# Patient Record
Sex: Male | Born: 1960 | Race: White | Hispanic: No | Marital: Married
Health system: Southern US, Community
[De-identification: ages and names within clinical notes are randomized; demographics above are authoritative.]

---

## 2015-12-11 ENCOUNTER — Inpatient Hospital Stay
Admission: RE | Admit: 2015-12-11 | Discharge: 2015-12-27 | Disposition: A | Discharge status: Rehabilitation Facility | Payer: BLUE CROSS/BLUE SHIELD | Source: Other Acute Inpatient Hospital | Attending: Internal Medicine | Admitting: Internal Medicine

## 2015-12-11 ENCOUNTER — Other Ambulatory Visit (HOSPITAL_COMMUNITY): Payer: BLUE CROSS/BLUE SHIELD

## 2015-12-11 DIAGNOSIS — J9690 Respiratory failure, unspecified, unspecified whether with hypoxia or hypercapnia: Secondary | ICD-10-CM

## 2015-12-11 DIAGNOSIS — J189 Pneumonia, unspecified organism: Secondary | ICD-10-CM

## 2015-12-11 DIAGNOSIS — Z931 Gastrostomy status: Secondary | ICD-10-CM

## 2015-12-11 DIAGNOSIS — R109 Unspecified abdominal pain: Secondary | ICD-10-CM

## 2015-12-11 MED ORDER — DIATRIZOATE MEGLUMINE & SODIUM 66-10 % PO SOLN
ORAL | Status: AC
  Administered 2015-12-11: 30 mL via ORAL
  Filled 2015-12-11: qty 30

## 2015-12-12 ENCOUNTER — Other Ambulatory Visit (HOSPITAL_COMMUNITY): Payer: BLUE CROSS/BLUE SHIELD

## 2015-12-12 LAB — BLOOD GAS, ARTERIAL
Acid-Base Excess: 3.7 mmol/L — ABNORMAL HIGH (ref 0.0–2.0)
Bicarbonate: 27.6 mmol/L (ref 20.0–28.0)
Drawn by: 290171
FIO2: 28
O2 Saturation: 97.9 %
PATIENT TEMPERATURE: 98.6
PH ART: 7.444 (ref 7.350–7.450)
PO2 ART: 104 mmHg (ref 83.0–108.0)
pCO2 arterial: 40.9 mmHg (ref 32.0–48.0)

## 2015-12-12 LAB — PROTIME-INR
INR: 1.13
PROTHROMBIN TIME: 14.5 s (ref 11.4–15.2)

## 2015-12-12 LAB — COMPREHENSIVE METABOLIC PANEL
ALK PHOS: 197 U/L — AB (ref 38–126)
ALT: 185 U/L — ABNORMAL HIGH (ref 17–63)
ANION GAP: 8 (ref 5–15)
AST: 84 U/L — ABNORMAL HIGH (ref 15–41)
Albumin: 2 g/dL — ABNORMAL LOW (ref 3.5–5.0)
BUN: 17 mg/dL (ref 6–20)
CALCIUM: 8.4 mg/dL — AB (ref 8.9–10.3)
CHLORIDE: 99 mmol/L — AB (ref 101–111)
CO2: 28 mmol/L (ref 22–32)
CREATININE: 0.59 mg/dL — AB (ref 0.61–1.24)
Glucose, Bld: 189 mg/dL — ABNORMAL HIGH (ref 65–99)
Potassium: 4.5 mmol/L (ref 3.5–5.1)
SODIUM: 135 mmol/L (ref 135–145)
Total Bilirubin: 0.4 mg/dL (ref 0.3–1.2)
Total Protein: 7 g/dL (ref 6.5–8.1)

## 2015-12-12 LAB — CBC
HCT: 29.6 % — ABNORMAL LOW (ref 39.0–52.0)
HEMOGLOBIN: 9 g/dL — AB (ref 13.0–17.0)
MCH: 26.9 pg (ref 26.0–34.0)
MCHC: 30.4 g/dL (ref 30.0–36.0)
MCV: 88.4 fL (ref 78.0–100.0)
PLATELETS: 341 10*3/uL (ref 150–400)
RBC: 3.35 MIL/uL — AB (ref 4.22–5.81)
RDW: 14.4 % (ref 11.5–15.5)
WBC: 10 10*3/uL (ref 4.0–10.5)

## 2015-12-13 ENCOUNTER — Other Ambulatory Visit (HOSPITAL_COMMUNITY): Payer: BLUE CROSS/BLUE SHIELD

## 2015-12-14 ENCOUNTER — Other Ambulatory Visit (HOSPITAL_COMMUNITY): Payer: BLUE CROSS/BLUE SHIELD

## 2015-12-14 LAB — BASIC METABOLIC PANEL
Anion gap: 8 (ref 5–15)
BUN: 15 mg/dL (ref 6–20)
CO2: 31 mmol/L (ref 22–32)
Calcium: 8.3 mg/dL — ABNORMAL LOW (ref 8.9–10.3)
Chloride: 96 mmol/L — ABNORMAL LOW (ref 101–111)
Creatinine, Ser: 0.71 mg/dL (ref 0.61–1.24)
GFR calc Af Amer: >60 mL/min (ref >60)
GFR calc non Af Amer: >60 mL/min (ref >60)
Glucose, Bld: 215 mg/dL — ABNORMAL HIGH (ref 65–99)
Potassium: 4.2 mmol/L (ref 3.5–5.1)
Sodium: 135 mmol/L (ref 135–145)

## 2015-12-14 LAB — CBC
HCT: 30.3 % — ABNORMAL LOW (ref 39.0–52.0)
HEMOGLOBIN: 9.1 g/dL — AB (ref 13.0–17.0)
MCH: 26.8 pg (ref 26.0–34.0)
MCHC: 30 g/dL (ref 30.0–36.0)
MCV: 89.4 fL (ref 78.0–100.0)
Platelets: 396 10*3/uL (ref 150–400)
RBC: 3.39 MIL/uL — ABNORMAL LOW (ref 4.22–5.81)
RDW: 14.8 % (ref 11.5–15.5)
WBC: 9.9 10*3/uL (ref 4.0–10.5)

## 2015-12-14 LAB — MAGNESIUM: MAGNESIUM: 2.2 mg/dL (ref 1.7–2.4)

## 2015-12-14 LAB — PHOSPHORUS: PHOSPHORUS: 4.2 mg/dL (ref 2.5–4.6)

## 2015-12-16 LAB — BASIC METABOLIC PANEL
ANION GAP: 9 (ref 5–15)
BUN: 11 mg/dL (ref 6–20)
CALCIUM: 8.7 mg/dL — AB (ref 8.9–10.3)
CO2: 29 mmol/L (ref 22–32)
Chloride: 93 mmol/L — ABNORMAL LOW (ref 101–111)
Creatinine, Ser: 0.71 mg/dL (ref 0.61–1.24)
GLUCOSE: 194 mg/dL — AB (ref 65–99)
Potassium: 4.4 mmol/L (ref 3.5–5.1)
SODIUM: 131 mmol/L — AB (ref 135–145)

## 2015-12-16 LAB — CBC
HCT: 31.8 % — ABNORMAL LOW (ref 39.0–52.0)
Hemoglobin: 9.8 g/dL — ABNORMAL LOW (ref 13.0–17.0)
MCH: 26.9 pg (ref 26.0–34.0)
MCHC: 30.8 g/dL (ref 30.0–36.0)
MCV: 87.4 fL (ref 78.0–100.0)
PLATELETS: 366 10*3/uL (ref 150–400)
RBC: 3.64 MIL/uL — AB (ref 4.22–5.81)
RDW: 14.9 % (ref 11.5–15.5)
WBC: 9.9 10*3/uL (ref 4.0–10.5)

## 2015-12-16 LAB — MAGNESIUM: MAGNESIUM: 2 mg/dL (ref 1.7–2.4)

## 2015-12-17 LAB — PHOSPHORUS: Phosphorus: 4.6 mg/dL (ref 2.5–4.6)

## 2015-12-17 LAB — CBC WITH DIFFERENTIAL/PLATELET
BASOS ABS: 0 10*3/uL (ref 0.0–0.1)
Basophils Relative: 0 %
EOS PCT: 2 %
Eosinophils Absolute: 0.2 10*3/uL (ref 0.0–0.7)
HCT: 32.9 % — ABNORMAL LOW (ref 39.0–52.0)
Hemoglobin: 10.2 g/dL — ABNORMAL LOW (ref 13.0–17.0)
LYMPHS ABS: 2.7 10*3/uL (ref 0.7–4.0)
LYMPHS PCT: 25 %
MCH: 27.1 pg (ref 26.0–34.0)
MCHC: 31 g/dL (ref 30.0–36.0)
MCV: 87.3 fL (ref 78.0–100.0)
MONO ABS: 0.8 10*3/uL (ref 0.1–1.0)
Monocytes Relative: 7 %
Neutro Abs: 7 10*3/uL (ref 1.7–7.7)
Neutrophils Relative %: 66 %
PLATELETS: 409 10*3/uL — AB (ref 150–400)
RBC: 3.77 MIL/uL — ABNORMAL LOW (ref 4.22–5.81)
RDW: 14.8 % (ref 11.5–15.5)
WBC: 10.7 10*3/uL — ABNORMAL HIGH (ref 4.0–10.5)

## 2015-12-17 LAB — BASIC METABOLIC PANEL
Anion gap: 9 (ref 5–15)
BUN: 18 mg/dL (ref 6–20)
CALCIUM: 8.6 mg/dL — AB (ref 8.9–10.3)
CO2: 28 mmol/L (ref 22–32)
CREATININE: 0.67 mg/dL (ref 0.61–1.24)
Chloride: 94 mmol/L — ABNORMAL LOW (ref 101–111)
GFR calc Af Amer: >60 mL/min (ref >60)
GLUCOSE: 221 mg/dL — AB (ref 65–99)
Potassium: 4.4 mmol/L (ref 3.5–5.1)
Sodium: 131 mmol/L — ABNORMAL LOW (ref 135–145)

## 2015-12-17 LAB — MAGNESIUM: Magnesium: 2.2 mg/dL (ref 1.7–2.4)

## 2015-12-18 ENCOUNTER — Other Ambulatory Visit (HOSPITAL_COMMUNITY): Payer: BLUE CROSS/BLUE SHIELD

## 2015-12-18 LAB — CBC WITH DIFFERENTIAL/PLATELET
BASOS ABS: 0 10*3/uL (ref 0.0–0.1)
BASOS PCT: 0 %
EOS PCT: 2 %
Eosinophils Absolute: 0.3 10*3/uL (ref 0.0–0.7)
HEMATOCRIT: 35.1 % — AB (ref 39.0–52.0)
Hemoglobin: 10.9 g/dL — ABNORMAL LOW (ref 13.0–17.0)
LYMPHS PCT: 26 %
Lymphs Abs: 3 10*3/uL (ref 0.7–4.0)
MCH: 27.2 pg (ref 26.0–34.0)
MCHC: 31.1 g/dL (ref 30.0–36.0)
MCV: 87.5 fL (ref 78.0–100.0)
Monocytes Absolute: 0.8 10*3/uL (ref 0.1–1.0)
Monocytes Relative: 7 %
NEUTROS ABS: 7.6 10*3/uL (ref 1.7–7.7)
Neutrophils Relative %: 65 %
PLATELETS: 421 10*3/uL — AB (ref 150–400)
RBC: 4.01 MIL/uL — AB (ref 4.22–5.81)
RDW: 14.8 % (ref 11.5–15.5)
WBC: 11.8 10*3/uL — AB (ref 4.0–10.5)

## 2015-12-18 LAB — BASIC METABOLIC PANEL
ANION GAP: 8 (ref 5–15)
BUN: 17 mg/dL (ref 6–20)
CO2: 31 mmol/L (ref 22–32)
Calcium: 9.1 mg/dL (ref 8.9–10.3)
Chloride: 96 mmol/L — ABNORMAL LOW (ref 101–111)
Creatinine, Ser: 0.67 mg/dL (ref 0.61–1.24)
GLUCOSE: 110 mg/dL — AB (ref 65–99)
POTASSIUM: 4.8 mmol/L (ref 3.5–5.1)
Sodium: 135 mmol/L (ref 135–145)

## 2015-12-18 LAB — MAGNESIUM: Magnesium: 2.1 mg/dL (ref 1.7–2.4)

## 2015-12-18 LAB — PHOSPHORUS: PHOSPHORUS: 4.1 mg/dL (ref 2.5–4.6)

## 2015-12-19 LAB — CULTURE, RESPIRATORY W GRAM STAIN

## 2015-12-19 LAB — CULTURE, RESPIRATORY

## 2015-12-24 LAB — CBC WITH DIFFERENTIAL/PLATELET
BASOS PCT: 0 %
Basophils Absolute: 0 10*3/uL (ref 0.0–0.1)
Eosinophils Absolute: 0.3 10*3/uL (ref 0.0–0.7)
Eosinophils Relative: 4 %
HEMATOCRIT: 33.7 % — AB (ref 39.0–52.0)
Hemoglobin: 10.5 g/dL — ABNORMAL LOW (ref 13.0–17.0)
LYMPHS PCT: 36 %
Lymphs Abs: 2.8 10*3/uL (ref 0.7–4.0)
MCH: 27.2 pg (ref 26.0–34.0)
MCHC: 31.2 g/dL (ref 30.0–36.0)
MCV: 87.3 fL (ref 78.0–100.0)
MONO ABS: 0.6 10*3/uL (ref 0.1–1.0)
MONOS PCT: 7 %
NEUTROS ABS: 4.1 10*3/uL (ref 1.7–7.7)
Neutrophils Relative %: 53 %
Platelets: 329 10*3/uL (ref 150–400)
RBC: 3.86 MIL/uL — ABNORMAL LOW (ref 4.22–5.81)
RDW: 15.4 % (ref 11.5–15.5)
WBC: 7.8 10*3/uL (ref 4.0–10.5)

## 2015-12-24 LAB — BASIC METABOLIC PANEL
Anion gap: 9 (ref 5–15)
BUN: 19 mg/dL (ref 6–20)
CHLORIDE: 93 mmol/L — AB (ref 101–111)
CO2: 29 mmol/L (ref 22–32)
CREATININE: 0.75 mg/dL (ref 0.61–1.24)
Calcium: 9 mg/dL (ref 8.9–10.3)
GFR calc Af Amer: >60 mL/min (ref >60)
GFR calc non Af Amer: >60 mL/min (ref >60)
Glucose, Bld: 189 mg/dL — ABNORMAL HIGH (ref 65–99)
Potassium: 5 mmol/L (ref 3.5–5.1)
Sodium: 131 mmol/L — ABNORMAL LOW (ref 135–145)

## 2015-12-24 LAB — PHOSPHORUS: Phosphorus: 4.9 mg/dL — ABNORMAL HIGH (ref 2.5–4.6)

## 2015-12-24 LAB — MAGNESIUM: Magnesium: 2 mg/dL (ref 1.7–2.4)

## 2015-12-26 LAB — BASIC METABOLIC PANEL
ANION GAP: 9 (ref 5–15)
BUN: 20 mg/dL (ref 6–20)
CALCIUM: 8.8 mg/dL — AB (ref 8.9–10.3)
CO2: 25 mmol/L (ref 22–32)
Chloride: 95 mmol/L — ABNORMAL LOW (ref 101–111)
Creatinine, Ser: 0.8 mg/dL (ref 0.61–1.24)
GFR calc Af Amer: >60 mL/min (ref >60)
Glucose, Bld: 185 mg/dL — ABNORMAL HIGH (ref 65–99)
POTASSIUM: 4.8 mmol/L (ref 3.5–5.1)
SODIUM: 129 mmol/L — AB (ref 135–145)

## 2015-12-26 LAB — CBC
HCT: 34.8 % — ABNORMAL LOW (ref 39.0–52.0)
Hemoglobin: 10.8 g/dL — ABNORMAL LOW (ref 13.0–17.0)
MCH: 26.7 pg (ref 26.0–34.0)
MCHC: 31 g/dL (ref 30.0–36.0)
MCV: 86.1 fL (ref 78.0–100.0)
PLATELETS: 304 10*3/uL (ref 150–400)
RBC: 4.04 MIL/uL — AB (ref 4.22–5.81)
RDW: 15.2 % (ref 11.5–15.5)
WBC: 8 10*3/uL (ref 4.0–10.5)

## 2015-12-26 LAB — MAGNESIUM: MAGNESIUM: 2 mg/dL (ref 1.7–2.4)

## 2015-12-26 LAB — PHOSPHORUS: Phosphorus: 4.9 mg/dL — ABNORMAL HIGH (ref 2.5–4.6)

## 2015-12-26 NOTE — PMR Pre-admission (Signed)
Secondary Market PMR Admission Coordinator Pre-Admission Assessment  Patient: James Holden is an 55 y.o., male MRN: [id number] DOB: Sep 10, 1960 Height: 5' 9"  (175.3 cm) Weight: 91.5 kg (201 lb 11.2 oz)  Insurance Information HMO:     PPO: Yes     PCP:       IPA:       80/20:       OTHER:   PRIMARY: Rickey Primus      Policy#: [id number]      Subscriber: Brett Albino CM Name: Durene Fruits      Phone#: [phone]     Fax#: [phone] Pre-Cert#: [id number] X 14 days      Employer: FT truck driver locally Benefits:  Phone #: [phone]     Name: Wendy Poet. Date: 02/17/15     Deduct:  $1100 (met $1100)      Out of Pocket Max:  $5500 (met $3321.25)      Life Max: Unlimited CIR: 80%      SNF: 80% with 60 days max  Outpatient: 80% with 60 visits combined     Co-Pay: 20% Home Health: 80%      Co-Pay: 20% DME: 80%     Co-Pay: 20% Providers: in network  SECONDARY: Aet[employer]      Policy#: [id number]      Subscriber:  Veneda Melter CM Name:        Phone#:       Fax#:   Pre-Cert#: Not required with BCBS primary      Employer:  FT paralegal Benefits:  Phone #: [phone]     Name: Delice Lesch. Date: 10/18/15     Deduct: $0      Out of Pocket Max: $3000 (met $454.53)      Life Max: None CIR: 80%      SNF: 80% Outpatient: 90 visits combined     Co-Pay: $20/visit Home Health: 80% with 120 visits max      Co-Pay: 20% DME: 80%     Co-Pay: 62%  Medicaid Application Date:        Case Manager:   Disability Application Date:        Case Worker:    Emergency Contact Information Contact Information    Name Relation Home Work Mobile   Cerrito,Rebecca Spouse [phone]     [facility] Son   [phone]      Current Medical History  Patient Admitting Diagnosis:  R MCA infarct w/craniotomy  History of Present Illness: A 56 yo male initially presented to the emergency department via EMS at [facility] on 11/14/15 with a large right MCA stroke.  Initial NIH stroke scale was 16.  He received IV TPA  immediately after his initial noncontrast head CT was negative for hemorrhagic stroke.  Shortly after TPA, he was transferred to interventional radiology where they performed a successful embolectomy.  He was on a cardene drip in the ICU for hypertensive emergency.  He developed an aspiration pneumonia requiring intubation, mechanical ventilation as well as cerebral edema.  He underwent a hemicraniotomy on 11/15/15.  He was started on hypertonic saline for his cerebral edema.  He was trached on 12/03/15 and PEG was placed on 12/05/15.  He was weaned from the vent to trach collar on 12/09/15.  Patient was admitted to [facility] on 12/11/15.  He was decannulated on 12/25/15.  Nocturnal tube feedings were discontinued 12/26/15 and he is currently on a dysphagia 2, nectar thick liquids diet.  He was evaluated by  PT/OT/SLP with ongoing treatments.  Therapies have recommended acute inpatient rehab admission to further his rehabilitation.  NIH stroke scale=initially 16  Patient's medical record from [facility] has been reviewed by the rehabilitation admission coordinator and physician.  Past Medical History  Depression Obesity Type 2 diabetes Hypertension Tobacco use Chronic low back pain  Family History   family history is not on file.  Prior Rehab/Hospitalizations Has the patient had major surgery during 100 days prior to admission? No   Current Medications See MAR from [facility]  Patients Current Diet:   Dysphagia 2, nectar thick liquids  Precautions / Restrictions Precautions Precautions: Fall Precautions/Special Needs: Swallowing   Has the patient had 2 or more falls or a fall with injury in the past year?No  Prior Activity Level Community (5-7x/wk): Went out daily.  Worked FT as a Engineer, drilling.  Likes to play music with his son.  Prior Functional Level Self Care: Did the patient need help bathing, dressing, using the toilet or  eating?  Independent  Indoor Mobility: Did the patient need assistance with walking from room to room (with or without device)? Independent  Stairs: Did the patient need assistance with internal or external stairs (with or without device)? Independent  Functional Cognition: Did the patient need help planning regular tasks such as shopping or remembering to take medications? Independent  Home Assistive Devices / Equipment Home Assistive Devices/Equipment: None  Prior Device Use: Indicate devices/aids used by the patient prior to current illness, exacerbation or injury? None   Prior Functional Level Current Functional Level  Bed Mobility  Independent  Max assist   Transfers  Independent  Max assist   Mobility - Walk/Wheelchair  Independent  Total assist   Upper Body Dressing  Independent      Lower Body Dressing  Independent      Grooming  Independent      Eating/Drinking  Independent  Max assist   Toilet Transfer  Independent      Bladder Continence   WDL  Incontinent, condom catheter in place   Bowel Management  WDL  Last BM 12/25/15   Stair Climbing  Independent Other (Unable)   Communication  Verbally intact  Speaks slowly   Memory  Intact  Mild impairment   Cooking/Meal Prep  Independent      Housework  Independent    Money Management  Independent    Driving  Independent     Special needs/care consideration BiPAP/CPAP No CPM No Continuous Drip IV No Dialysis No      Life Vest No Oxygen No Special Bed No Trach Size Decannulated 12/25/15 Wound Vac (area) No      Skin: Scalp craniotomy incision.  Bottom is sore.  Wearing a safety helmet post hemicraniotomy.                             Bowel mgmt: Last BM 12/25/15 Bladder mgmt: Incontinent, condom catheter in use Diabetic mgmt Yes, new diabetic this admission  Previous Home Environment Living Arrangements: Spouse/significant other  Lives With: Spouse Available Help at  Discharge: Family, Available 24 hours/day Type of Home: House Home Layout: One level Home Access: Stairs to enter [employer] of Steps: 4 steps, can build ramp as needed  Discharge Living Setting Plans for Discharge Living Setting: Patient's home, House, Lives with (comment) (Lives with wife.  Son has moved back home.) Type of Home at Discharge: House Discharge Home Layout:  One level Discharge Home Access: Stairs to enter Entrance Stairs-Number of Steps: 4 steps, can build ramp as needed Does the patient have any problems obtaining your medications?: No  Social/Family/Support Systems Patient Roles: Spouse, Parent (Has a wife and a son.) Contact Information: Gerold Sar - wife Anticipated Caregiver: wife, son and a good friend retired from [place]: Wells Guiles - wife - [phone] Ability/Limitations of Caregiver: Wife works as a Radio broadcast assistant. Wife had kidney cancer earlier this year.  Son moved back from [place] and will be working.  Retired Nature conservation officer friend has offered to stay with patient while wife works post rehab. Caregiver Availability: 24/7 Discharge Plan Discussed with Primary Caregiver: Yes Is Caregiver In Agreement with Plan?: Yes Does Caregiver/Family have Issues with Lodging/Transportation while Pt is in Rehab?: No  Goals/Additional Needs Patient/Family Goal for Rehab: PT/OT min to mod assist goals, SLP supervision to min assist goals Expected length of stay: 21-24 days Cultural Considerations: [employer] Dietary Needs: Dys 2, nectar thick liquids Equipment Needs: TBD Special Service Needs: Skull flap removed for swelling and will need to be replaced at [facility] later on. Pt/Family Agrees to Admission and willing to participate: Yes (Met with son and wife on rehab unit.) Program Orientation Provided & Reviewed with Pt/Caregiver Including Roles  & Responsibilities: Yes (to wife and son.)  Patient Condition: I have  reviewed all notes from [facility].  I met with wife and son.  Patient has suffered a large MCA CVA with post op craniotomy for cerebral edema.  He will benefit from the coordinated services of the inpatient rehab program.  He needs close medical follow up by rehab MD secondary to ongoing dizziness, dysphagia, and new diagnosis of diabetes.  He can tolerate and is able to participate in 3 hours of therapy a day.  He is motivated and wants to regain functional independence so that he can return home with family.  I have discussed all progress with rehab MD and have approval for acute inpatient rehab admission for today.  Preadmission Screen Completed By:  Retta Diones, 12/27/2015 10:13 AM ______________________________________________________________________   Discussed status with Dr. Naaman Plummer on 12/27/15 at 50 and received telephone approval for admission today.  Admission Coordinator:  Retta Diones, time 1013/Date 12/27/15   Assessment/Plan: Diagnosis: large right MCA infarct 1. Does the need for close, 24 hr/day  Medical supervision in concert with the patient's rehab needs make it unreasonable for this patient to be served in a less intensive setting? Yes 2. Co-Morbidities requiring supervision/potential complications: depression, DM2, dsyphagia, htn, chronic pain 3. Due to bladder management, bowel management, safety, skin/wound care, disease management, medication administration, pain management and patient education, does the patient require 24 hr/day rehab nursing? Yes 4. Does the patient require coordinated care of a physician, rehab nurse, PT (1-2 hrs/day, 5 days/week), OT (1-2 hrs/day, 5 days/week) and SLP (1-2 hrs/day, 5 days/week) to address physical and functional deficits in the context of the above medical diagnosis(es)? Yes Addressing deficits in the following areas: balance, endurance, locomotion, strength, transferring, bowel/bladder control, bathing, dressing, feeding,  grooming, toileting, cognition, swallowing and psychosocial support 5. Can the patient actively participate in an intensive therapy program of at least 3 hrs of therapy 5 days a week? Yes 6. The potential for patient to make measurable gains while on inpatient rehab is excellent 7. Anticipated functional outcomes upon discharge from inpatients are: min assist and mod assist PT, min assist and mod assist OT, supervision SLP 8. Estimated rehab  length of stay to reach the above functional goals is: 22-25 days 9. Does the patient have adequate social supports to accommodate these discharge functional goals? Yes 10. Anticipated D/C setting: Home 11. Anticipated post D/C treatments: HH therapy and Outpatient therapy 12. Overall Rehab/Functional Prognosis: excellent    RECOMMENDATIONS: This patient's condition is appropriate for continued rehabilitative care in the following setting: CIR Patient has agreed to participate in recommended program. Yes Note that insurance prior authorization may be required for reimbursement for recommended care.  Comment: Admit to inpatient rehab today.  Meredith Staggers, MD, [facility] 12/27/2015   Retta Diones 12/27/2015

## 2015-12-27 ENCOUNTER — Inpatient Hospital Stay (HOSPITAL_COMMUNITY)
Admission: RE | Admit: 2015-12-27 | Discharge: 2016-01-28 | DRG: 092 | Disposition: A | Discharge status: Home Health | Payer: BLUE CROSS/BLUE SHIELD | Source: Intra-hospital | Attending: Physical Medicine & Rehabilitation | Admitting: Physical Medicine & Rehabilitation

## 2015-12-27 ENCOUNTER — Other Ambulatory Visit (HOSPITAL_COMMUNITY): Payer: Self-pay | Admitting: Physician Assistant

## 2015-12-27 DIAGNOSIS — E871 Hypo-osmolality and hyponatremia: Secondary | ICD-10-CM | POA: Diagnosis present

## 2015-12-27 DIAGNOSIS — R1312 Dysphagia, oropharyngeal phase: Secondary | ICD-10-CM

## 2015-12-27 DIAGNOSIS — M25532 Pain in left wrist: Secondary | ICD-10-CM | POA: Diagnosis present

## 2015-12-27 DIAGNOSIS — G8112 Spastic hemiplegia affecting left dominant side: Secondary | ICD-10-CM | POA: Diagnosis not present

## 2015-12-27 DIAGNOSIS — Z720 Tobacco use: Secondary | ICD-10-CM | POA: Diagnosis not present

## 2015-12-27 DIAGNOSIS — F4321 Adjustment disorder with depressed mood: Secondary | ICD-10-CM | POA: Diagnosis present

## 2015-12-27 DIAGNOSIS — I491 Atrial premature depolarization: Secondary | ICD-10-CM | POA: Diagnosis not present

## 2015-12-27 DIAGNOSIS — G4700 Insomnia, unspecified: Secondary | ICD-10-CM | POA: Diagnosis present

## 2015-12-27 DIAGNOSIS — G8194 Hemiplegia, unspecified affecting left nondominant side: Secondary | ICD-10-CM | POA: Diagnosis not present

## 2015-12-27 DIAGNOSIS — E1142 Type 2 diabetes mellitus with diabetic polyneuropathy: Secondary | ICD-10-CM | POA: Diagnosis present

## 2015-12-27 DIAGNOSIS — F339 Major depressive disorder, recurrent, unspecified: Secondary | ICD-10-CM

## 2015-12-27 DIAGNOSIS — I69398 Other sequelae of cerebral infarction: Secondary | ICD-10-CM | POA: Diagnosis not present

## 2015-12-27 DIAGNOSIS — R443 Hallucinations, unspecified: Secondary | ICD-10-CM | POA: Diagnosis not present

## 2015-12-27 DIAGNOSIS — M7989 Other specified soft tissue disorders: Secondary | ICD-10-CM | POA: Diagnosis not present

## 2015-12-27 DIAGNOSIS — M545 Low back pain: Secondary | ICD-10-CM | POA: Diagnosis present

## 2015-12-27 DIAGNOSIS — R2689 Other abnormalities of gait and mobility: Secondary | ICD-10-CM | POA: Diagnosis present

## 2015-12-27 DIAGNOSIS — R414 Neurologic neglect syndrome: Secondary | ICD-10-CM | POA: Diagnosis present

## 2015-12-27 DIAGNOSIS — J029 Acute pharyngitis, unspecified: Secondary | ICD-10-CM | POA: Diagnosis not present

## 2015-12-27 DIAGNOSIS — Z931 Gastrostomy status: Secondary | ICD-10-CM

## 2015-12-27 DIAGNOSIS — I69354 Hemiplegia and hemiparesis following cerebral infarction affecting left non-dominant side: Secondary | ICD-10-CM

## 2015-12-27 DIAGNOSIS — I63511 Cerebral infarction due to unspecified occlusion or stenosis of right middle cerebral artery: Secondary | ICD-10-CM | POA: Diagnosis present

## 2015-12-27 DIAGNOSIS — I69391 Dysphagia following cerebral infarction: Secondary | ICD-10-CM | POA: Diagnosis not present

## 2015-12-27 DIAGNOSIS — M79609 Pain in unspecified limb: Secondary | ICD-10-CM | POA: Diagnosis not present

## 2015-12-27 DIAGNOSIS — G2581 Restless legs syndrome: Secondary | ICD-10-CM | POA: Diagnosis present

## 2015-12-27 DIAGNOSIS — R1310 Dysphagia, unspecified: Secondary | ICD-10-CM | POA: Diagnosis present

## 2015-12-27 DIAGNOSIS — E785 Hyperlipidemia, unspecified: Secondary | ICD-10-CM | POA: Diagnosis present

## 2015-12-27 DIAGNOSIS — G8929 Other chronic pain: Secondary | ICD-10-CM | POA: Diagnosis present

## 2015-12-27 DIAGNOSIS — R269 Unspecified abnormalities of gait and mobility: Secondary | ICD-10-CM | POA: Diagnosis not present

## 2015-12-27 DIAGNOSIS — M79642 Pain in left hand: Secondary | ICD-10-CM | POA: Diagnosis present

## 2015-12-27 DIAGNOSIS — E119 Type 2 diabetes mellitus without complications: Secondary | ICD-10-CM | POA: Diagnosis not present

## 2015-12-27 DIAGNOSIS — I10 Essential (primary) hypertension: Secondary | ICD-10-CM | POA: Diagnosis not present

## 2015-12-27 DIAGNOSIS — I959 Hypotension, unspecified: Secondary | ICD-10-CM | POA: Diagnosis not present

## 2015-12-27 LAB — GLUCOSE, CAPILLARY
GLUCOSE-CAPILLARY: 178 mg/dL — AB (ref 65–99)
Glucose-Capillary: 195 mg/dL — ABNORMAL HIGH (ref 65–99)

## 2015-12-27 LAB — MRSA PCR SCREENING: MRSA by PCR: NEGATIVE

## 2015-12-27 MED ORDER — POLYETHYLENE GLYCOL 3350 17 G PO PACK
17.0000 g | PACK | Freq: Every day | ORAL | Status: DC
  Administered 2015-12-28 – 2016-01-27 (×31): 17 g via ORAL
  Filled 2015-12-27 (×32): qty 1

## 2015-12-27 MED ORDER — LISINOPRIL 20 MG PO TABS
30.0000 mg | ORAL_TABLET | Freq: Every day | ORAL | Status: DC
  Administered 2015-12-28 – 2016-01-28 (×30): 30 mg via ORAL
  Filled 2015-12-27 (×32): qty 1

## 2015-12-27 MED ORDER — ADULT MULTIVITAMIN W/MINERALS CH
1.0000 | ORAL_TABLET | Freq: Every day | ORAL | Status: DC
  Administered 2015-12-28 – 2016-01-28 (×32): 1 via ORAL
  Filled 2015-12-27 (×32): qty 1

## 2015-12-27 MED ORDER — STARCH (THICKENING) PO POWD: ORAL | Status: DC | PRN

## 2015-12-27 MED ORDER — ATORVASTATIN CALCIUM 80 MG PO TABS
80.0000 mg | ORAL_TABLET | Freq: Every day | ORAL | Status: DC
  Administered 2015-12-27 – 2016-01-27 (×32): 80 mg via ORAL
  Filled 2015-12-27 (×32): qty 1

## 2015-12-27 MED ORDER — FAMOTIDINE 20 MG PO TABS
20.0000 mg | ORAL_TABLET | Freq: Two times a day (BID) | ORAL | Status: DC
  Administered 2015-12-27 – 2016-01-28 (×64): 20 mg via ORAL
  Filled 2015-12-27 (×65): qty 1

## 2015-12-27 MED ORDER — ONDANSETRON HCL 4 MG/2ML IJ SOLN: 4.0000 mg | Freq: Four times a day (QID) | INTRAMUSCULAR | Status: DC | PRN

## 2015-12-27 MED ORDER — HYDRALAZINE HCL 10 MG PO TABS
10.0000 mg | ORAL_TABLET | Freq: Three times a day (TID) | ORAL | Status: DC
  Administered 2015-12-27 – 2016-01-05 (×17): 10 mg via ORAL
  Filled 2015-12-27 (×25): qty 1

## 2015-12-27 MED ORDER — ARIPIPRAZOLE 5 MG PO TABS
5.0000 mg | ORAL_TABLET | Freq: Every day | ORAL | Status: DC
  Administered 2015-12-28 – 2016-01-28 (×32): 5 mg via ORAL
  Filled 2015-12-27 (×31): qty 1

## 2015-12-27 MED ORDER — RESOURCE THICKENUP CLEAR PO POWD
ORAL | Status: DC | PRN
Start: 2015-12-27 — End: 2016-01-28
  Filled 2015-12-27 (×2): qty 125

## 2015-12-27 MED ORDER — FLUOXETINE HCL 20 MG PO CAPS
40.0000 mg | ORAL_CAPSULE | Freq: Every day | ORAL | Status: DC
  Administered 2015-12-28 – 2016-01-28 (×32): 40 mg via ORAL
  Filled 2015-12-27 (×33): qty 2

## 2015-12-27 MED ORDER — ACETAMINOPHEN 325 MG PO TABS
325.0000 mg | ORAL_TABLET | ORAL | Status: DC | PRN
  Administered 2015-12-27 – 2016-01-23 (×13): 650 mg via ORAL
  Filled 2015-12-27 (×15): qty 2

## 2015-12-27 MED ORDER — OXYCODONE HCL 5 MG PO TABS
5.0000 mg | ORAL_TABLET | Freq: Four times a day (QID) | ORAL | Status: DC | PRN
  Administered 2015-12-27 – 2016-01-20 (×28): 5 mg via ORAL
  Filled 2015-12-27 (×33): qty 1

## 2015-12-27 MED ORDER — INSULIN ASPART 100 UNIT/ML ~~LOC~~ SOLN
0.0000 [IU] | Freq: Three times a day (TID) | SUBCUTANEOUS | Status: DC
  Administered 2015-12-27 – 2015-12-28 (×2): 3 [IU] via SUBCUTANEOUS
  Administered 2015-12-28 (×2): 5 [IU] via SUBCUTANEOUS
  Administered 2015-12-29 (×2): 3 [IU] via SUBCUTANEOUS
  Administered 2015-12-29: 5 [IU] via SUBCUTANEOUS
  Administered 2015-12-30 (×2): 3 [IU] via SUBCUTANEOUS
  Administered 2015-12-30: 2 [IU] via SUBCUTANEOUS
  Administered 2015-12-31 (×2): 3 [IU] via SUBCUTANEOUS
  Administered 2015-12-31 – 2016-01-01 (×3): 2 [IU] via SUBCUTANEOUS
  Administered 2016-01-01 – 2016-01-02 (×2): 3 [IU] via SUBCUTANEOUS
  Administered 2016-01-02: 2 [IU] via SUBCUTANEOUS
  Administered 2016-01-02 – 2016-01-05 (×7): 3 [IU] via SUBCUTANEOUS
  Administered 2016-01-05 – 2016-01-06 (×3): 2 [IU] via SUBCUTANEOUS
  Administered 2016-01-06 – 2016-01-07 (×2): 3 [IU] via SUBCUTANEOUS
  Administered 2016-01-08: 2 [IU] via SUBCUTANEOUS
  Administered 2016-01-08 – 2016-01-09 (×2): 3 [IU] via SUBCUTANEOUS
  Administered 2016-01-09 (×2): 2 [IU] via SUBCUTANEOUS
  Administered 2016-01-10 (×2): 3 [IU] via SUBCUTANEOUS
  Administered 2016-01-10: 2 [IU] via SUBCUTANEOUS
  Administered 2016-01-11: 3 [IU] via SUBCUTANEOUS
  Administered 2016-01-11 – 2016-01-12 (×3): 2 [IU] via SUBCUTANEOUS
  Administered 2016-01-12: 3 [IU] via SUBCUTANEOUS
  Administered 2016-01-13 – 2016-01-14 (×4): 2 [IU] via SUBCUTANEOUS
  Administered 2016-01-15: 3 [IU] via SUBCUTANEOUS
  Administered 2016-01-15 – 2016-01-17 (×5): 2 [IU] via SUBCUTANEOUS
  Administered 2016-01-17: 3 [IU] via SUBCUTANEOUS
  Administered 2016-01-18: 2 [IU] via SUBCUTANEOUS
  Administered 2016-01-18: 3 [IU] via SUBCUTANEOUS
  Administered 2016-01-18 – 2016-01-19 (×2): 2 [IU] via SUBCUTANEOUS
  Administered 2016-01-19: 3 [IU] via SUBCUTANEOUS
  Administered 2016-01-20: 2 [IU] via SUBCUTANEOUS
  Administered 2016-01-20 – 2016-01-23 (×9): 3 [IU] via SUBCUTANEOUS
  Administered 2016-01-24: 5 [IU] via SUBCUTANEOUS
  Administered 2016-01-24: 1 [IU] via SUBCUTANEOUS
  Administered 2016-01-24 – 2016-01-26 (×2): 3 [IU] via SUBCUTANEOUS
  Administered 2016-01-26: 2 [IU] via SUBCUTANEOUS
  Administered 2016-01-26 – 2016-01-27 (×2): 3 [IU] via SUBCUTANEOUS
  Administered 2016-01-27 – 2016-01-28 (×3): 2 [IU] via SUBCUTANEOUS

## 2015-12-27 MED ORDER — INSULIN GLARGINE 100 UNIT/ML ~~LOC~~ SOLN
13.0000 [IU] | Freq: Every day | SUBCUTANEOUS | Status: DC
  Administered 2015-12-27: 13 [IU] via SUBCUTANEOUS
  Filled 2015-12-27 (×2): qty 0.13

## 2015-12-27 MED ORDER — ONDANSETRON HCL 4 MG PO TABS
4.0000 mg | ORAL_TABLET | Freq: Four times a day (QID) | ORAL | Status: DC | PRN
  Administered 2015-12-27 – 2016-01-25 (×10): 4 mg via ORAL
  Filled 2015-12-27 (×10): qty 1

## 2015-12-27 MED ORDER — SORBITOL 70 % SOLN
30.0000 mL | Freq: Every day | Status: DC | PRN
  Administered 2016-01-11 – 2016-01-23 (×3): 30 mL via ORAL
  Filled 2015-12-27 (×3): qty 30

## 2015-12-27 MED ORDER — INSULIN ASPART 100 UNIT/ML ~~LOC~~ SOLN: 0.0000 [IU] | SUBCUTANEOUS | Status: DC

## 2015-12-27 MED ORDER — AMLODIPINE BESYLATE 10 MG PO TABS
10.0000 mg | ORAL_TABLET | Freq: Every day | ORAL | Status: DC
  Administered 2015-12-28 – 2016-01-28 (×25): 10 mg via ORAL
  Filled 2015-12-27 (×32): qty 1

## 2015-12-27 MED ORDER — HEPARIN SODIUM (PORCINE) 5000 UNIT/ML IJ SOLN
5000.0000 [IU] | Freq: Three times a day (TID) | INTRAMUSCULAR | Status: DC
  Administered 2015-12-27 – 2016-01-17 (×63): 5000 [IU] via SUBCUTANEOUS
  Filled 2015-12-27 (×64): qty 1

## 2015-12-27 MED ORDER — ASPIRIN 325 MG PO TABS
325.0000 mg | ORAL_TABLET | Freq: Every day | ORAL | Status: DC
  Administered 2015-12-28 – 2016-01-28 (×32): 325 mg via ORAL
  Filled 2015-12-27 (×32): qty 1

## 2015-12-27 NOTE — Progress Notes (Signed)
Meredith Staggers, MD Physician Signed Physical Medicine and Rehabilitation  PMR Pre-admission Date of Service: 12/26/2015 3:26 PM  Related encounter: Admission (Current) from 12/11/2015 in [facility]       _0 Hide copied text _1 Hover for attribution information   Secondary Market PMR Admission Coordinator Pre-Admission Assessment  Patient: James Holden is an 55 y.o., male MRN: [id number] DOB: 13-Oct-1960 Height: _2  (175.3 cm) Weight: 91.5 kg (201 lb 11.2 oz)  Insurance Information HMO:     PPO: Yes     PCP:       IPA:       80/20:       OTHER:   PRIMARYRickey Primus      Policy#: [id number]      Subscriber: Brett Albino CM Name: Durene Fruits      Phone#: [phone]     Fax#: [phone] Pre-Cert#: [id number] X 14 days      Employer: FT truck driver locally Benefits:  Phone #: [phone]     Name: Wendy Poet. Date: 02/17/15     Deduct:  $1100 (met $1100)      Out of Pocket Max:  $5500 (met $3321.25)      Life Max: Unlimited CIR: 80%      SNF: 80% with 60 days max  Outpatient: 80% with 60 visits combined     Co-Pay: 20% Home Health: 80%      Co-Pay: 20% DME: 80%     Co-Pay: 20% Providers: in network  SECONDARY: [employer]etna      Policy#: [id number]      Subscriber:  Veneda Melter CM Name:        Phone#:       Fax#:   Pre-Cert#: Not required with BCBS primary      Employer:  FT paralegal Benefits:  Phone #: [phone]     Name: Delice Lesch. Date: 10/18/15     Deduct: $0      Out of Pocket Max: $3000 (met $454.53)      Life Max: None CIR: 80%      SNF: 80% Outpatient: 90 visits combined     Co-Pay: $20/visit Home Health: 80% with 120 visits max      Co-Pay: 20% DME: 80%     Co-Pay: 55%  Medicaid Application Date:        Case Manager:   Disability Application Date:        Case Worker:    Emergency Contact Information        Contact Information    Name Relation Home Work Mobile   Hannen,Rebecca Spouse [phone]     [facility] Son   [phone]       Current Medical History  Patient Admitting Diagnosis:  R MCA infarct w/craniotomy  History of Present Illness: A 55 yo male initially presented to the emergency department via EMS at [facility] on 11/14/15 with a large right MCA stroke.  Initial NIH stroke scale was 16.  He received IV TPA immediately after his initial noncontrast head CT was negative for hemorrhagic stroke.  Shortly after TPA, he was transferred to interventional radiology where they performed a successful embolectomy.  He was on a cardene drip in the ICU for hypertensive emergency.  He developed an aspiration pneumonia requiring intubation, mechanical ventilation as well as cerebral edema.  He underwent a hemicraniotomy on 11/15/15.  He was started on hypertonic saline for his cerebral edema.  He was trached on 12/03/15 and PEG was placed on 12/05/15.  He was weaned from the vent to trach collar on 12/09/15.  Patient was admitted to [facility] on 12/11/15.  He was decannulated on 12/25/15.  Nocturnal tube feedings were discontinued 12/26/15 and he is currently on a dysphagia 2, nectar thick liquids diet.  He was evaluated by PT/OT/SLP with ongoing treatments.  Therapies have recommended acute inpatient rehab admission to further his rehabilitation.  NIH stroke scale=initially 16  Patient's medical record from [facility] has been reviewed by the rehabilitation admission coordinator and physician.  Past Medical History  Depression Obesity Type 2 diabetes Hypertension Tobacco use Chronic low back pain  Family History   family history is not on file.  Prior Rehab/Hospitalizations Has the patient had major surgery during 100 days prior to admission? No              Current Medications See MAR from [facility]  Patients Current Diet:   Dysphagia 2, nectar thick liquids  Precautions / Restrictions Precautions Precautions: Fall Precautions/Special Needs: Swallowing     Has the patient had 2 or more falls or a fall with injury in the past year?No  Prior Activity Level Community (5-7x/wk): Went out daily.  Worked FT as a Engineer, drilling.  Likes to play music with his son.  Prior Functional Level Self Care: Did the patient need help bathing, dressing, using the toilet or eating?  Independent  Indoor Mobility: Did the patient need assistance with walking from room to room (with or without device)? Independent  Stairs: Did the patient need assistance with internal or external stairs (with or without device)? Independent  Functional Cognition: Did the patient need help planning regular tasks such as shopping or remembering to take medications? Independent  Home Assistive Devices / Equipment Home Assistive Devices/Equipment: None  Prior Device Use: Indicate devices/aids used by the patient prior to current illness, exacerbation or injury? None   Prior Functional Level Current Functional Level  Bed Mobility Independent Max assist  Transfers Independent Max assist  Mobility - Walk/Wheelchair Independent Total assist  Upper Body Dressing Independent    Lower Body Dressing Independent    Grooming Independent    Eating/Drinking Independent Max assist  Toilet Transfer Independent    Bladder Continence  WDL Incontinent, condom catheter in place  Bowel Management WDL Last BM 12/25/15  Stair Climbing Independent Other (Unable)  Communication Verbally intact Speaks slowly  Memory Intact Mild impairment  Cooking/Meal Prep Independent     Housework Independent   Money Management Independent   Driving Independent     Special needs/care consideration BiPAP/CPAP No CPM No Continuous Drip IV No Dialysis No      Life Vest No Oxygen No Special Bed No Trach Size Decannulated 12/25/15 Wound Vac (area) No      Skin: Scalp craniotomy incision.  Bottom is sore.  Wearing a safety helmet post hemicraniotomy.                              Bowel mgmt: Last BM 12/25/15 Bladder mgmt: Incontinent, condom catheter in use Diabetic mgmt Yes, new diabetic this admission  Previous Home Environment Living Arrangements: Spouse/significant other  Lives With: Spouse Available Help at Discharge: Family, Available 24 hours/day Type of Home: House Home Layout: One level Home Access: Stairs to enter [employer] of Steps: 4 steps, can build ramp as needed  Discharge Living Setting Plans for Discharge Living Setting: Patient's home, House, Lives with (comment) (  Lives with wife.  Son has moved back home.) Type of Home at Discharge: House Discharge Home Layout: One level Discharge Home Access: Stairs to enter Entrance Stairs-Number of Steps: 4 steps, can build ramp as needed Does the patient have any problems obtaining your medications?: No  Social/Family/Support Systems Patient Roles: Spouse, Parent (Has a wife and a son.) Contact Information: Trevis Eden - wife Anticipated Caregiver: wife, son and a good friend retired from [employer]: Wells Guiles - wife - [phone] Ability/Limitations of Caregiver: Wife works as a Radio broadcast assistant. Wife had kidney cancer earlier this year.  Son moved back from [place] and will be working.  Retired Nature conservation officer friend has offered to stay with patient while wife works post rehab. Caregiver Availability: 24/7 Discharge Plan Discussed with Primary Caregiver: Yes Is Caregiver In Agreement with Plan?: Yes Does Caregiver/Family have Issues with Lodging/Transportation while Pt is in Rehab?: No  Goals/Additional Needs Patient/Family Goal for Rehab: PT/OT min to mod assist goals, SLP supervision to min assist goals Expected length of stay: 21-24 days Cultural Considerations: [employer] Dietary Needs: Dys 2, nectar thick liquids Equipment Needs: TBD Special Service Needs: Skull flap removed for swelling and will need to be replaced at [facility] later  on. Pt/Family Agrees to Admission and willing to participate: Yes (Met with son and wife on rehab unit.) Program Orientation Provided & Reviewed with Pt/Caregiver Including Roles  & Responsibilities: Yes (to wife and son.)  Patient Condition: I have reviewed [facility].  I met with wife and son.  Patient has suffered a large MCA CVA with post op craniotomy for cerebral edema.  He will benefit from the coordinated services of the inpatient rehab program.  He needs close medical follow up by rehab MD secondary to ongoing dizziness, dysphagia, and new diagnosis of diabetes.  He can tolerate and is able to participate in 3 hours of therapy a day.  He is motivated and wants to regain functional independence so that he can return home with family.  I have discussed all progress with rehab MD and have approval for acute inpatient rehab admission for today.  Preadmission Screen Completed By:  Retta Diones, 12/27/2015 10:13 AM ______________________________________________________________________   Discussed status with Dr. Naaman Plummer on 12/27/15 at 12 and received telephone approval for admission today.  Admission Coordinator:  Retta Diones, time 1013/Date 12/27/15   Assessment/Plan: Diagnosis: large right MCA infarct 1. Does the need for close, 24 hr/day  Medical supervision in concert with the patient's rehab needs make it unreasonable for this patient to be served in a less intensive setting? Yes 2. Co-Morbidities requiring supervision/potential complications: depression, DM2, dsyphagia, htn, chronic pain 3. Due to bladder management, bowel management, safety, skin/wound care, disease management, medication administration, pain management and patient education, does the patient require 24 hr/day rehab nursing? Yes 4. Does the patient require coordinated care of a physician, rehab nurse, PT (1-2 hrs/day, 5 days/week), OT (1-2 hrs/day, 5 days/week) and SLP (1-2 hrs/day, 5  days/week) to address physical and functional deficits in the context of the above medical diagnosis(es)? Yes Addressing deficits in the following areas: balance, endurance, locomotion, strength, transferring, bowel/bladder control, bathing, dressing, feeding, grooming, toileting, cognition, swallowing and psychosocial support 5. Can the patient actively participate in an intensive therapy program of at least 3 hrs of therapy 5 days a week? Yes 6. The potential for patient to make measurable gains while on inpatient rehab is excellent 7. Anticipated functional outcomes upon discharge from inpatients  are: min assist and mod assist PT, min assist and mod assist OT, supervision SLP 8. Estimated rehab length of stay to reach the above functional goals is: 22-25 days 9. Does the patient have adequate social supports to accommodate these discharge functional goals? Yes 10. Anticipated D/C setting: Home 11. Anticipated post D/C treatments: HH therapy and Outpatient therapy 12. Overall Rehab/Functional Prognosis: excellent    RECOMMENDATIONS: This patient's condition is appropriate for continued rehabilitative care in the following setting: CIR Patient has agreed to participate in recommended program. Yes Note that insurance prior authorization may be required for reimbursement for recommended care.  Comment: Admit to inpatient rehab today.  Meredith Staggers, MD, [place] [facility] 12/27/2015   Retta Diones 12/27/2015    Revision History

## 2015-12-27 NOTE — H&P (Signed)
Physical Medicine and Rehabilitation Admission H&P    Chief complaint: Left-sided weakness   HPI: 55 year old right handed Caucasian male with history of depression, type 2 diabetes mellitus, hypertension, tobacco abuse and chronic low back pain as he was taking oxycodone immediate release prior to admission at home. He is married and lives with his wife in [place]. His son is planning to move in with them. Patient is  Employed as a Administrator. Independent prior to admission. Presented to [facility] 11/14/2015 with large right MCA infarct. He received intravenous TPA immediately after his initial noncontrast head CT was negative for hemorrhagic stroke. Shortly after TPA transferred to interventional radiology where they performed an embolectomy for findings of occlusion identified on CTA that was done successfully. He was transferred to the ICU started on Cardene drip for hypertensive emergency. Postoperative day 1 complicated by aspiration pneumonia requiring intubation, mechanical ventilation as well as findings of cerebral edema and requiring hemicraniotomy on 11/15/2015. He was started on hypertonic saline therapy for cerebral edema. Patient with recurrent aspiration pneumonia and several extubations and subsequent reintubation with tracheostomy performed 12/03/2015 by ENT and gastrostomy tube placed for nutritional support 12/05/2015. He was weaned from a van to trach collar on 12/09/2015. Admitted to [facility] 12/11/2015. He was decannulated 12/25/2015. Nocturnal tube feeds discontinued 12/26/2015 and diet currently dysphagia #2 nectar thick liquids. He has completed his latest antibiotic therapies 12/27/2015. Maintained on subcutaneous heparin for DVT prophylaxis as well as aspirin 325 mg daily for CVA prophylaxis. Therapies have been initiated requiring max assist for bed mobility and transfers. He is wearing a safety helmet when out of bed after  recent hemicraniotomy. Patient with dense left-sided weakness and neglect. Patient was admitted for a comprehensive rehabilitation program  Review of Systems  Constitutional: Negative for chills and fever.  HENT: Negative for hearing loss.   Eyes: Positive for blurred vision. Negative for double vision.  Respiratory: Negative for cough and shortness of breath.   Cardiovascular: Negative for palpitations and leg swelling.  Gastrointestinal: Positive for constipation. Negative for nausea and vomiting.  Genitourinary: Positive for urgency. Negative for dysuria and hematuria.  Musculoskeletal: Positive for back pain and myalgias.  Skin: Negative for rash.  Neurological: Positive for weakness. Negative for seizures and loss of consciousness.  Psychiatric/Behavioral: Positive for depression.  All other systems reviewed and are negative.    Family history. Hypertension and diabetes mellitus Social History:  has no tobacco, alcohol, and drug history on file. Allergies: NKA  Home prescriptions. Not listed PCP. Dr. Blenda Mounts [facility]  Home: Home Living Living Arrangements: Spouse/significant other Available Help at Discharge: Family, Available 24 hours/day Type of Home: House Home Access: Stairs to enter [employer] of Steps: 4 steps, can build ramp as needed Home Layout: One level  Lives With: Spouse   Functional History: Prior Function Level of Independence: Independent  Functional Status:  Mobility:  patient currently max assist for bed mobility and transfers. Total assist ambulation.        ADL:  max assist for eating and drinking due to left-sided weakness and inattention  Cognition:  limited recall of events. Limited awareness of deficits    Physical Exam: Height '5\' 9"'$  (1.753 m), weight 91.5 kg (201 lb 11.2 oz). Physical Exam  Constitutional: No distress.  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  Pupils round and reactive to light  Neck: Normal range  of motion. Neck supple. No tracheal deviation present. No thyromegaly present.  Trach stoma already closing/ pink granulation  Cardiovascular: Normal rate and regular rhythm.  Exam reveals no friction rub.   No murmur heard. Respiratory: Effort normal and breath sounds normal. No respiratory distress.  GI: Soft. Bowel sounds are normal. He exhibits no distension. There is no tenderness. There is no rebound.  PEG tube in place.  Musculoskeletal:  No pain with PROM LUE or LLE. Pt with some difficulty turning head to left due to prominent right gaze preference and right SCM tightness  Neurological: He is alert.   Follows simple commands with right upper extremity. Limited awareness and insight but oriented to name, place. States he lives in [place]. Left central 7 and homonymous hemianopsia. Left inattention. Left UE and LE are 0/5 throughout. RUE 3-4/5 prox to distal. RLE: 2+HF, 3/5 KE and 4/5 ADF/PF. Does sense pain in both LE's right greater than left. DTRs 3+ LUE and LLE, Left toes up. Mild resting extensor tone Left leg, 1/4.   Skin: He is not diaphoretic.  Psychiatric:  Very pleasant and cooperative.    128/70 pulse 80 respirations 18 temperature 98.9 and oxygen saturation is 92% room air Results for orders placed or performed during the hospital encounter of 12/11/15 (from the past 48 hour(s))  CBC     Status: Abnormal   Collection Time: 12/26/15  6:16 AM  Result Value Ref Range   WBC 8.0 4.0 - 10.5 K/uL   RBC 4.04 (L) 4.22 - 5.81 MIL/uL   Hemoglobin 10.8 (L) 13.0 - 17.0 g/dL   HCT 34.8 (L) 39.0 - 52.0 %   MCV 86.1 78.0 - 100.0 fL   MCH 26.7 26.0 - 34.0 pg   MCHC 31.0 30.0 - 36.0 g/dL   RDW 15.2 11.5 - 15.5 %   Platelets 304 150 - 400 K/uL  Basic metabolic panel     Status: Abnormal   Collection Time: 12/26/15  6:16 AM  Result Value Ref Range   Sodium 129 (L) 135 - 145 mmol/L   Potassium 4.8 3.5 - 5.1  mmol/L   Chloride 95 (L) 101 - 111 mmol/L   CO2 25 22 - 32 mmol/L   Glucose, Bld 185 (H) 65 - 99 mg/dL   BUN 20 6 - 20 mg/dL   Creatinine, Ser 0.80 0.61 - 1.24 mg/dL   Calcium 8.8 (L) 8.9 - 10.3 mg/dL   GFR calc non Af Amer >60 >60 mL/min   GFR calc Af Amer >60 >60 mL/min    Comment: (NOTE) The eGFR has been calculated using the CKD EPI equation. This calculation has not been validated in all clinical situations. eGFR's persistently <60 mL/min signify possible Chronic Kidney Disease.    Anion gap 9 5 - 15  Magnesium     Status: None   Collection Time: 12/26/15  6:16 AM  Result Value Ref Range   Magnesium 2.0 1.7 - 2.4 mg/dL  Phosphorus     Status: Abnormal   Collection Time: 12/26/15  6:16 AM  Result Value Ref Range   Phosphorus 4.9 (H) 2.5 - 4.6 mg/dL   No results found.     Medical Problem List and Plan: 1.  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 Helmet when out of bed.  -admit to inpatient rehab. 2.  DVT Prophylaxis/Anticoagulation: Subcutaneous heparin for DVT prophylaxis 3. Pain Management/chronic back pain: Oxycodone 5 mg every 6 hours as needed 4. Mood:  Prozac 40 mg daily.  Abilify 5 mg daily 5. Neuropsych: This patient is capable of making decisions on his own behalf. 6. Skin/Wound Care: Routine skin checks 7. Fluids/Electrolytes/Nutrition: Routine I&O with follow-up chemistries upon admit. 8. Tracheostomy 12/03/2015. Decannulated 12/25/2015 9. Gastrostomy PEG tube 12/05/2015. Nocturnal tube feeds recently discontinued. Currently on dysphagia #2 nectar liquid. Monitor hydration  -regular PEG maintenance 10. Hypertension. Hydralazine 10 mg every 8 hours, amlodipine 5 mg daily, lisinopril 30 mg daily. Monitor with increased mobility in therapy 11. Diabetes mellitus. Lantus insulin 13 units daily at bedtime. Check blood sugars before meals and at bedtime  -adjust regimen as indicated. 12. Hyperlipidemia. Lipitor 80 mg  daily at bedtime 13.Tobacco abuse. Counseling as appropriate   Post Admission Physician Evaluation: 1. Functional deficits secondary  to large right MCA infarct. 2. Patient is admitted to receive collaborative, interdisciplinary care between the physiatrist, rehab nursing staff, and therapy team. 3. Patient's level of medical complexity and substantial therapy needs in context of that medical necessity cannot be provided at a lesser intensity of care such as a SNF. 4. Patient has experienced substantial functional loss from his/her baseline which was documented above under the "Functional History" and "Functional Status" headings.  Judging by the patient's diagnosis, physical exam, and functional history, the patient has potential for functional progress which will result in measurable gains while on inpatient rehab.  These gains will be of substantial and practical use upon discharge  in facilitating mobility and self-care at the household level. 5. Physiatrist will provide 24 hour management of medical needs as well as oversight of the therapy plan/treatment and provide guidance as appropriate regarding the interaction of the two. 6. 24 hour rehab nursing will assist with bladder management, bowel management, safety, skin/wound care, disease management, medication administration, pain management and patient education  and help integrate therapy concepts, techniques,education, etc. 7. PT will assess and treat for/with: Lower extremity strength, range of motion, stamina, balance, functional mobility, safety, adaptive techniques and equipment, NMR, visual-spatial awareness.   Goals are: min to mod assist. 8. OT will assess and treat for/with: ADL's, functional mobility, safety, upper extremity strength, adaptive techniques and equipment, NMR, visual-spatial awareness.   Goals are: min to mod assist. Therapy may proceed with showering this patient. 9. SLP will assess and treat for/with: cognition,  swallowing, communication.  Goals are: supervision to min assist. 10. Case Management and Social Worker will assess and treat for psychological issues and discharge planning. 11. Team conference will be held weekly to assess progress toward goals and to determine barriers to discharge. 12. Patient will receive at least 3 hours of therapy per day at least 5 days per week. 13. ELOS: 22-25 days       14. Prognosis:  good     Meredith Staggers, MD, [facility] 12/27/2015

## 2015-12-27 NOTE — H&P (Signed)
Physical Medicine and Rehabilitation Admission H&P    Chief complaint: Left-sided weakness   HPI: 55 year old right handed Caucasian male with history of depression, type 2 diabetes mellitus, hypertension, tobacco abuse and chronic low back pain as he was taking oxycodone immediate release prior to admission at home. He is married and lives with his wife in [place]. His son is planning to move in with them. Patient is  Employed as a Administrator. Independent prior to admission. Presented to [facility] 11/14/2015 with large right MCA infarct. He received intravenous TPA immediately after his initial noncontrast head CT was negative for hemorrhagic stroke. Shortly after TPA transferred to interventional radiology where they performed an embolectomy for findings of occlusion identified on CTA that was done successfully. He was transferred to the ICU started on Cardene drip for hypertensive emergency. Postoperative day 1 complicated by aspiration pneumonia requiring intubation, mechanical ventilation as well as findings of cerebral edema and requiring hemicraniotomy on 11/15/2015. He was started on hypertonic saline therapy for cerebral edema. Patient with recurrent aspiration pneumonia and several extubations and subsequent reintubation with tracheostomy performed 12/03/2015 by ENT and gastrostomy tube placed for nutritional support 12/05/2015. He was weaned from a van to trach collar on 12/09/2015. Admitted to [facility] 12/11/2015. He was decannulated 12/25/2015. Nocturnal tube feeds discontinued 12/26/2015 and diet currently dysphagia #2 nectar thick liquids. He has completed his latest antibiotic therapies 12/27/2015. Maintained on subcutaneous heparin for DVT prophylaxis as well as aspirin 325 mg daily for CVA prophylaxis. Therapies have been initiated requiring max assist for bed mobility and transfers. He is wearing a safety helmet when out of bed after  recent hemicraniotomy. Patient with dense left-sided weakness and neglect. Patient was admitted for a comprehensive rehabilitation program  Review of Systems  Constitutional: Negative for chills and fever.  HENT: Negative for hearing loss.   Eyes: Positive for blurred vision. Negative for double vision.  Respiratory: Negative for cough and shortness of breath.   Cardiovascular: Negative for palpitations and leg swelling.  Gastrointestinal: Positive for constipation. Negative for nausea and vomiting.  Genitourinary: Positive for urgency. Negative for dysuria and hematuria.  Musculoskeletal: Positive for back pain and myalgias.  Skin: Negative for rash.  Neurological: Positive for weakness. Negative for seizures and loss of consciousness.  Psychiatric/Behavioral: Positive for depression.  All other systems reviewed and are negative.    Family history. Hypertension and diabetes mellitus Social History:  has no tobacco, alcohol, and drug history on file. Allergies: NKA  Home prescriptions. Not listed PCP. Dr. Blenda Mounts [facility]  Home: Home Living Living Arrangements: Spouse/significant other Available Help at Discharge: Family, Available 24 hours/day Type of Home: House Home Access: Stairs to enter [employer] of Steps: 4 steps, can build ramp as needed Home Layout: One level  Lives With: Spouse   Functional History: Prior Function Level of Independence: Independent  Functional Status:  Mobility:  patient currently max assist for bed mobility and transfers. Total assist ambulation.        ADL:  max assist for eating and drinking due to left-sided weakness and inattention  Cognition:  limited recall of events. Limited awareness of deficits    Physical Exam: Height _0  (1.753 m), weight 91.5 kg (201 lb 11.2 oz). Physical Exam  Constitutional: No distress.  HENT:  Large Right cranial defect after hemi craniotomy  Eyes: Left eye  exhibits no discharge.  Pupils round and reactive to light  Neck: Normal range of motion.  Neck supple. No tracheal deviation present. No thyromegaly present.  Trach stoma already closing/ pink granulation  Cardiovascular: Normal rate and regular rhythm.  Exam reveals no friction rub.   No murmur heard. Respiratory: Effort normal and breath sounds normal. No respiratory distress.  GI: Soft. Bowel sounds are normal. He exhibits no distension. There is no tenderness. There is no rebound.  PEG tube in place.  Musculoskeletal:  No pain with PROM LUE or LLE. Pt with some difficulty turning head to left due to prominent right gaze preference and right SCM tightness  Neurological: He is alert.   Follows simple commands with right upper extremity. Limited awareness and insight but oriented to name, place. States he lives in [place]. Left central 7 and homonymous hemianopsia. Left inattention. Left UE and LE are 0/5 throughout. RUE 3-4/5 prox to distal. RLE: 2+HF, 3/5 KE and 4/5 ADF/PF. Does sense pain in both LE's right greater than left. DTRs 3+ LUE and LLE, Left toes up. Mild resting extensor tone Left leg, 1/4.   Skin: He is not diaphoretic.  Psychiatric:  Very pleasant and cooperative.    128/70 pulse 80 respirations 18 temperature 98.9 and oxygen saturation is 92% room air Lab Results Last 48 Hours        Results for orders placed or performed during the hospital encounter of 12/11/15 (from the past 48 hour(s))  CBC     Status: Abnormal   Collection Time: 12/26/15  6:16 AM  Result Value Ref Range   WBC 8.0 4.0 - 10.5 K/uL   RBC 4.04 (L) 4.22 - 5.81 MIL/uL   Hemoglobin 10.8 (L) 13.0 - 17.0 g/dL   HCT 34.8 (L) 39.0 - 52.0 %   MCV 86.1 78.0 - 100.0 fL   MCH 26.7 26.0 - 34.0 pg   MCHC 31.0 30.0 - 36.0 g/dL   RDW 15.2 11.5 - 15.5 %   Platelets 304 150 - 400 K/uL  Basic metabolic panel     Status: Abnormal   Collection Time: 12/26/15  6:16 AM  Result Value Ref Range   Sodium  129 (L) 135 - 145 mmol/L   Potassium 4.8 3.5 - 5.1 mmol/L   Chloride 95 (L) 101 - 111 mmol/L   CO2 25 22 - 32 mmol/L   Glucose, Bld 185 (H) 65 - 99 mg/dL   BUN 20 6 - 20 mg/dL   Creatinine, Ser 0.80 0.61 - 1.24 mg/dL   Calcium 8.8 (L) 8.9 - 10.3 mg/dL   GFR calc non Af Amer >60 >60 mL/min   GFR calc Af Amer >60 >60 mL/min    Comment: (NOTE) The eGFR has been calculated using the CKD EPI equation. This calculation has not been validated in all clinical situations. eGFR's persistently <60 mL/min signify possible Chronic Kidney Disease.   Anion gap 9 5 - 15  Magnesium     Status: None   Collection Time: 12/26/15  6:16 AM  Result Value Ref Range   Magnesium 2.0 1.7 - 2.4 mg/dL  Phosphorus     Status: Abnormal   Collection Time: 12/26/15  6:16 AM  Result Value Ref Range   Phosphorus 4.9 (H) 2.5 - 4.6 mg/dL     Imaging Results (Last 48 hours)  No results found.       Medical Problem List and Plan: 1.  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 Helmet when out of bed.             -  admit to inpatient rehab. 2.  DVT Prophylaxis/Anticoagulation: Subcutaneous heparin for DVT prophylaxis 3. Pain Management/chronic back pain: Oxycodone 5 mg every 6 hours as needed 4. Mood: Prozac 40 mg daily.  Abilify 5 mg daily 5. Neuropsych: This patient is capable of making decisions on his own behalf. 6. Skin/Wound Care: Routine skin checks 7. Fluids/Electrolytes/Nutrition: Routine I&O with follow-up chemistries upon admit. 8. Tracheostomy 12/03/2015. Decannulated 12/25/2015 9. Gastrostomy PEG tube 12/05/2015. Nocturnal tube feeds recently discontinued. Currently on dysphagia #2 nectar liquid. Monitor hydration             -regular PEG maintenance 10. Hypertension. Hydralazine 10 mg every 8 hours, amlodipine 5 mg daily, lisinopril 30 mg daily. Monitor with increased mobility in therapy 11. Diabetes mellitus. Lantus insulin 13  units daily at bedtime. Check blood sugars before meals and at bedtime             -adjust regimen as indicated. 12. Hyperlipidemia. Lipitor 80 mg daily at bedtime 13.Tobacco abuse. Counseling as appropriate   Post Admission Physician Evaluation: 1. Functional deficits secondary  to large right MCA infarct. 2. Patient is admitted to receive collaborative, interdisciplinary care between the physiatrist, rehab nursing staff, and therapy team. 3. Patient's level of medical complexity and substantial therapy needs in context of that medical necessity cannot be provided at a lesser intensity of care such as a SNF. 4. Patient has experienced substantial functional loss from his/her baseline which was documented above under the "Functional History" and "Functional Status" headings.  Judging by the patient's diagnosis, physical exam, and functional history, the patient has potential for functional progress which will result in measurable gains while on inpatient rehab.  These gains will be of substantial and practical use upon discharge  in facilitating mobility and self-care at the household level. 5. PAPE remains consistent with the preadmission screening and rehab consult and the patient is appropriate for inpatient rehab.  44. Physiatrist will provide 24 hour management of medical needs as well as oversight of the therapy plan/treatment and provide guidance as appropriate regarding the interaction of the two. 7. 24 hour rehab nursing will assist with bladder management, bowel management, safety, skin/wound care, disease management, medication administration, pain management and patient education  and help integrate therapy concepts, techniques,education, etc. 8. PT will assess and treat for/with: Lower extremity strength, range of motion, stamina, balance, functional mobility, safety, adaptive techniques and equipment, NMR, visual-spatial awareness.   Goals are: min to mod assist. 9. OT will assess and  treat for/with: ADL's, functional mobility, safety, upper extremity strength, adaptive techniques and equipment, NMR, visual-spatial awareness.   Goals are: min to mod assist. Therapy may proceed with showering this patient. 10. SLP will assess and treat for/with: cognition, swallowing, communication.  Goals are: supervision to min assist. 11. Case Management and Social Worker will assess and treat for psychological issues and discharge planning. 12. Team conference will be held weekly to assess progress toward goals and to determine barriers to discharge. 13. Patient will receive at least 3 hours of therapy per day at least 5 days per week. 14. ELOS: 22-25 days       15. Prognosis:  good     Meredith Staggers, MD, [facility] 12/27/2015

## 2015-12-28 ENCOUNTER — Inpatient Hospital Stay (HOSPITAL_COMMUNITY): Payer: BLUE CROSS/BLUE SHIELD | Admitting: Speech Pathology

## 2015-12-28 ENCOUNTER — Inpatient Hospital Stay (HOSPITAL_COMMUNITY): Payer: BLUE CROSS/BLUE SHIELD | Admitting: Occupational Therapy

## 2015-12-28 ENCOUNTER — Inpatient Hospital Stay (HOSPITAL_COMMUNITY): Payer: BLUE CROSS/BLUE SHIELD | Admitting: Physical Therapy

## 2015-12-28 LAB — GLUCOSE, CAPILLARY
GLUCOSE-CAPILLARY: 202 mg/dL — AB (ref 65–99)
GLUCOSE-CAPILLARY: 158 mg/dL — AB (ref 65–99)
GLUCOSE-CAPILLARY: 166 mg/dL — AB (ref 65–99)
Glucose-Capillary: 206 mg/dL — ABNORMAL HIGH (ref 65–99)

## 2015-12-28 MED ORDER — INSULIN GLARGINE 100 UNIT/ML ~~LOC~~ SOLN
16.0000 [IU] | Freq: Every day | SUBCUTANEOUS | Status: DC
  Administered 2015-12-28: 16 [IU] via SUBCUTANEOUS
  Filled 2015-12-28 (×2): qty 0.16

## 2015-12-28 NOTE — Progress Notes (Signed)
Initial Nutrition Assessment  DOCUMENTATION CODES:   Not applicable  INTERVENTION:   -Snacks TID between meals ([employer] pudding)  NUTRITION DIAGNOSIS:   Increased nutrient needs related to acute illness as evidenced by estimated needs.  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  Pt admitted to rehab with right MCA infarct with craniotomy.   Spoke with pt at bedside, who reports he is very happy to be in the rehab center. He shares that he is currently tolerating dysphagia 2 diet with nectar thick liquids very well and generally consumes about 75% of his meals. He reports that he even consumed some potato soup brought in by family members.   Pt reports "I didn't eat for a month" prior to CIR admission and suspects he lost weight, however, unable to provide quantity or time frame for weight loss. Pt reports he is "starving to death" and is requesting chocolate pudding between meals to optimize nutrient intake. RD will order.   Nutrition-Focused physical exam completed. Findings are no fat depletion, no muscle depletion, and no edema.   Discussed importance of good meal and snack intake during rehab stay, especially to maximize rehab benefit.   Labs reviewed: CBGS: [id number].   Diet Order:  DIET DYS 2 Room service appropriate? Yes with Assist; Fluid consistency: Nectar Thick  Skin:  Reviewed, no issues  Last BM:  12/26/15  Height:   Ht Readings from Last 1 Encounters:  12/27/15 5\' 10"  (1.778 m)    Weight:   Wt Readings from Last 1 Encounters:  12/28/15 197 lb 1.6 oz (89.4 kg)    Ideal Body Weight:  75.5 kg  BMI:  Body mass index is 28.28 kg/m.  Estimated Nutritional  Needs:   Kcal:  2100-2300  Protein:  110-125 grams  Fluid:  2.1-2.3 L  EDUCATION NEEDS:   Education needs addressed  Mosi Hannold A. Mayford KnifeWilliams, RD, LDN, CDE Pager: [phone] After hours Pager: [phone]

## 2015-12-28 NOTE — Progress Notes (Addendum)
55 year old right handed Caucasian male with history of depression, type 2 diabetes mellitus, hypertension, tobacco abuse and chronic low back pain as he was taking oxycodone immediate release prior to admission at home. He is married and lives [place]. His son is planning to move in with them. Patient is  Employed as a Naval architecttruck driver. Independent prior to admission. [facility]2015 with large right MCA infarct. He received intravenous TPA immediately after his initial noncontrast head CT was negative for hemorrhagic stroke. Shortly after TPA transferred to interventional radiology where they performed an embolectomy for findings of occlusion identified on CTA that was done successfully. He was transferred to the ICU started on Cardene drip for hypertensive emergency. Postoperative day 1 complicated by aspiration pneumonia requiring intubation, mechanical ventilation as well as findings of cerebral edema and requiring hemicraniotomy on 11/15/2015. He was started on hypertonic saline therapy for cerebral edema. Patient with recurrent aspiration pneumonia and several extubations and subsequent reintubation with tracheostomy performed 12/03/2015 by ENT and gastrostomy tube placed for nutritional support 12/05/2015. He was weaned from a van to trach collar on 12/09/2015.   Subjective/Complaints: No issues overnite, pt without c/o, severe Left neglect No pains, no breathing issues  ROS  - CP/SOB, N/v/d  Objective: Vital Signs: Blood pressure (!) 122/56, pulse (!) 42, temperature 99.1 F (37.3 C), temperature source Oral, resp. rate 16, height 5\' 10"  (1.778 m), weight 89.4 kg (197 lb 1.6 oz), SpO2 99 %. No results found. Results for orders placed or performed during the hospital encounter of 12/27/15 (from the past 72 hour(s))  Glucose, capillary     Status: Abnormal   Collection Time: 12/27/15  4:18 PM  Result Value Ref Range   Glucose-Capillary 195 (H) 65 - 99 mg/dL  MRSA PCR Screening     Status: None   Collection Time: 12/27/15  4:22 PM  Result Value Ref Range   MRSA by PCR NEGATIVE NEGATIVE    Comment:        The GeneXpert MRSA Assay (FDA approved for NASAL specimens only), is one component of a comprehensive MRSA colonization surveillance program. It is not intended to diagnose MRSA infection nor to guide or monitor treatment for MRSA infections.   Glucose, capillary     Status: Abnormal   Collection Time: 12/27/15  9:22 PM  Result Value Ref Range   Glucose-Capillary 178 (H) 65 - 99 mg/dL  Glucose, capillary     Status: Abnormal   Collection Time: 12/28/15  7:01 AM  Result Value Ref Range   Glucose-Capillary 202 (H) 65 - 99 mg/dL      General: No acute distress ENT- former trach site CDI Mood and affect are appropriate Heart: iRegular rate and rhythm no rubs murmurs or extra sounds Lungs: Clear to auscultation, breathing unlabored, no rales or wheezes Abdomen: Positive bowel sounds, soft nontender to palpation, nondistended, PEG site CDI Extremities: No clubbing, cyanosis, or edema Skin: No evidence of breakdown, no evidence of rash Neurologic: Cranial nerves II through XII intact, motor strength is 5/5 in RIght  deltoid, bicep, tricep, grip, hip flexor, knee extensors, ankle dorsiflexor and plantar flexor, 0/5 on left side Sensory exam normal sensation to light touch and proprioception in right upper and lower extremities Feels pinch Left toes but not left fingers Musculoskeletal: Full range of motion in all 4 extremities. No joint swelling   Assessment/Plan: 1. Functional deficits secondary to RIght MCA infarct with left hemiplegia which require  3+ hours per day of interdisciplinary therapy in a comprehensive inpatient rehab setting. Physiatrist is providing close team supervision and 24 hour management of active medical problems listed below. Physiatrist and rehab team continue to assess  barriers to discharge/monitor patient progress toward functional and medical goals. FIM:                   Function - Comprehension Comprehension: Auditory Comprehension assist level: Understands basic 75 - 89% of the time/ requires cueing 10 - 24% of the time  Function - Expression Expression: Verbal Expression assist level: Expresses basic 50 - 74% of the time/requires cueing 25 - 49% of the time. Needs to repeat parts of sentences.  Function - Social Interaction Social Interaction assist level: Interacts appropriately 50 - 74% of the time - May be physically or verbally inappropriate.  Function - Problem Solving Problem solving assist level: Solves basic 25 - 49% of the time - needs direction more than half the time to initiate, plan or complete simple activities  Function - Memory Memory assist level: Recognizes or recalls 25 - 49% of the time/requires cueing 50 - 75% of the time Patient normally able to recall (first 3 days only): Current season, That he or she is in a hospital  Medical Problem List and Plan: 1.  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 Helmet when out of bed.             -CIR, PT, OT SLP 2.  DVT Prophylaxis/Anticoagulation: Subcutaneous heparin for DVT prophylaxis 3. Pain Management/chronic back pain: Oxycodone 5 mg every 6 hours as needed 4. Mood: Prozac 40 mg daily.  Abilify 5 mg daily 5. Neuropsych: This patient is capable of making decisions on his own behalf. 6. Skin/Wound Care: Routine skin checks 7. Fluids/Electrolytes/Nutrition: Routine I&O with follow-up chemistries upon admit. 8. Tracheostomy 12/03/2015. Decannulated 12/25/2015- no resp distress 9. Gastrostomy PEG tube 12/05/2015. Nocturnal tube feeds recently discontinued. Currently on dysphagia #2 nectar liquid. Good intake this am per RN             -regular PEG maintenance 10. Hypertension. Hydralazine 10 mg every 8 hours, amlodipine 5 mg  daily, lisinopril 30 mg daily. Monitor with increased mobility in therapy 11. Diabetes mellitus. Lantus insulin 13 units daily at bedtime. Check blood sugars before meals and at bedtime, increase lantus with goal less than 180             - CBG (last 3)   Recent Labs  12/27/15 1618 12/27/15 2122 12/28/15 0701  GLUCAP 195* 178* 202*     12. Hyperlipidemia. Lipitor 80 mg daily at bedtime 13.Tobacco abuse. Counseling as appropriate 14.  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

## 2015-12-28 NOTE — Evaluation (Signed)
Occupational Therapy Assessment and Plan  Patient Details  Name: James Holden MRN: [id number] Date of Birth: [birth year]  OT Diagnosis: abnormal posture, ataxia, cognitive deficits, disturbance of vision, flaccid hemiplegia and hemiparesis and muscle weakness (generalized) Rehab Potential: Rehab Potential (ACUTE ONLY): Good ELOS: 28-32 days   Today's Date: 12/28/2015 OT Individual Time: 1100-1200 OT Individual Time Calculation (min): 60 min      Problem List: Patient Active Problem List   Diagnosis Date Noted  . Right middle cerebral artery stroke ([place]) 12/27/2015    Past Medical History: No past medical history on file. Past Surgical History: No past surgical history on file.  Assessment & Plan Clinical Impression: Patient is a 55 y.o. year old male with recent admission to the hospital Presented to [facility]/14/2015 with large right MCA infarct. He received intravenous TPA immediately after his initial noncontrast head CT was negative for hemorrhagic stroke. Shortly after TPA transferred to interventional radiology where they performed an embolectomy for findings of occlusion identified on CTA that was done successfully. He was transferred to the ICU started on Cardene drip for hypertensive emergency. Postoperative day 1 complicated by aspiration pneumonia requiring intubation, mechanical ventilation as well as findings of cerebral edema and requiring hemicraniotomy on 11/15/2015. He was started on hypertonic saline therapy for cerebral edema. Patient with recurrent aspiration pneumonia and several extubations and subsequent reintubation with tracheostomy performed 12/03/2015 by ENT and gastrostomy tube placed for nutritional support 12/05/2015. He was weaned from a van to trach collar on 12/09/2015. Admitted to [facility] 12/11/2015. He was decannulated 12/25/2015. Nocturnal tube feeds discontinued 12/26/2015 and diet currently dysphagia #2 nectar thick  liquids. He has completed his latest antibiotic therapies 12/27/2015. Maintained on subcutaneous heparin for DVT prophylaxis as well as aspirin 325 mg daily for CVA prophylaxis. Therapies have been initiated requiring max assist for bed mobility and transfers. He is wearing a safety helmet when out of bed after recent hemicraniotomy. Patient with dense left-sided weakness and neglect. Patient transferred to CIR on 12/27/2015 .    Patient currently requires total with basic self-care skills secondary to muscle weakness, decreased activity tolerance, abnormal tone, unbalanced muscle activation, decreased coordination and decreased motor planning, decreased visual perceptual skills, decreased visual motor skills and field cut, decreased midline orientation, decreased attention to left and left side neglect, decreased initiation, decreased attention, decreased awareness, decreased problem solving and decreased safety awareness, and decreased sitting balance, decreased standing balance, decreased postural control, hemiplegia and decreased balance strategies.  Prior to hospitalization, patient could complete ADL and IADL with independent .  Patient will benefit from skilled intervention to decrease level of assist with basic self-care skills and increase independence with basic self-care skills prior to discharge home with care partner.  Anticipate patient will require minimal physical assistance and follow up home health.  OT - End of Session Endurance Deficit: Yes OT Assessment Rehab Potential (ACUTE ONLY): Good OT Patient demonstrates impairments in the following area(s): Balance;Cognition;Endurance;Motor;Perception;Safety;Sensory;Vision OT Basic ADL's Functional Problem(s): Eating;Grooming;Bathing;Dressing;Toileting OT Transfers Functional Problem(s): Toilet;Tub/Shower OT Additional Impairment(s): Fuctional Use of Upper Extremity OT Plan OT Intensity: Minimum of 1-2 x/day, 45 to 90 minutes OT  Frequency: 5 out of 7 days OT Duration/Estimated Length of Stay: 28-32 days OT Treatment/Interventions: Balance/vestibular training;Cognitive remediation/compensation;Community reintegration;Discharge planning;DME/adaptive equipment instruction;Functional electrical stimulation;Functional mobility training;Neuromuscular re-education;Pain management;Patient/family education;Psychosocial support;Self Care/advanced ADL retraining;Therapeutic Activities;Skin care/wound managment;Therapeutic Exercise;Splinting/orthotics;UE/LE Strength taining/ROM;UE/LE Coordination activities;Visual/perceptual remediation/compensation;Wheelchair propulsion/positioning OT Self Feeding Anticipated Outcome(s): Supervision OT Basic Self-Care Anticipated Outcome(s): Min A OT  Toileting Anticipated Outcome(s): Min A OT Bathroom Transfers Anticipated Outcome(s): Min A OT Recommendation Patient destination: Home Follow Up Recommendations: Home health OT;24 hour supervision/assistance Equipment Recommended: To be determined   Skilled Therapeutic Intervention Initial eval completed with treatment provided to address functional transfers, positioning, L side attention/awareness, and adapted bathing/dressing skills. Pt transferred to sitting EOB with Total A x1 to L side- Max instructional cues required to initiate task. Pt required Max A to maintain sitting balance w/o UE support w/ severe lateral lean to L. Pt able to grasp footboard w/ R UE and maintain sitting balance w/ Min/Mod A. Lateral scoot >w/c with Total Ax1 + max instructional cues and manual facilitation for weight shifting. Pt brought to sink for bathing and required Min/Mod A to maintain sitting balance. Max instructional cues and hand-over hand assist needed to locate items on L side of sink. Pt able to attend to wash L arm with Max cues. Pt unable to achieve midline head orientation at the sink using mirror feedback. Pt required total A to don pants and sara plus used  for sit<>stand with Max A x2 Total A to don brief and pull pants over hips. Pt with severe L neglect.OT provided pt with L 1/2 lap tray and educated pt and friend on shoulder positioning for L shoulder subluxation. Pt left seated in w.c at end of session with safety belt in place and in care of RN .   OT Evaluation Precautions/Restrictions  Precautions Precautions: Fall Precaution Comments: L hemiplegia, R hemicraniotomy  Required Braces or Orthoses: Other Brace/Splint Other Brace/Splint: Helmet when OOB Pain Pain Assessment Faces Pain Scale: No hurt Home Living/Prior Functioning Home Living Available Help at Discharge: Family, Available 24 hours/day Type of Home: House Home Access: Stairs to enter [employer] of Steps: 4 steps, can build ramp as needed Home Layout: One level Bathroom Shower/Tub: Multimedia programmer: Programmer, systems: Yes  Lives With: Spouse IADL History Current License: Yes Occupation: Full time employment 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 Prior Function Level of Independence: Independent with basic ADLs, Independent with homemaking with ambulation  Able to Take Stairs?: Yes Driving: Yes Vocation: Full time employment Vocation Requirements: Truck driver ADL ADL ADL Comments: Please see functional navigator Vision/Perception  Perception Perception: Impaired Inattention/Neglect: Does not attend to left visual field;Does not attend to left side of body Praxis Praxis: Impaired Praxis Impairment Details: Motor planning  Cognition Overall Cognitive Status: Impaired/Different from baseline Arousal/Alertness: Awake/alert Orientation Level: Person;Place;Situation Person: Oriented Place: Oriented Situation: Oriented Year: 2017 Month: November Day of Week: Incorrect Memory: Impaired Immediate Memory Recall: Sock;Blue;Bed Memory Recall: Sock;Blue Memory Recall Sock: Without  Cue Memory Recall Blue: Without Cue Attention: Focused Awareness: Impaired Problem Solving: Impaired Executive Function: Reasoning;Sequencing;Organizing;Initiating;Decision Making Behaviors: Impulsive Safety/Judgment: Impaired Sensation Sensation Light Touch: Impaired Detail Light Touch Impaired Details: Absent LLE;Impaired LLE Proprioception: Impaired by gross assessment Coordination Gross Motor Movements are Fluid and Coordinated: No Fine Motor Movements are Fluid and Coordinated: No Motor  Motor Motor: Hemiplegia Motor - Skilled Clinical Observations: Dense L hemplegia Mobility  Bed Mobility Bed Mobility: Supine to Sit  Trunk/Postural Assessment  Postural Control Postural Control: Deficits on evaluation Head Control: poor head control, unable to bring head to midline Trunk Control: lateral lean to L  Balance Balance Balance Assessed: Yes Static Sitting Balance Static Sitting - Balance Support: Right upper extremity supported Static Sitting - Level of Assistance: 3: Mod assist;2: Max assist (Max progressing to Mod) Dynamic  Sitting Balance Dynamic Sitting - Balance Support: Left upper extremity supported;During functional activity Dynamic Sitting - Level of Assistance: 2: Max assist Extremity/Trunk Assessment RUE Assessment RUE Assessment: Within Functional Limits LUE Assessment LUE Assessment: Exceptions to WFL LUE Tone LUE Tone: Flaccid LUE Tone Comments: shoulder subluxation   See Function Navigator for Current Functional Status.   Refer to Care Plan for Long Term Goals  Recommendations for other services: None  Discharge Criteria: Patient will be discharged from OT if patient refuses treatment 3 consecutive times without medical reason, if treatment goals not met, if there is a change in medical status, if patient makes no progress towards goals or if patient is discharged from hospital.  The above assessment, treatment plan, treatment alternatives and  goals were discussed and mutually agreed upon: by patient  Valma Cava 12/28/2015, 2:44 PM

## 2015-12-28 NOTE — Evaluation (Signed)
Speech Language Pathology Assessment and Plan  Patient Details  Name: James Holden MRN: [id number] Date of Birth: 09/06/1960  SLP Diagnosis: Cognitive Impairments;Dysphagia;Dysarthria  Rehab Potential: Excellent ELOS: 4 weeks     Today's Date: 12/28/2015 SLP Individual Time: 0900-1000 SLP Individual Time Calculation (min): 60 min    Problem List:  Patient Active Problem List   Diagnosis Date Noted  . Right middle cerebral artery stroke ([place]) 12/27/2015   Past Medical History: No past medical history on file. Past Surgical History: No past surgical history on file.  Assessment / Plan / Recommendation Clinical Impression Patient is a 55 year old right handed Caucasian male with history of depression, type 2 diabetes mellitus, hypertension, tobacco abuse and chronic low back pain as he was taking oxycodone immediate release prior to admission at home. He is married and lives with his wife in [place]. His son is planning to move in with them. Patient is Employed as a Administrator. Independent prior to admission. Presented to [facility] 11/14/2015 with large right MCA infarct. He received intravenous TPA immediately after his initial noncontrast head CT was negative for hemorrhagic stroke. Shortly after TPA transferred to interventional radiology where they performed an embolectomy for findings of occlusion identified on CTA that was done successfully. He was transferred to the ICU started on Cardene drip for hypertensive emergency. Postoperative day 1 complicated by aspiration pneumonia requiring intubation, mechanical ventilation as well as findings of cerebral edema and requiring hemicraniotomy on 11/15/2015. He was started on hypertonic saline therapy for cerebral edema. Patient with recurrent aspiration pneumonia and several extubations and subsequent reintubation with tracheostomy performed 12/03/2015 by ENT and gastrostomy tube placed for nutritional  support 12/05/2015. He was weaned from a van to trach collar on 12/09/2015. [facility]. He was decannulated 12/25/2015. Nocturnal tube feeds discontinued 12/26/2015 and diet currently on Dys. 2 textures with nectar thick liquids. He has completed his latest antibiotic therapies 12/27/2015. Maintained on subcutaneous heparin for DVT prophylaxis as well as aspirin 325 mg daily for CVA prophylaxis. Therapies have been initiated requiring max assist for bed mobility and transfers. He is wearing a safety helmet when out of bed after recent hemicraniotomy. Patient with dense left-sided weakness and neglect. Patient was admitted for a comprehensive rehabilitation program 12/27/15.  Patient demonstrates severe cognitive impairments characterized by left neglect, decreased sustained attention, decreased intellectual awareness of deficits, decreased functional problem solving, and decreased short-term memory which impacts his ability to complete functional and familiar tasks safely. Patient demonstrates mild dysarthria characterized by imprecise consonants due to oral-motor weakness and decreased ROM and an increased speech rate. Patient consumed trials of thin liquids via cup without overt s/s of aspiration with intermittent left anterior spillage and use of multiple swallows. Suspect patient is ready for repeat MBS to assess readiness for possible upgrade. Patient also demonstrated mild oral residue with solid textures that he independently cleared with a liquid wash. Patient's safety with meals was also impacted by cognitive impairments in regards to decreased attention to task and impulsivity. Recommend patient continue current diet of Dys. 2 textures with nectar-thick liquids with full supervision to maximize safety and repeat MBS prior to upgrade. Patient would benefit from skilled SLP intervention to maximize his cognitive and swallowing function in order to maximize his overall  functional independence prior to discharge.     Skilled Therapeutic Interventions          Administered a cognitive-linguistic evaluation and BSE. Please see above  for details.   SLP Assessment  Patient will need skilled Speech Lanaguage Pathology Services during CIR admission    Recommendations  SLP Diet Recommendations: Dysphagia 2 (Fine chop);Nectar Liquid Administration via: Cup;No straw Medication Administration: Crushed with puree Supervision: Patient able to self feed;Full supervision/cueing for compensatory strategies;Staff to assist with self feeding Compensations: Minimize environmental distractions;Slow rate;Small sips/bites;Follow solids with liquid Postural Changes and/or Swallow Maneuvers: Seated upright 90 degrees Oral Care Recommendations: Oral care BID Recommendations for Other Services: Neuropsych consult Patient destination: Home Follow up Recommendations: Home Health SLP;24 hour supervision/assistance Equipment Recommended: To be determined    SLP Frequency 3 to 5 out of 7 days   SLP Duration  SLP Intensity  SLP Treatment/Interventions 4 weeks   Minumum of 1-2 x/day, 30 to 90 minutes  Cognitive remediation/compensation;Cueing hierarchy;Functional tasks;Patient/family education;Therapeutic Activities;Internal/external aids;Dysphagia/aspiration precaution training;Environmental controls    Pain No/Denies Pain  Prior Functioning Type of Home: House  Lives With: Spouse Available Help at Discharge: Family;Available 24 hours/day Vocation: Full time employment  Function:  Eating Eating   Modified Consistency Diet: Yes Eating Assist Level: Set up assist for;Supervision or verbal cues;Help with picking up utensils;Help managing cup/glass   Eating Set Up Assist For: Opening containers       Cognition Comprehension Comprehension assist level: Understands basic 75 - 89% of the time/ requires cueing 10 - 24% of the time  Expression   Expression assist  level: Expresses basic 75 - 89% of the time/requires cueing 10 - 24% of the time. Needs helper to occlude trach/needs to repeat words.  Social Interaction Social Interaction assist level: Interacts appropriately 50 - 74% of the time - May be physically or verbally inappropriate.  Problem Solving Problem solving assist level: Solves basic 25 - 49% of the time - needs direction more than half the time to initiate, plan or complete simple activities  Memory Memory assist level: Recognizes or recalls 25 - 49% of the time/requires cueing 50 - 75% of the time   Short Term Goals: Week 1: SLP Short Term Goal 1 (Week 1): Patient will maintain eye contact at midline to conversation partner during functional conversation/task for ~30 seconds in 25% of opportunities  SLP Short Term Goal 2 (Week 1): Patient will perform visual scanning tasks to locate items at midline with Max A multimodal cues in 25% of opportunities.  SLP Short Term Goal 3 (Week 1): Patient will identify 2 cognitive deficits with Max A multimodal cues.  SLP Short Term Goal 4 (Week 1): Patient will demonstrate sustained attention to a functional task for ~5 minutes with Mod A verbal cues for redirection.  SLP Short Term Goal 5 (Week 1): Patient will consume trials of thin liquids without overt s/s of aspiration with Min A verbal cues for use of small sips over 2 sessions to assess readiness for repeat MBS.  SLP Short Term Goal 6 (Week 1): Patient will consume current diet without overt s/s of aspiration with Mod A verbal cues for use of swallowing compensatory strategies.   Refer to Care Plan for Long Term Goals  Recommendations for other services: Neuropsych  Discharge Criteria: Patient will be discharged from SLP if patient refuses treatment 3 consecutive times without medical reason, if treatment goals not met, if there is a change in medical status, if patient makes no progress towards goals or if patient is discharged from hospital.  The  above assessment, treatment plan, treatment alternatives and goals were discussed and mutually agreed upon: by patient  Rondo Spittler,  Jamieson Lisa 12/28/2015, 3:42 PM

## 2015-12-28 NOTE — Evaluation (Signed)
Physical Therapy Assessment and Plan  Patient Details  Name: James Holden MRN: [id number] Date of Birth: March 28, 1960  PT Diagnosis: Abnormal posture, Difficulty walking, Hemiplegia non-dominant, Hypotonia, Impaired cognition, Impaired sensation, Low back pain, Muscle spasms and Muscle weakness Rehab Potential: Good ELOS: 4 weeks.    Today's Date: 12/28/2015 PT Individual Time: 1300-1410 PT Individual Time Calculation (min): 70 min     Problem List:  Patient Active Problem List   Diagnosis Date Noted  . 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 Past Surgical History: No past surgical history on file.  Assessment & Plan Clinical Impression: Patient is a 55 year old right handed Caucasian male with history of depression, type 2 diabetes mellitus, hypertension, tobacco abuse and chronic low back pain as he was taking oxycodone immediate release prior to admission at home. He is married and lives with his wife in [place]. His son is planning to move in with them. Patient is Employed as a Administrator. Independent prior to admission. Presented to [facility] 11/14/2015 with large right MCA infarct. He received intravenous TPA immediately after his initial noncontrast head CT was negative for hemorrhagic stroke. Shortly after TPA transferred to interventional radiology where they performed an embolectomy for findings of occlusion identified on CTA that was done successfully. He was transferred to the ICU started on Cardene drip for hypertensive emergency. Postoperative day 1 complicated by aspiration pneumonia requiring intubation, mechanical ventilation as well as findings of cerebral edema and requiring hemicraniotomy on 11/15/2015. He was started on hypertonic saline therapy for cerebral edema. Patient with recurrent aspiration pneumonia and several extubations and subsequent reintubation with  tracheostomy performed 12/03/2015 by ENT and gastrostomy tube placed for nutritional support 12/05/2015. He was weaned from a van to trach collar on 12/09/2015. Admitted to [facility] 12/11/2015. He was decannulated 12/25/2015. Nocturnal tube feeds discontinued 12/26/2015 and diet currently dysphagia #2 nectar thick liquids. He has completed his latest antibiotic therapies 12/27/2015. Maintained on subcutaneous heparin for DVT prophylaxis as well as aspirin 325 mg daily for CVA prophylaxis. Therapies have been initiated requiring max assist for bed mobility and transfers. He is wearing a safety helmet when out of bed after recent hemicraniotomy. Patient with dense left-sided weakness and neglect. Patient transferred to CIR on 12/27/2015 .   Patient currently requires total with mobility secondary to muscle weakness, abnormal tone, unbalanced muscle activation and motor apraxia, decreased visual perceptual skills and field cut, decreased midline orientation and left side neglect, decreased initiation, decreased attention, decreased awareness, decreased problem solving, decreased safety awareness, decreased memory and delayed processing and decreased sitting balance, decreased standing balance, decreased postural control, hemiplegia and decreased balance strategies.  Prior to hospitalization, patient was independent  with mobility and lived with Spouse in a House home.  Home access is 4 steps, can build ramp as neededStairs to enter.  Patient will benefit from skilled PT intervention to maximize safe functional mobility, minimize fall risk and decrease caregiver burden for planned discharge home with 24 hour assist.  Anticipate patient will benefit from follow up [facility] at discharge.  PT - End of Session Activity Tolerance: Tolerates 10 - 20 min activity with multiple rests Endurance Deficit: Yes PT Assessment Rehab Potential (ACUTE/IP ONLY): Good Barriers to Discharge: Inaccessible home  environment;Decreased caregiver support PT Patient demonstrates impairments in the following area(s): Balance;Behavior;Endurance;Motor;Pain;Perception;Safety;Sensory;Skin Integrity PT Transfers Functional Problem(s): Bed Mobility;Bed to Chair;Car;Furniture PT Locomotion Functional Problem(s): Ambulation;Stairs;Wheelchair  Mobility PT Plan PT Intensity: Minimum of 1-2 x/day ,45 to 90 minutes PT Frequency: 5 out of 7 days PT Duration Estimated Length of Stay: 4 weeks.  PT Treatment/Interventions: Ambulation/gait training;Balance/vestibular training;Community reintegration;Discharge planning;Cognitive remediation/compensation;Disease management/prevention;DME/adaptive equipment instruction;Functional electrical stimulation;Functional mobility training;Neuromuscular re-education;Pain management;Patient/family education;Psychosocial support;Skin care/wound management;Splinting/orthotics;Stair training;Therapeutic Activities;Therapeutic Exercise PT Transfers Anticipated Outcome(s): Min Assist with LRAD PT Locomotion Anticipated Outcome(s): Mod Assist for house hold gait with LRAD. Supervision A with WC.  PT Recommendation Follow Up Recommendations: Home health PT Patient destination: Home Equipment Recommended: Wheelchair (measurements);Wheelchair cushion (measurements);To be determined      Skilled Therapeutic Intervention Pt received supine in bed and agreeable to PT. PT instructed patient in PT evaluation and initiated treatment intervention; see below for results. PT educated patient and family on Plan of care, treatment interventions, estimated length of stay, discharge recommendations and potential equipment needs. Patient performed sit<>stand in sara with max +2 for set up x 3 and SB transfer with max +2 to left and Max assist to R. Patient returned to bed with Max Assist +2 transfer at end of session and left with RN.    PT Evaluation Precautions/Restrictions   L hemiplegia, fall, Helmet  with OOB.  General   Vital Signs Pain Pain Assessment Pain Assessment: 0-10 Pain Score: 4  Faces Pain Scale: Hurts whole lot Pain Type: Chronic pain Pain Location: Back Pain Orientation: Lower Pain Descriptors / Indicators: Sore Pain Intervention(s): Medication (See eMAR) Home Living/Prior Functioning Home Living Available Help at Discharge: Family;Available 24 hours/day Type of Home: House Home Access: Stairs to enter [employer] of Steps: 4 steps, can build ramp as needed Home Layout: One level Bathroom Shower/Tub: Multimedia programmer: Standard Bathroom Accessibility: Yes  Lives With: Spouse Prior Function Level of Independence: Independent with basic ADLs;Independent with homemaking with ambulation  Able to Take Stairs?: Yes Driving: Yes Vocation: Full time employment Vocation Requirements: Truck driver Vision/Perception  Vision - Assessment Eye Alignment: Impaired (comment) Ocular Range of Motion: Restricted on the left Alignment/Gaze Preference: Chin down;Head turned Tracking/Visual Pursuits: Left eye does not track laterally;Right eye does not track medially Praxis Praxis-Other Comments: able to initate movement in LLE in supine unweighted, but unable to perform active movement in LLE with weight bearing.    Cognition Overall Cognitive Status: Impaired/Different from baseline Arousal/Alertness: Awake/alert Orientation Level: Oriented to person;Disoriented to time;Oriented to situation;Disoriented to place Attention: Sustained;Focused Focused Attention: Appears intact Sustained Attention: Impaired Sustained Attention Impairment: Verbal basic;Functional basic Memory: Impaired Memory Impairment: Decreased recall of new information;Decreased short term memory;Retrieval deficit Decreased Short Term Memory: Verbal basic;Functional basic Awareness: Impaired Awareness Impairment: Intellectual impairment Problem Solving: Impaired Problem Solving  Impairment: Verbal basic;Functional basic Reasoning: Impaired Reasoning Impairment: Verbal basic;Functional basic Sequencing: Impaired Sequencing Impairment: Verbal basic;Functional basic Organizing: Impaired Organizing Impairment: Verbal basic;Functional basic Decision Making: Impaired Decision Making Impairment: Functional basic Initiating: Appears intact Behaviors: Impulsive Safety/Judgment: Impaired Sensation Sensation Light Touch: Impaired Detail Light Touch Impaired Details: Absent LLE;Impaired LLE Stereognosis: Not tested Hot/Cold: Not tested Proprioception: Impaired by gross assessment Additional Comments: deep touch in tack in LLE.  Coordination Gross Motor Movements are Fluid and Coordinated: No Fine Motor Movements are Fluid and Coordinated: No Motor  Motor Motor: Hemiplegia;Abnormal tone;Motor impersistence Motor - Skilled Clinical Observations: Dense L hemplegia, inconsistent activation of the LLE   Mobility Bed Mobility Bed Mobility: Supine to Sit;Rolling Right;Rolling Left;Sit to Supine Rolling Right: 2: Max assist Rolling Right Details: Verbal cues for technique;Verbal cues for precautions/safety;Visual cues/gestures for precautions/safety;Visual cues/gestures for  sequencing;Verbal cues for sequencing;Verbal cues for safe use of DME/AE Rolling Left: 2: Max assist Rolling Left Details: Verbal cues for technique;Verbal cues for precautions/safety;Verbal cues for safe use of DME/AE;Visual cues/gestures for precautions/safety;Visual cues/gestures for sequencing;Verbal cues for gait pattern;Manual facilitation for weight shifting;Manual facilitation for placement Supine to Sit: 2: Max assist Supine to Sit Details: Verbal cues for technique;Verbal cues for precautions/safety;Verbal cues for safe use of DME/AE;Verbal cues for gait pattern;Manual facilitation for weight shifting;Manual facilitation for placement;Manual facilitation for weight bearing;Visual cues/gestures  for precautions/safety;Visual cues/gestures for sequencing;Verbal cues for sequencing;Visual cues for safe use of DME/AE Sit to Supine: 2: Max assist Sit to Supine - Details: Visual cues/gestures for precautions/safety;Visual cues/gestures for sequencing;Verbal cues for technique;Verbal cues for precautions/safety;Verbal cues for gait pattern;Verbal cues for safe use of DME/AE;Manual facilitation for weight shifting;Manual facilitation for placement;Manual facilitation for weight bearing Transfers Transfers: Yes Lateral/Scoot Transfers: 2: Max assist Lateral/Scoot Transfer Details: Verbal cues for technique;Verbal cues for safe use of DME/AE;Verbal cues for gait pattern;Manual facilitation for placement;Manual facilitation for weight bearing;Manual facilitation for weight shifting;Verbal cues for precautions/safety;Tactile cues for placement;Tactile cues for weight beaing Transfer via Lift Equipment: Marketing executive (+2 for set up ) Locomotion  Ambulation Ambulation: No Gait Gait: No Stairs / Additional Locomotion Stairs: No Architect: Yes Wheelchair Assistance: 3: Mod assist;2: Max Technical sales engineer Details: Manual facilitation for weight bearing;Verbal cues for sequencing;Verbal cues for technique;Visual cues for safe use of DME/AE;Visual cues/gestures for precautions/safety;Visual cues/gestures for sequencing;Tactile cues for placement;Tactile cues for weight beaing;Tactile cues for weight shifting;Verbal cues for precautions/safety;Verbal cues for safe use of DME/AE;Manual facilitation for placement Wheelchair Propulsion: Right upper extremity;Right lower extremity Wheelchair Parts Management: Needs assistance Distance: 166f   Trunk/Postural Assessment  Cervical Assessment Cervical Assessment: Exceptions to [facility](Chin down and rotated right. ) Thoracic Assessment Thoracic Assessment: Exceptions to [facility](flexed posture) Lumbar Assessment Lumbar  Assessment: Exceptions to [facility](posterior pelvic tilt) Postural Control Postural Control: Deficits on evaluation Head Control: poor head control, unable to bring head to midline Trunk Control: lateral lean to L  Balance Balance Balance Assessed: Yes Static Sitting Balance Static Sitting - Balance Support: Right upper extremity supported Static Sitting - Level of Assistance: 3: Mod assist (Max progressing to Mod) Dynamic Sitting Balance Dynamic Sitting - Balance Support: Left upper extremity supported;During functional activity Dynamic Sitting - Level of Assistance: 2: Max assist (with functional transfers) Extremity Assessment      RLE Assessment RLE Assessment: Within Functional Limits LLE Assessment LLE Assessment: Exceptions to [facility](2+/3 to 3/5 in supine at hip, knee. 2/5 at ankle. unable to initate in sitting. PROM WFL.  )   See Function Navigator for Current Functional Status.   Refer to Care Plan for Long Term Goals  Recommendations for other services: None  Discharge Criteria: Patient will be discharged from PT if patient refuses treatment 3 consecutive times without medical reason, if treatment goals not met, if there is a change in medical status, if patient makes no progress towards goals or if patient is discharged from hospital.  The above assessment, treatment plan, treatment alternatives and goals were discussed and mutually agreed upon: by patient and by family  ALorie Phenix11/12/2015, 6:25 PM

## 2015-12-28 NOTE — Progress Notes (Signed)
Patient's HR is in the low 40's. Denies any chest pain or discomfort. MD made aware. No orders received at this time. Will monitor.

## 2015-12-29 ENCOUNTER — Inpatient Hospital Stay (HOSPITAL_COMMUNITY): Payer: BLUE CROSS/BLUE SHIELD

## 2015-12-29 ENCOUNTER — Inpatient Hospital Stay (HOSPITAL_COMMUNITY): Payer: BLUE CROSS/BLUE SHIELD | Admitting: Physical Therapy

## 2015-12-29 DIAGNOSIS — M7989 Other specified soft tissue disorders: Secondary | ICD-10-CM

## 2015-12-29 DIAGNOSIS — I491 Atrial premature depolarization: Secondary | ICD-10-CM

## 2015-12-29 DIAGNOSIS — M79609 Pain in unspecified limb: Secondary | ICD-10-CM

## 2015-12-29 LAB — GLUCOSE, CAPILLARY
GLUCOSE-CAPILLARY: 191 mg/dL — AB (ref 65–99)
GLUCOSE-CAPILLARY: 199 mg/dL — AB (ref 65–99)
Glucose-Capillary: 233 mg/dL — ABNORMAL HIGH (ref 65–99)
Glucose-Capillary: 165 mg/dL — ABNORMAL HIGH (ref 65–99)

## 2015-12-29 MED ORDER — INSULIN GLARGINE 100 UNIT/ML ~~LOC~~ SOLN
19.0000 [IU] | Freq: Every day | SUBCUTANEOUS | Status: DC
Start: 2015-12-29 — End: 2015-12-30
  Administered 2015-12-29: 19 [IU] via SUBCUTANEOUS
  Filled 2015-12-29: qty 0.19

## 2015-12-29 NOTE — Progress Notes (Signed)
Subjective/Complaints: No dizziness, no CP   ROS  - CP/SOB, N/v/d  Objective: Vital Signs: Blood pressure (!) 138/56, pulse (!) 40, temperature 98.3 F (36.8 C), temperature source Oral, resp. rate 18, height 5\' 10"  (1.778 m), weight 89.1 kg (196 lb 6.4 oz), SpO2 98 %. No results found. Results for orders placed or performed during the hospital encounter of 12/27/15 (from the past 72 hour(s))  Glucose, capillary     Status: Abnormal   Collection Time: 12/27/15  4:18 PM  Result Value Ref Range   Glucose-Capillary 195 (H) 65 - 99 mg/dL  MRSA PCR Screening     Status: None   Collection Time: 12/27/15  4:22 PM  Result Value Ref Range   MRSA by PCR NEGATIVE NEGATIVE    Comment:        The GeneXpert MRSA Assay (FDA approved for NASAL specimens only), is one component of a comprehensive MRSA colonization surveillance program. It is not intended to diagnose MRSA infection nor to guide or monitor treatment for MRSA infections.   Glucose, capillary     Status: Abnormal   Collection Time: 12/27/15  9:22 PM  Result Value Ref Range   Glucose-Capillary 178 (H) 65 - 99 mg/dL  Glucose, capillary     Status: Abnormal   Collection Time: 12/28/15  7:01 AM  Result Value Ref Range   Glucose-Capillary 202 (H) 65 - 99 mg/dL  Glucose, capillary     Status: Abnormal   Collection Time: 12/28/15 11:41 AM  Result Value Ref Range   Glucose-Capillary 206 (H) 65 - 99 mg/dL  Glucose, capillary     Status: Abnormal   Collection Time: 12/28/15  4:19 PM  Result Value Ref Range   Glucose-Capillary 158 (H) 65 - 99 mg/dL  Glucose, capillary     Status: Abnormal   Collection Time: 12/28/15  9:44 PM  Result Value Ref Range   Glucose-Capillary 166 (H) 65 - 99 mg/dL  Glucose, capillary     Status: Abnormal   Collection Time: 12/29/15  6:45 AM  Result Value Ref Range   Glucose-Capillary 191 (H) 65 - 99 mg/dL   Comment 1 Notify RN       General: No acute distress ENT- former trach site CDI Mood  and affect are appropriate Heart: Regular rate no rubs murmurs or extra sounds Lungs: Clear to auscultation, breathing unlabored, no rales or wheezes Abdomen: Positive bowel sounds, soft nontender to palpation, nondistended, PEG site CDI Extremities: No clubbing, cyanosis, or edema Skin: No evidence of breakdown, no evidence of rash Neurologic: Cranial nerves II through XII intact, motor strength is 5/5 in RIght  deltoid, bicep, tricep, grip, hip flexor, knee extensors, ankle dorsiflexor and plantar flexor, 0/5 on left side Sensory exam normal sensation to light touch and proprioception in right upper and lower extremities Feels pinch Left toes but not left fingers Musculoskeletal: Full range of motion in all 4 extremities. No joint swelling   Assessment/Plan: 1. Functional deficits secondary to RIght MCA infarct with left hemiplegia which require 3+ hours per day of interdisciplinary therapy in a comprehensive inpatient rehab setting. Physiatrist is providing close team supervision and 24 hour management of active medical problems listed below. Physiatrist and rehab team continue to assess barriers to discharge/monitor patient progress toward functional and medical goals. FIM: Function - Bathing Position: Wheelchair/chair at sink Body parts bathed by patient: Chest, Abdomen, Left arm Body parts bathed by helper: Right arm, Front perineal area, Buttocks, Right upper leg, Left upper leg,  Right lower leg, Left lower leg, Back Bathing not applicable: Right lower leg Assist Level: 2 helpers  Function- Upper Body Dressing/Undressing What is the patient wearing?: Pull over shirt/dress Pull over shirt/dress - Perfomed by helper: Thread/unthread right sleeve, Put head through opening, Thread/unthread left sleeve, Pull shirt over trunk Assist Level: 2 helpers Function - Lower Body Dressing/Undressing What is the patient wearing?: Pants, Socks Position: Wheelchair/chair at sink Pants- Performed  by helper: Thread/unthread right pants leg, Thread/unthread left pants leg, Pull pants up/down Socks - Performed by helper: Don/doff left sock, Don/doff right sock Assist for footwear: Dependant Assist for lower body dressing: 2 Helpers  Function - Toileting Toileting steps completed by helper: Adjust clothing prior to toileting, Performs perineal hygiene, Adjust clothing after toileting Assist level: Two helpers  Function - Archivist transfer assistive device: Mechanical lift Assist level to toilet: 2 helpers Assist level from toilet: 2 helpers  Function - Chair/bed transfer Chair/bed transfer method: Lateral scoot Chair/bed transfer assist level: 2 helpers Chair/bed transfer assistive device: Sliding board  Function - Locomotion: Wheelchair Will patient use wheelchair at discharge?: Yes Type: Manual Max wheelchair distance: 120ft  Assist Level: Maximal assistance (Pt 25 - 49%) Assist Level: Maximal assistance (Pt 25 - 49%) Wheel 150 feet activity did not occur: Safety/medical concerns Turns around,maneuvers to table,bed, and toilet,negotiates 3% grade,maneuvers on rugs and over doorsills: No Function - Locomotion: Ambulation Ambulation activity did not occur: Safety/medical concerns Walk 10 feet activity did not occur: Safety/medical concerns Walk 50 feet with 2 turns activity did not occur: Safety/medical concerns Walk 150 feet activity did not occur: Safety/medical concerns Walk 10 feet on uneven surfaces activity did not occur: Safety/medical concerns  Function - Comprehension Comprehension: Auditory Comprehension assist level: Understands basic 75 - 89% of the time/ requires cueing 10 - 24% of the time  Function - Expression Expression: Verbal Expression assist level: Expresses basic 75 - 89% of the time/requires cueing 10 - 24% of the time. Needs helper to occlude trach/needs to repeat words.  Function - Social Interaction Social Interaction assist  level: Interacts appropriately 50 - 74% of the time - May be physically or verbally inappropriate.  Function - Problem Solving Problem solving assist level: Solves basic 25 - 49% of the time - needs direction more than half the time to initiate, plan or complete simple activities  Function - Memory Memory assist level: Recognizes or recalls 25 - 49% of the time/requires cueing 50 - 75% of the time Patient normally able to recall (first 3 days only): Current season, That he or she is in a hospital  Medical Problem List and Plan: 1.  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 Helmet when out of bed.             -CIR, PT, OT SLP 2.  DVT Prophylaxis/Anticoagulation: Subcutaneous heparin for DVT prophylaxis 3. Pain Management/chronic back pain: Oxycodone 5 mg every 6 hours as needed 4. Mood: Prozac 40 mg daily.  Abilify 5 mg daily 5. Neuropsych: This patient is capable of making decisions on his own behalf. 6. Skin/Wound Care: Routine skin checks 7. Fluids/Electrolytes/Nutrition: Routine I&O with follow-up chemistries upon admit. 8. Tracheostomy 12/03/2015. Decannulated 12/25/2015- no resp distress 9. Gastrostomy PEG tube 12/05/2015. Nocturnal tube feeds recently discontinued. Currently on dysphagia #2 nectar liquid. Good intake this am per RN             -regular PEG maintenance 10. Hypertension. Hydralazine 10 mg every 8  hours, amlodipine 5 mg daily, lisinopril 30 mg daily. Monitor with increased mobility in therapy 11. Diabetes mellitus. Lantus insulin 16 units daily at bedtime.will increase to 19U Check blood sugars before meals and at bedtime, increase lantus with goal less than 180             - CBG (last 3)   Recent Labs  12/28/15 1619 12/28/15 2144 12/29/15 0645  GLUCAP 158* 166* 191*     12. Hyperlipidemia. Lipitor 80 mg daily at bedtime 13.Tobacco abuse. Counseling as appropriate 14.  EKG evidence of type 2 AV block, bradycardia  mainly noted with dynamap peripheral pulse, EKG showed nl vent rate, will repeat EKG, discussed cardiology freq PAC, not type 2 AVB  LOS (Days) 2 A FACE TO FACE EVALUATION WAS PERFORMED  KIRSTEINS,ANDREW E 12/29/2015, 9:50 AM

## 2015-12-29 NOTE — Progress Notes (Signed)
VASCULAR LAB PRELIMINARY  PRELIMINARY  PRELIMINARY  PRELIMINARY  Bilateral lower extremity venous duplex completed.    Preliminary report:  There is no obvious evidence of DVT or SVT noted in the bilateral lower extremities.   Taimane Stimmel, RVT 12/29/2015, 10:09 AM

## 2015-12-29 NOTE — Progress Notes (Addendum)
Physical Therapy Session Note  Patient Details  Name: James Holden George Beem MRN: [id number] Date of Birth: 06/11/1960  Today's Date: 12/29/2015 PT Individual Time: 1302-1402 PT Individual Time Calculation (min): 60 min    Short Term Goals: Week 1:  PT Short Term Goal 1 (Week 1): Patient will perfom all bed mobility consistently with Max Assist.  PT Short Term Goal 2 (Week 1): Patient will transfer with +1 consistently with LRAD  PT Short Term Goal 3 (Week 1): patient will initate standing balance PT Short Term Goal 4 (Week 1): Patient will be able to perform sitting balance without UE support with min assist  PT Short Term Goal 5 (Week 1): Patient will perform WC mobility with consistent mod assist x 11500ft    Skilled Therapeutic Interventions/Progress Updates:    Pt received in bed & agreeable to tx, noting intermittent BLE spasms but no pain. Pt required max assist to thread & don pants & shirt as well as max cuing to attend to task due to pt conversing about something to eat. Pt requested to eat a frosty & RN reported pt is not allowed to have that; educated pt on need to have all liquids thickened & all food prepared for his specific diet. Pt required max assist for anterior weight shift for donning clothing and +2 assist for supine>sitting EOB as pt with reduced ability to pull himself to sitting even with use of hospital bed features. Pt transferred bed>w/c via [place] [employer] lift with +2 assist; pt required total assist for w/c parts management (leg rests & left half lap tray). Pt noted dizziness with positional changes; HR = 42-45 bpm, SpO2 = 100%, BP = 123/56 mmHg while sitting in w/c & RN notified. Transported pt to [employer] gym and utilized dynavision in controlled, quiet environment with lights off with task focusing on tracking, attending to L, and reorienting pt to midline. Pt easily frustrated with task and unable to identify red lights in middle of light board even with max multimodal cuing. Transported  pt back to room & discussed need for wife to sit in front of him when conversing with pt to help pt look towards midline. Encouraged pt to sit in w/c and benefits of sitting in w/c versus lying in bed all day; pt agitated with therapist request & therapist notified RN of pt's wishes to get back in bed. Pt left in w/c with all needs within reach, QRB donned, half lap tray attached, & wife present.  Notified RN of pt's need for pancake call bell.   Pt's helmet donned when OOB.  Therapy Documentation Precautions:  Precautions Precautions: Fall Precaution Comments: L hemiplegia, R hemicraniotomy  Required Braces or Orthoses: Other Brace/Splint Other Brace/Splint: Helmet when OOB   See Function Navigator for Current Functional Status.   Therapy/Group: Individual Therapy  Sandi MariscalVictoria M Koben Daman 12/29/2015, 2:13 PM

## 2015-12-29 NOTE — Progress Notes (Signed)
EKG was done as per MD order.Results were showed to physician.Keep monitoring pt.

## 2015-12-30 ENCOUNTER — Inpatient Hospital Stay (HOSPITAL_COMMUNITY): Payer: BLUE CROSS/BLUE SHIELD | Admitting: Occupational Therapy

## 2015-12-30 ENCOUNTER — Inpatient Hospital Stay (HOSPITAL_COMMUNITY): Payer: BLUE CROSS/BLUE SHIELD

## 2015-12-30 ENCOUNTER — Inpatient Hospital Stay (HOSPITAL_COMMUNITY): Payer: BLUE CROSS/BLUE SHIELD | Admitting: Speech Pathology

## 2015-12-30 DIAGNOSIS — G8112 Spastic hemiplegia affecting left dominant side: Secondary | ICD-10-CM

## 2015-12-30 LAB — COMPREHENSIVE METABOLIC PANEL
ALK PHOS: 92 U/L (ref 38–126)
ALT: 40 U/L (ref 17–63)
ANION GAP: 11 (ref 5–15)
AST: 38 U/L (ref 15–41)
Albumin: 3.2 g/dL — ABNORMAL LOW (ref 3.5–5.0)
BUN: 20 mg/dL (ref 6–20)
CALCIUM: 9.4 mg/dL (ref 8.9–10.3)
CO2: 25 mmol/L (ref 22–32)
CREATININE: 1.01 mg/dL (ref 0.61–1.24)
Chloride: 95 mmol/L — ABNORMAL LOW (ref 101–111)
Glucose, Bld: 244 mg/dL — ABNORMAL HIGH (ref 65–99)
Potassium: 4.8 mmol/L (ref 3.5–5.1)
SODIUM: 131 mmol/L — AB (ref 135–145)
TOTAL PROTEIN: 7.8 g/dL (ref 6.5–8.1)
Total Bilirubin: 0.6 mg/dL (ref 0.3–1.2)

## 2015-12-30 LAB — CBC WITH DIFFERENTIAL/PLATELET
Basophils Absolute: 0 10*3/uL (ref 0.0–0.1)
Basophils Relative: 0 %
EOS ABS: 0.2 10*3/uL (ref 0.0–0.7)
EOS PCT: 3 %
HCT: 36 % — ABNORMAL LOW (ref 39.0–52.0)
HEMOGLOBIN: 11.4 g/dL — AB (ref 13.0–17.0)
LYMPHS ABS: 2.2 10*3/uL (ref 0.7–4.0)
LYMPHS PCT: 32 %
MCH: 27.5 pg (ref 26.0–34.0)
MCHC: 31.7 g/dL (ref 30.0–36.0)
MCV: 86.7 fL (ref 78.0–100.0)
MONOS PCT: 6 %
Monocytes Absolute: 0.4 10*3/uL (ref 0.1–1.0)
Neutro Abs: 4 10*3/uL (ref 1.7–7.7)
Neutrophils Relative %: 59 %
Platelets: 308 10*3/uL (ref 150–400)
RBC: 4.15 MIL/uL — AB (ref 4.22–5.81)
RDW: 15.6 % — ABNORMAL HIGH (ref 11.5–15.5)
WBC: 6.8 10*3/uL (ref 4.0–10.5)

## 2015-12-30 LAB — GLUCOSE, CAPILLARY
GLUCOSE-CAPILLARY: 156 mg/dL — AB (ref 65–99)
GLUCOSE-CAPILLARY: 183 mg/dL — AB (ref 65–99)
GLUCOSE-CAPILLARY: 125 mg/dL — AB (ref 65–99)
Glucose-Capillary: 144 mg/dL — ABNORMAL HIGH (ref 65–99)

## 2015-12-30 MED ORDER — INSULIN GLARGINE 100 UNIT/ML ~~LOC~~ SOLN
22.0000 [IU] | Freq: Every day | SUBCUTANEOUS | Status: DC
  Administered 2015-12-30 – 2016-01-19 (×21): 22 [IU] via SUBCUTANEOUS
  Filled 2015-12-30 (×22): qty 0.22

## 2015-12-30 NOTE — IPOC Note (Addendum)
Overall Plan of Care [facility](IPOC) Patient Details Name: Janey GentaJarvis George Durfee MRN: [id number] DOB: 05/04/1960  Admitting Diagnosis: CVA With Craniotomy  Hospital Problems: Active Problems:   Right middle cerebral artery stroke [facility](HCC)     Functional Problem List: Nursing Behavior, Bladder, Bowel, Endurance, Medication Management, Nutrition, Perception, Safety, Sensory, Skin Integrity, Motor  PT Balance, Behavior, Endurance, Motor, Pain, Perception, Safety, Sensory, Skin Integrity  OT Balance, Cognition, Endurance, Motor, Perception, Safety, Sensory, Vision  SLP Cognition, Nutrition  TR         Basic ADL's: OT Eating, Grooming, Bathing, Dressing, Toileting     Advanced  ADL's: OT       Transfers: PT Bed Mobility, Bed to Chair, Car, Occupational psychologisturniture  OT Toilet, Research scientist (life sciences)Tub/Shower     Locomotion: PT Ambulation, Stairs, Psychologist, prison and probation servicesWheelchair Mobility     Additional Impairments: OT Fuctional Use of Upper Extremity  SLP Swallowing, Social Cognition, Communication expression Social Interaction, Problem Solving, Memory, Attention, Awareness  TR      Anticipated Outcomes Item Anticipated Outcome  Self Feeding Supervision  Swallowing  Supervision with least restrictive diet   Basic self-care  Min A  Toileting  Min A   Bathroom Transfers Min A  Bowel/Bladder  minimal assist  Transfers  Min Assist with LRAD  Locomotion  Mod Assist for house hold gait with LRAD. Supervision A with WC.   Communication  Mod I   Cognition  Min A   Pain  3 or less  Safety/Judgment  Mod assist   Therapy Plan: PT Intensity: Minimum of 1-2 x/day ,45 to 90 minutes PT Frequency: 5 out of 7 days PT Duration Estimated Length of Stay: 4 weeks.  OT Intensity: Minimum of 1-2 x/day, 45 to 90 minutes OT Frequency: 5 out of 7 days OT Duration/Estimated Length of Stay: 28-32 days SLP Intensity: Minumum of 1-2 x/day, 30 to 90 minutes SLP Frequency: 3 to 5 out of 7 days SLP Duration/Estimated Length of Stay: 4 weeks         Team Interventions: Nursing Interventions Patient/Family Education, Medication Management, Bladder Management, Bowel Management, Disease Management/Prevention, Pain Management, Discharge Planning, Cognitive Remediation/Compensation, Dysphagia/Aspiration Precaution Training, Skin Care/Wound Management  PT interventions Ambulation/gait training, Warden/rangerBalance/vestibular training, Community reintegration, Discharge planning, Cognitive remediation/compensation, Disease management/prevention, DME/adaptive equipment instruction, Functional electrical stimulation, Functional mobility training, Neuromuscular re-education, Pain management, Patient/family education, Psychosocial support, Skin care/wound management, Splinting/orthotics, Stair training, Therapeutic Activities, Therapeutic Exercise  OT Interventions Balance/vestibular training, Cognitive remediation/compensation, Community reintegration, Discharge planning, DME/adaptive equipment instruction, Functional electrical stimulation, Functional mobility training, Neuromuscular re-education, Pain management, Patient/family education, Psychosocial support, Self Care/advanced ADL retraining, Therapeutic Activities, Skin care/wound managment, Therapeutic Exercise, Splinting/orthotics, UE/LE Strength taining/ROM, UE/LE Coordination activities, Visual/perceptual remediation/compensation, Wheelchair propulsion/positioning  SLP Interventions Cognitive remediation/compensation, Financial traderCueing hierarchy, Functional tasks, Patient/family education, Therapeutic Activities, Internal/external aids, Dysphagia/aspiration precaution training, Environmental controls  TR Interventions    SW/CM Interventions  Psychosocial Assessment, Discharge Planning, Pt/Family Education    Team Discharge Planning: Destination: PT-Home ,OT- Home , SLP-Home Projected Follow-up: PT-Home health PT, OT-  Home health OT, 24 hour supervision/assistance, SLP-Home Health SLP, 24 hour  supervision/assistance Projected Equipment Needs: PT-Wheelchair (measurements), Wheelchair cushion (measurements), To be determined, OT- To be determined, SLP-To be determined Equipment Details: PT- , OT-  Patient/family involved in discharge planning: PT- Patient, Family member/caregiver,  OT-Patient, SLP-Patient  MD ELOS: 22-25d Medical Rehab Prognosis:  Good Assessment: 55 year old right handed Caucasian male with history of depression, type 2 diabetes mellitus, hypertension, tobacco abuse and chronic low back pain as he was taking oxycodone  immediate release prior to admission at home. He is married and [place] His son is planning to move in with them. Patient is Employed as a Naval architecttruck driver. Independent prior to admission. [facility] with large right MCA infarct. He received intravenous TPA immediately after his initial noncontrast head CT was negative for hemorrhagic stroke. Shortly after TPA transferred to interventional radiology where they performed an embolectomy for findings of occlusion identified on CTA that was done successfully. He was transferred to the ICU started on Cardene drip for hypertensive emergency. Postoperative day 1 complicated by aspiration pneumonia requiring intubation, mechanical ventilation as well as findings of cerebral edema and requiring hemicraniotomy on 11/15/2015. He was started on hypertonic saline therapy for cerebral edema. Patient with recurrent aspiration pneumonia and several extubations and subsequent reintubation with tracheostomy performed 12/03/2015 by ENT and gastrostomy tube placed for nutritional support 12/05/2015. He was weaned from a van to trach collar on 12/09/2015. [facility]/2015. He was decannulated 12/25/2015. Nocturnal tube feeds discontinued 12/26/2015 and diet currently dysphagia #2 nectar thick liquids    Now requiring 24/7 Rehab  RN,MD, as well as CIR level PT, OT and SLP.  Treatment team will focus on ADLs and mobility with [facility]  See Team Conference Notes for weekly updates to the plan of care

## 2015-12-30 NOTE — Progress Notes (Signed)
Speech Language Pathology Daily Session Note  Patient Details  Name: James Holden MRN: [id number] Date of Birth: 03/22/1960  Today's Date: 12/30/2015 SLP Individual Time: 1435-1530 SLP Individual Time Calculation (min): 55 min   Short Term Goals: Week 1: SLP Short Term Goal 1 (Week 1): Patient will maintain eye contact at midline to conversation partner during functional conversation/task for ~30 seconds in 25% of opportunities  SLP Short Term Goal 2 (Week 1): Patient will perform visual scanning tasks to locate items at midline with Max A multimodal cues in 25% of opportunities.  SLP Short Term Goal 3 (Week 1): Patient will identify 2 cognitive deficits with Max A multimodal cues.  SLP Short Term Goal 4 (Week 1): Patient will demonstrate sustained attention to a functional task for ~5 minutes with Mod A verbal cues for redirection.  SLP Short Term Goal 5 (Week 1): Patient will consume trials of thin liquids without overt s/s of aspiration with Min A verbal cues for use of small sips over 2 sessions to assess readiness for repeat MBS.  SLP Short Term Goal 6 (Week 1): Patient will consume current diet without overt s/s of aspiration with Mod A verbal cues for use of swallowing compensatory strategies.   Skilled Therapeutic Interventions:  Pt was seen for skilled ST targeting goals for cognition and dysphagia.  SLP facilitated the session with trials of thin liquids to continue working towards repeat objective study.  Pt demonstrated slight impulsivity when consuming thin liquids but did not present with any overt s/s of aspiration.  Pt needed consistent mod-max assist verbal cues to monitor and correct anterior labial loss of thins due to poor awareness of decreased left sided strength and sensation.  Therapist also facilitated the session with a basic money sorting task targeting sustained attention to task and visual scanning to the left of midline.  Pt needed max assist verbal cues to  complete task and demonstrated poor awareness of errors.  After task, pt was able to identify that his deficits post stroke include "looking to the left," focus, and processing speed.  Pt was left in bed at the end of today's therapy session with call bell within reach and bed alarm set.  Continue per current plan of care.    Function:  Eating Eating   Modified Consistency Diet: Yes Eating Assist Level: Set up assist for;Supervision or verbal cues   Eating Set Up Assist For: Opening containers       Cognition Comprehension Comprehension assist level: Understands basic 75 - 89% of the time/ requires cueing 10 - 24% of the time  Expression   Expression assist level: Expresses basic 75 - 89% of the time/requires cueing 10 - 24% of the time. Needs helper to occlude trach/needs to repeat words.  Social Interaction Social Interaction assist level: Interacts appropriately 50 - 74% of the time - May be physically or verbally inappropriate.  Problem Solving Problem solving assist level: Solves basic 25 - 49% of the time - needs direction more than half the time to initiate, plan or complete simple activities  Memory Memory assist level: Recognizes or recalls 25 - 49% of the time/requires cueing 50 - 75% of the time    Pain Pain Assessment Pain Assessment: No/denies pain  Therapy/Group: Individual Therapy  Aashika Carta, Melanee SpryNicole L 12/30/2015, 4:21 PM

## 2015-12-30 NOTE — Progress Notes (Signed)
Occupational Therapy Session Note  Patient Details  Name: James Holden MRN: [id number] Date of Birth: 06/01/1960  Today's Date: 12/30/2015 OT Individual Time: 0703-0800 OT Individual Time Calculation (min): 57 min     Short Term Goals: Week 1:  OT Short Term Goal 1 (Week 1): Pt will attend to L side of body to wash L arm with Mod instructional cues OT Short Term Goal 2 (Week 1): Pt will don shirt with max A using hemi techniques OT Short Term Goal 3 (Week 1): Pt will tolerate giv-more-sling on L UE when OOB  Skilled Therapeutic Interventions/Progress Updates:    Upon entering the room, pt supine in bed with no c/o pain. Pt declined bathing this session but agreeable to don LB clothing. Pt rolled with max A to L with max multimodal cues for hand placement and technique. Pt rolled to R with total A from therapist. Once on side, OT pulling pants over B hips. Supine >sit to EOB with total A. Total A squat pivot transfer from bed >wheelchair with second person steadying wheelchair. Pt needing maximal cues to attend to L side during session and manual facilitation for head turn at times when scanning the room for particular item. Pt drinking thickened liquid with min verbal cues for swallowing precautions. OT propelled pt in wheelchair to RN station for safety. Quick release belt donned.   Therapy Documentation Precautions:  Precautions Precautions: Fall Precaution Comments: L hemiplegia, R hemicraniotomy  Required Braces or Orthoses: Other Brace/Splint Other Brace/Splint: Helmet when OOB Restrictions Weight Bearing Restrictions: No General:   Vital Signs: Therapy Vitals Pulse Rate: 72 BP: (!) 134/56 Patient Position (if appropriate): Lying Pain: Pain Assessment Pain Assessment: No/denies pain Pain Score: 2  Faces Pain Scale: No hurt ADL: ADL ADL Comments: Please see functional navigator Exercises:   Other Treatments:    See Function Navigator for Current Functional  Status.   Therapy/Group: Individual Therapy  Alen BleacherBradsher, Daisy Lites P 12/30/2015, 12:40 PM

## 2015-12-30 NOTE — Care Management Note (Signed)
[facility] Individual Statement of Services  Patient Name:  James Holden  Date:  12/30/2015  Welcome [facility] the [facility].  Our goal is to provide you with an individualized program based on your diagnosis and situation, designed to meet your specific needs.  With this comprehensive rehabilitation program, you will be expected to participate in at least 3 hours of rehabilitation therapies Monday-Friday, with modified therapy programming on the weekends.  Your rehabilitation program will include the following services:  Physical Therapy (PT), Occupational Therapy (OT), Speech Therapy (ST), 24 hour per day rehabilitation nursing, Therapeutic Recreaction (TR), Neuropsychology, Case Management (Social Worker), Rehabilitation Medicine, Nutrition Services and Pharmacy Services  Weekly team conferences will be held on Wednesday to discuss your progress.  Your Social Worker will talk with you frequently to get your input and to update you on team discussions.  Team conferences with you and your family in attendance may also be held.  Expected length of stay: 4 weeks  Overall anticipated outcome: min/mod level of assist  Depending on your progress and recovery, your program may change. Your Social Worker will coordinate services and will keep you informed of any changes. Your Social Worker's name and contact numbers are listed  below.  The following services may also be recommended but are not provided by the [facility]:   Driving Evaluations  Home Health Rehabiltiation Services  Outpatient Rehabilitation Services  Vocational Rehabilitation   Arrangements will be made to provide these services after discharge if needed.  Arrangements include referral to agencies that provide these services.  Your insurance has been verified to be:  Anthem BCBS & Aetna Your primary doctor is:  Josiah LoboJohn Holden  Pertinent information will be shared  with your doctor and your insurance company.  Social Worker:  Dossie DerBecky Halia Franey, SW [phone] or [phone]) [phone]  Information discussed with and copy given to patient by: Lucy Chrisupree, Amardeep Beckers G, 12/30/2015, 9:46 AM

## 2015-12-30 NOTE — Progress Notes (Signed)
Physical Therapy Note  Patient Details  Name: James Holden MRN: [id number] Date of Birth: 10/08/1960 Today's Date: 12/30/2015  0900-1000, 60 min individual tx Pain: L hip joint, unrated; decreased with gentle PROM in supine  Bed mobility +2 with HOB elevated.  Rolling L x 5 blocked practice without rail, min assist.  neuromuscular re-education throughout session via multimodal cues for L attention via turning head and eyes .  Pt sat EOB x 5 minutes and spontaneously requested foam helmet before leaving room.  Slide board transfer to R +2.   W/c propulsion using visual target to improve L attention, mod assist using hemi technique.  Pt tolerated static stander x 10 minutes during reaching and matching activities using R hand.  Pt easily frustrated and limited by poor upright tolerance.  Pt returned to bed via squat pivot to R, using bed railing; pt initiated elevating hips but sat with R hip only on bed and unable to assist further.  Pt wet; NT requested for hygiene change. Bed alarm set; NT present.   See function navigator for current status  Mohamedamin Nifong 12/30/2015, 7:55 AM

## 2015-12-30 NOTE — Progress Notes (Signed)
Physical Therapy Session Note  Patient Details  Name: James Holden MRN: [id number] Date of Birth: 04/03/1960  Today's Date: 12/30/2015 PT Individual Time: 0354-6568 PT Individual Time Calculation (min): 27 min    Short Term Goals: Week 1:  PT Short Term Goal 1 (Week 1): Patient will perfom all bed mobility consistently with Max Assist.  PT Short Term Goal 2 (Week 1): Patient will transfer with +1 consistently with LRAD  PT Short Term Goal 3 (Week 1): patient will initate standing balance PT Short Term Goal 4 (Week 1): Patient will be able to perform sitting balance without UE support with min assist  PT Short Term Goal 5 (Week 1): Patient will perform WC mobility with consistent mod assist x 125f     Therapy Documentation Precautions:  Precautions Precautions: Fall Precaution Comments: L hemiplegia, R hemicraniotomy  Required Braces or Orthoses: Other Brace/Splint Other Brace/Splint: Helmet when OOB Restrictions Weight Bearing Restrictions: No Vital Signs: Therapy Vitals Pulse Rate: 72 BP: (!) 134/56 Patient Position (if appropriate): Lying Pain: Pain Assessment Pain Assessment: No/denies pain   Patient received supine in bed. Patient agreeable to therapy session however reports increased fatigue from previous sessions.   Supine to rolling to the right side mod assist  Right side lying to seated edge of bed max assist.  Patient sat edge of bed for approximately 6 minutes focusing on upright posture, postioning in midline, static and dynamic activities. Patient reports increased fatigue with rest breaks throughout. Patient reports dizziness during seated activity. Vitals did not demonstrate a drop in pressure. Will continue to assess, RN notified and aware.   Patient returned to supine  Patient performed bridging 3x10 with manual facilitation for LLE muscle activation. Left lower extremity hip and knee flexion/extension gravity eliminated to promote muscle  activiation.  Patient remained supine in bed at end of session with all needs met and bed alarm engaged.   See Function Navigator for Current Functional Status.   Therapy/Group: Individual Therapy  WRetta Diones11/13/2017, 11:26 AM

## 2015-12-30 NOTE — Progress Notes (Signed)
Subjective/Complaints: No issues overnite per pt, OT in room, severe left neglect but responds to cuing, thought I was OT despite seeing pt every am  ROS  - CP/SOB, N/v/d  Objective: Vital Signs: Blood pressure (!) 137/96, pulse (!) 54, temperature 98.5 F (36.9 C), temperature source Oral, resp. rate 17, height 5\' 10"  (1.778 m), weight 90.5 kg (199 lb 8.3 oz), SpO2 99 %. No results found. Results for orders placed or performed during the hospital encounter of 12/27/15 (from the past 72 hour(s))  Glucose, capillary     Status: Abnormal   Collection Time: 12/27/15  4:18 PM  Result Value Ref Range   Glucose-Capillary 195 (H) 65 - 99 mg/dL  MRSA PCR Screening     Status: None   Collection Time: 12/27/15  4:22 PM  Result Value Ref Range   MRSA by PCR NEGATIVE NEGATIVE    Comment:        The GeneXpert MRSA Assay (FDA approved for NASAL specimens only), is one component of a comprehensive MRSA colonization surveillance program. It is not intended to diagnose MRSA infection nor to guide or monitor treatment for MRSA infections.   Glucose, capillary     Status: Abnormal   Collection Time: 12/27/15  9:22 PM  Result Value Ref Range   Glucose-Capillary 178 (H) 65 - 99 mg/dL  Glucose, capillary     Status: Abnormal   Collection Time: 12/28/15  7:01 AM  Result Value Ref Range   Glucose-Capillary 202 (H) 65 - 99 mg/dL  Glucose, capillary     Status: Abnormal   Collection Time: 12/28/15 11:41 AM  Result Value Ref Range   Glucose-Capillary 206 (H) 65 - 99 mg/dL  Glucose, capillary     Status: Abnormal   Collection Time: 12/28/15  4:19 PM  Result Value Ref Range   Glucose-Capillary 158 (H) 65 - 99 mg/dL  Glucose, capillary     Status: Abnormal   Collection Time: 12/28/15  9:44 PM  Result Value Ref Range   Glucose-Capillary 166 (H) 65 - 99 mg/dL  Glucose, capillary     Status: Abnormal   Collection Time: 12/29/15  6:45 AM  Result Value Ref Range   Glucose-Capillary 191 (H) 65 -  99 mg/dL   Comment 1 Notify RN   Glucose, capillary     Status: Abnormal   Collection Time: 12/29/15 12:27 PM  Result Value Ref Range   Glucose-Capillary 199 (H) 65 - 99 mg/dL  Glucose, capillary     Status: Abnormal   Collection Time: 12/29/15  4:53 PM  Result Value Ref Range   Glucose-Capillary 233 (H) 65 - 99 mg/dL  Glucose, capillary     Status: Abnormal   Collection Time: 12/29/15  8:26 PM  Result Value Ref Range   Glucose-Capillary 165 (H) 65 - 99 mg/dL  Glucose, capillary     Status: Abnormal   Collection Time: 12/30/15  6:43 AM  Result Value Ref Range   Glucose-Capillary 156 (H) 65 - 99 mg/dL      General: No acute distress ENT- former trach site CDI Mood and affect are appropriate Heart: Regular rate no rubs murmurs , + ectopy Lungs: Clear to auscultation, breathing unlabored, no rales or wheezes Abdomen: Positive bowel sounds, soft nontender to palpation, nondistended, PEG site CDI Extremities: No clubbing, cyanosis, or edema Skin: No evidence of breakdown, no evidence of rash Neurologic: Cranial nerves II through XII intact, motor strength is 5/5 in RIght  deltoid, bicep, tricep, grip, hip flexor,  knee extensors, ankle dorsiflexor and plantar flexor, 0/5 on left side Sensory exam normal sensation to light touch and proprioception in right upper and lower extremities Feels pinch Left toes but not left fingers Musculoskeletal: Full range of motion in all 4 extremities. No joint swelling   Assessment/Plan: 1. Functional deficits secondary to RIght MCA infarct with left hemiplegia which require 3+ hours per day of interdisciplinary therapy in a comprehensive inpatient rehab setting. Physiatrist is providing close team supervision and 24 hour management of active medical problems listed below. Physiatrist and rehab team continue to assess barriers to discharge/monitor patient progress toward functional and medical goals. FIM: Function - Bathing Position:  Wheelchair/chair at sink Body parts bathed by patient: Chest, Abdomen, Left arm Body parts bathed by helper: Right arm, Front perineal area, Buttocks, Right upper leg, Left upper leg, Right lower leg, Left lower leg, Back Bathing not applicable: Right lower leg Assist Level: 2 helpers  Function- Upper Body Dressing/Undressing What is the patient wearing?: Pull over shirt/dress Pull over shirt/dress - Perfomed by helper: Thread/unthread right sleeve, Thread/unthread left sleeve, Put head through opening, Pull shirt over trunk Assist Level: 2 helpers Function - Lower Body Dressing/Undressing What is the patient wearing?: Pants Position: Wheelchair/chair at sink Pants- Performed by helper: Thread/unthread right pants leg, Thread/unthread left pants leg, Pull pants up/down Socks - Performed by helper: Don/doff left sock, Don/doff right sock Assist for footwear: Dependant Assist for lower body dressing: 2 Helpers  Function - Toileting Toileting steps completed by helper: Adjust clothing prior to toileting, Performs perineal hygiene, Adjust clothing after toileting Assist level: Two helpers  Function - Archivist transfer assistive device: Mechanical lift Assist level to toilet: 2 helpers Assist level from toilet: 2 helpers  Function - Chair/bed transfer Chair/bed transfer method: Lateral scoot Chair/bed transfer assist level: 2 helpers Chair/bed transfer assistive device: Systems developer lift: Chiropractor - Locomotion: Wheelchair Will patient use wheelchair at discharge?: Yes Type: Manual Max wheelchair distance: 144ft  Assist Level: Maximal assistance (Pt 25 - 49%) Assist Level: Maximal assistance (Pt 25 - 49%) Wheel 150 feet activity did not occur: Safety/medical concerns Turns around,maneuvers to table,bed, and toilet,negotiates 3% grade,maneuvers on rugs and over doorsills: No Function - Locomotion: Ambulation Ambulation activity did not occur:  Safety/medical concerns Walk 10 feet activity did not occur: Safety/medical concerns Walk 50 feet with 2 turns activity did not occur: Safety/medical concerns Walk 150 feet activity did not occur: Safety/medical concerns Walk 10 feet on uneven surfaces activity did not occur: Safety/medical concerns  Function - Comprehension Comprehension: Auditory Comprehension assist level: Understands basic 75 - 89% of the time/ requires cueing 10 - 24% of the time  Function - Expression Expression: Verbal Expression assist level: Expresses basic 75 - 89% of the time/requires cueing 10 - 24% of the time. Needs helper to occlude trach/needs to repeat words.  Function - Social Interaction Social Interaction assist level: Interacts appropriately 50 - 74% of the time - May be physically or verbally inappropriate.  Function - Problem Solving Problem solving assist level: Solves basic 25 - 49% of the time - needs direction more than half the time to initiate, plan or complete simple activities  Function - Memory Memory assist level: Recognizes or recalls 25 - 49% of the time/requires cueing 50 - 75% of the time Patient normally able to recall (first 3 days only): Current season, That he or she is in a hospital  Medical Problem List and Plan: 1.  Left hemiplegia  and dysphagia secondary to right MCA infarct with craniotomy 11/15/2015. Helmet when out of bed.             -CIR, PT, OT SLP 2.  DVT Prophylaxis/Anticoagulation: Subcutaneous heparin for DVT prophylaxis 3. Pain Management/chronic back pain: Oxycodone 5 mg every 6 hours as needed 4. Mood: Prozac 40 mg daily.  Abilify 5 mg daily 5. Neuropsych: This patient is capable of making decisions on his own behalf. 6. Skin/Wound Care: Routine skin checks 7. Fluids/Electrolytes/Nutrition: Routine I&O with follow-up chemistries upon admit. 8. Tracheostomy 12/03/2015. Decannulated 12/25/2015- no resp distress 9. Gastrostomy PEG tube 12/05/2015. Nocturnal tube  feeds recently discontinued. Currently on dysphagia #2 nectar liquid. Good intake this am per RN             -regular PEG maintenance, may d/c after 01/19/2016 10. Hypertension. Hydralazine 10 mg every 8 hours, amlodipine 5 mg daily, lisinopril 30 mg daily. Monitor with increased mobility in therapy 11. Diabetes mellitus. Lantus insulin 19 units daily at bedtime.will increase to 22U Check blood sugars before meals and at bedtime,             - CBG (last 3)   Recent Labs  12/29/15 1653 12/29/15 2026 12/30/15 0643  GLUCAP 233* 165* 156*     12. Hyperlipidemia. Lipitor 80 mg daily at bedtime 13.Tobacco abuse. Counseling as appropriate 14.  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

## 2015-12-30 NOTE — Progress Notes (Signed)
Patient information reviewed and entered into eRehab system by Deaundra Kutzer, RN, CRRN, PPS Coordinator.  Information including medical coding and functional independence measure will be reviewed and updated through discharge.    

## 2015-12-30 NOTE — Progress Notes (Signed)
Social Work Assessment and Plan Social Work Assessment and Plan  Patient Details  Name: James Holden MRN: [id number] Date of Birth: 03/10/1960  Today's Date: 12/30/2015  Problem List:  Patient Active Problem List   Diagnosis Date Noted  . 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 Past Surgical History: No past surgical history on file. Social History:  has no tobacco, alcohol, and drug history on file.  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 Son has moved back in with his parents to be able to assist with his care-but will be working. Friend to be with while wife and son working. Caregiver Availability: 24/7 Family Dynamics: Close knit family who are willing to do whatever they need to do to provide the care he will need at home. Already working on a ramp and their bathroom.  They are taking each day at a time and can see his progress already.  Social History Preferred language: English Religion: None Cultural Background: No issues Education: High School Read: Yes Write: Yes Employment Status: Employed Name of Employer: Truck Driver-local Return to Work Plans: Unsure will depend upon his progress Fish farm managerLegal Hisotry/Current Legal Issues: No issues Guardian/Conservator: None-according to MD pt is capable of making his own decisions while here, wife is here daily and will have input into any decisions made here.   Abuse/Neglect Physical Abuse: Denies Verbal Abuse: Denies Sexual Abuse: Denies Exploitation of patient/patient's resources: Denies Self-Neglect: Denies  Emotional Status Pt's affect, behavior  adn adjustment status: Pt is motivated to make as much progress as he can here. He wants to get back to being able to take care of himself and if he can get back to work. His wif eis very involved and supportive and cheers him on. Both very glad he is here on the rehab unit. Recent Psychosocial Issues: other health issues were being maanged until this happened. Pyschiatric History: History of depression takes medication for and finds it helpful. Would benefit from seing neuro-psych while here for coping and due to long hospitalization. Substance Abuse History: No issues  Patient / Family Perceptions, Expectations & Goals Pt/Family understanding of illness & functional limitations: Pt relies upon wife to talk with the MD and make sure he is understanding his treatment plan. Wife is here daily and talks with the MD and feels her concerns and questions are being addressed. Still adjusting to being on rehab. Premorbid pt/family roles/activities: Husband, father, employee, friend, home owner, etc Anticipated changes in roles/activities/participation: resume Pt/family expectations/goals: Pt states: " I want to do well here."  Wife states: " I hope he can make as much progress as possible while here, we will do whatever he needs for us to do."  [employer] IncCommunity Resources Community Agencies: None Premorbid Home Care/DME Agencies: None Transportation available at discharge: [employer]Family Resource referrals recommended: Neuropsychology, Support group (specify)  Discharge Planning Living Arrangements: Spouse/significant other, Children Support Systems: Spouse/significant other, Children, Other relatives, Manufacturing engineerriends/neighbors, Psychologist, clinicalChurch/faith community Type of Residence: Private residence Insurance Resources: Media plannerrivate Insurance (specify) (Anthem BCBS & Community education officerAetna) Surveyor, quantityinancial Resources: Employment, Family Support Financial Screen Referred: No Living Expenses: Own Money Management: Spouse, Patient Does the patient have any  problems obtaining your medications?: No Home Management: Both pt and wife  Patient/Family Preliminary Plans:  Return home with wife and son who has recently moved back to assist him. Along with a retired Hotel managermilitary friend who will be helping him during the day while both son and wife work. All three plan to be here and be involved in his care while here. Social Work Anticipated Follow Up Needs: HH/OP, Support Group  Clinical Impression Pleasant gentleman who is motivated and willing to work hard in therapies to recover from this event. His wife, son and friend are very supportive and involved and will be assisting him at discharge. They are already building a ramp and renovating their bathroom for him when he is discharged. Will work on discharge needs and refer pt to neuro-psych when appropriate.  Lucy Chrisupree, Terrisha Lopata G 12/30/2015, 1:34 PM

## 2015-12-31 ENCOUNTER — Inpatient Hospital Stay (HOSPITAL_COMMUNITY): Payer: BLUE CROSS/BLUE SHIELD | Admitting: Speech Pathology

## 2015-12-31 ENCOUNTER — Inpatient Hospital Stay (HOSPITAL_COMMUNITY): Payer: BLUE CROSS/BLUE SHIELD | Admitting: Physical Therapy

## 2015-12-31 ENCOUNTER — Inpatient Hospital Stay (HOSPITAL_COMMUNITY): Payer: BLUE CROSS/BLUE SHIELD | Admitting: Occupational Therapy

## 2015-12-31 LAB — GLUCOSE, CAPILLARY
GLUCOSE-CAPILLARY: 172 mg/dL — AB (ref 65–99)
GLUCOSE-CAPILLARY: 145 mg/dL — AB (ref 65–99)
GLUCOSE-CAPILLARY: 158 mg/dL — AB (ref 65–99)
Glucose-Capillary: 174 mg/dL — ABNORMAL HIGH (ref 65–99)

## 2015-12-31 MED ORDER — MENTHOL 3 MG MT LOZG
1.0000 | LOZENGE | OROMUCOSAL | Status: DC | PRN
  Filled 2015-12-31: qty 9

## 2015-12-31 NOTE — Progress Notes (Signed)
Patient continues to have brady HR per Dinamap recording and apical auscultation by this Clinical research associatewriter. Pt is asymptomatic. MD is aware., prior EKG with NNO. Will continue to monitor.

## 2015-12-31 NOTE — Progress Notes (Signed)
Speech Language Pathology Daily Session Note  Patient Details  Name: James Holden Camille MRN: [id number] Date of Birth: 01/05/1961  Today's Date: 12/31/2015 SLP Individual Time: 0805-0900 SLP Individual Time Calculation (min): 55 min   Short Term Goals:Week 1: SLP Short Term Goal 1 (Week 1): Patient will maintain eye contact at midline to conversation partner during functional conversation/task for ~30 seconds in 25% of opportunities  SLP Short Term Goal 2 (Week 1): Patient will perform visual scanning tasks to locate items at midline with Max A multimodal cues in 25% of opportunities.  SLP Short Term Goal 3 (Week 1): Patient will identify 2 cognitive deficits with Max A multimodal cues.  SLP Short Term Goal 4 (Week 1): Patient will demonstrate sustained attention to a functional task for ~5 minutes with Mod A verbal cues for redirection.  SLP Short Term Goal 5 (Week 1): Patient will consume trials of thin liquids without overt s/s of aspiration with Min A verbal cues for use of small sips over 2 sessions to assess readiness for repeat MBS.  SLP Short Term Goal 6 (Week 1): Patient will consume current diet without overt s/s of aspiration with Mod A verbal cues for use of swallowing compensatory strategies.   Skilled Therapeutic Interventions: Pt was seen for skilled ST targeting goals for cognition and dysphagia.  Pt consumed dys 2 textures and nectar thick liquids with supervision verbal cues for use of swallowing precautions.  No overt s/s of aspiration were evident with solids or liquids.  Recommend repeat objective study at next available appointment to objectively determine readiness to advance.  Throughout meal, pt needed mod assist verbal and visual cues to locate items on the left side of his tray.  Pt also needed min-mod assist verbal cues for redirection to task due to distraction to both internal and external stimuli.  Pt was able to sustain his attention to a basic, familiar task for 3-5  minute intervals with the abovementioned cues.  Pt was left in bed with call bell within reach and bed alarm set for safety.  Continue per current plan of care.       Function:  Eating Eating   Modified Consistency Diet: Yes Eating Assist Level: Set up assist for;Supervision or verbal cues   Eating Set Up Assist For: Opening containers       Cognition Comprehension Comprehension assist level: Understands basic 90% of the time/cues < 10% of the time  Expression   Expression assist level: Expresses basic 90% of the time/requires cueing < 10% of the time.  Social Interaction Social Interaction assist level: Interacts appropriately 50 - 74% of the time - May be physically or verbally inappropriate.  Problem Solving Problem solving assist level: Solves basic 50 - 74% of the time/requires cueing 25 - 49% of the time  Memory Memory assist level: Recognizes or recalls 50 - 74% of the time/requires cueing 25 - 49% of the time    Pain Pain Assessment Pain Assessment: No/denies pain   Therapy/Group: Individual Therapy  Kameo Bains, Melanee SpryNicole L 12/31/2015, 9:13 AM

## 2015-12-31 NOTE — Progress Notes (Signed)
Subjective/Complaints: No issues pt without dizziness or tiredness. Intake fair 25-50% Sore throat, no cough or congestion ROS  - CP/SOB, N/V/D  Objective: Vital Signs: Blood pressure (!) 150/52, pulse (!) 58, temperature 98.4 F (36.9 C), temperature source Oral, resp. rate 18, height _0  (1.778 m), weight 91.7 kg (202 lb 2.6 oz), SpO2 100 %. No results found. Results for orders placed or performed during the hospital encounter of 12/27/15 (from the past 72 hour(s))  Glucose, capillary     Status: Abnormal   Collection Time: 12/28/15 11:41 AM  Result Value Ref Range   Glucose-Capillary 206 (H) 65 - 99 mg/dL  Glucose, capillary     Status: Abnormal   Collection Time: 12/28/15  4:19 PM  Result Value Ref Range   Glucose-Capillary 158 (H) 65 - 99 mg/dL  Glucose, capillary     Status: Abnormal   Collection Time: 12/28/15  9:44 PM  Result Value Ref Range   Glucose-Capillary 166 (H) 65 - 99 mg/dL  Glucose, capillary     Status: Abnormal   Collection Time: 12/29/15  6:45 AM  Result Value Ref Range   Glucose-Capillary 191 (H) 65 - 99 mg/dL   Comment 1 Notify RN   Glucose, capillary     Status: Abnormal   Collection Time: 12/29/15 12:27 PM  Result Value Ref Range   Glucose-Capillary 199 (H) 65 - 99 mg/dL  Glucose, capillary     Status: Abnormal   Collection Time: 12/29/15  4:53 PM  Result Value Ref Range   Glucose-Capillary 233 (H) 65 - 99 mg/dL  Glucose, capillary     Status: Abnormal   Collection Time: 12/29/15  8:26 PM  Result Value Ref Range   Glucose-Capillary 165 (H) 65 - 99 mg/dL  Glucose, capillary     Status: Abnormal   Collection Time: 12/30/15  6:43 AM  Result Value Ref Range   Glucose-Capillary 156 (H) 65 - 99 mg/dL  CBC WITH DIFFERENTIAL     Status: Abnormal   Collection Time: 12/30/15 10:02 AM  Result Value Ref Range   WBC 6.8 4.0 - 10.5 K/uL   RBC 4.15 (L) 4.22 - 5.81 MIL/uL   Hemoglobin 11.4 (L) 13.0 - 17.0 g/dL   HCT 36.0 (L) 39.0 - 52.0 %   MCV 86.7  78.0 - 100.0 fL   MCH 27.5 26.0 - 34.0 pg   MCHC 31.7 30.0 - 36.0 g/dL   RDW 15.6 (H) 11.5 - 15.5 %   Platelets 308 150 - 400 K/uL   Neutrophils Relative % 59 %   Neutro Abs 4.0 1.7 - 7.7 K/uL   Lymphocytes Relative 32 %   Lymphs Abs 2.2 0.7 - 4.0 K/uL   Monocytes Relative 6 %   Monocytes Absolute 0.4 0.1 - 1.0 K/uL   Eosinophils Relative 3 %   Eosinophils Absolute 0.2 0.0 - 0.7 K/uL   Basophils Relative 0 %   Basophils Absolute 0.0 0.0 - 0.1 K/uL  Comprehensive metabolic panel     Status: Abnormal   Collection Time: 12/30/15 10:02 AM  Result Value Ref Range   Sodium 131 (L) 135 - 145 mmol/L   Potassium 4.8 3.5 - 5.1 mmol/L   Chloride 95 (L) 101 - 111 mmol/L   CO2 25 22 - 32 mmol/L   Glucose, Bld 244 (H) 65 - 99 mg/dL   BUN 20 6 - 20 mg/dL   Creatinine, Ser 1.01 0.61 - 1.24 mg/dL   Calcium 9.4 8.9 - 10.3 mg/dL  Total Protein 7.8 6.5 - 8.1 g/dL   Albumin 3.2 (L) 3.5 - 5.0 g/dL   AST 38 15 - 41 U/L   ALT 40 17 - 63 U/L   Alkaline Phosphatase 92 38 - 126 U/L   Total Bilirubin 0.6 0.3 - 1.2 mg/dL   GFR calc non Af Amer >60 >60 mL/min   GFR calc Af Amer >60 >60 mL/min    Comment: (NOTE) The eGFR has been calculated using the CKD EPI equation. This calculation has not been validated in all clinical situations. eGFR's persistently <60 mL/min signify possible Chronic Kidney Disease.    Anion gap 11 5 - 15  Glucose, capillary     Status: Abnormal   Collection Time: 12/30/15 11:54 AM  Result Value Ref Range   Glucose-Capillary 183 (H) 65 - 99 mg/dL  Glucose, capillary     Status: Abnormal   Collection Time: 12/30/15  4:31 PM  Result Value Ref Range   Glucose-Capillary 125 (H) 65 - 99 mg/dL  Glucose, capillary     Status: Abnormal   Collection Time: 12/30/15  9:33 PM  Result Value Ref Range   Glucose-Capillary 144 (H) 65 - 99 mg/dL  Glucose, capillary     Status: Abnormal   Collection Time: 12/31/15  6:32 AM  Result Value Ref Range   Glucose-Capillary 174 (H) 65 - 99  mg/dL      General: No acute distress ENT- former trach site CDI, tongue is moist, difficult to visualize pharynx, no cervical adenopathy Mood and affect are appropriate Heart: Regular rate no rubs murmurs , + ectopy Lungs: Clear to auscultation, breathing unlabored, no rales or wheezes Abdomen: Positive bowel sounds, soft nontender to palpation, nondistended, PEG site CDI Extremities: No clubbing, cyanosis, or edema Skin: No evidence of breakdown, no evidence of rash Neurologic: Cranial nerves II through XII intact, motor strength is 5/5 in RIght  deltoid, bicep, tricep, grip, hip flexor, knee extensors, ankle dorsiflexor and plantar flexor, 0/5 on left side Sensory exam normal sensation to light touch and proprioception in right upper and lower extremities Feels pinch Left toes but not left fingers Musculoskeletal: Full range of motion in all 4 extremities. No joint swelling   Assessment/Plan: 1. Functional deficits secondary to RIght MCA infarct with left hemiplegia which require 3+ hours per day of interdisciplinary therapy in a comprehensive inpatient rehab setting. Physiatrist is providing close team supervision and 24 hour management of active medical problems listed below. Physiatrist and rehab team continue to assess barriers to discharge/monitor patient progress toward functional and medical goals. FIM: Function - Bathing Position: Wheelchair/chair at sink Body parts bathed by patient: Chest, Abdomen, Left arm Body parts bathed by helper: Right arm, Front perineal area, Buttocks, Right upper leg, Left upper leg, Right lower leg, Left lower leg, Back Bathing not applicable: Right lower leg Assist Level: 2 helpers  Function- Upper Body Dressing/Undressing What is the patient wearing?: Pull over shirt/dress Pull over shirt/dress - Perfomed by helper: Thread/unthread right sleeve, Thread/unthread left sleeve, Put head through opening, Pull shirt over trunk Assist Level: 2  helpers Function - Lower Body Dressing/Undressing What is the patient wearing?: Pants, Non-skid slipper socks Position: Bed Pants- Performed by helper: Thread/unthread right pants leg, Thread/unthread left pants leg, Pull pants up/down Non-skid slipper socks- Performed by helper: Don/doff right sock, Don/doff left sock Socks - Performed by helper: Don/doff left sock, Don/doff right sock Assist for footwear: Dependant Assist for lower body dressing:  (total A)  Function - Toileting Toileting  steps completed by helper: Adjust clothing prior to toileting, Performs perineal hygiene, Adjust clothing after toileting Assist level: Two helpers  Function - Air cabin crew transfer assistive device: Mechanical lift Assist level to toilet: 2 helpers Assist level from toilet: 2 helpers  Function - Chair/bed transfer [place] transfer [place]: Lateral scoot Chair/bed transfer assist level: 2 helpers Chair/bed transfer assistive device: Sliding board Mechanical lift: [place] transfer details: Manual facilitation for weight shifting, Manual facilitation for placement, Verbal cues for technique, Verbal cues for safe use of DME/AE, Manual facilitation for weight bearing  Function - Locomotion: Wheelchair Will patient use wheelchair at discharge?: Yes Type: Manual Max wheelchair distance: 25 Assist Level: Moderate assistance (Pt 50 - 74%) Assist Level: Maximal assistance (Pt 25 - 49%) Wheel 150 feet activity did not occur: Safety/medical concerns Turns around,maneuvers to table,bed, and toilet,negotiates 3% grade,maneuvers on rugs and over doorsills: No Function - Locomotion: Ambulation Ambulation activity did not occur: Safety/medical concerns Walk 10 feet activity did not occur: Safety/medical concerns Walk 50 feet with 2 turns activity did not occur: Safety/medical concerns Walk 150 feet activity did not occur: Safety/medical concerns Walk 10 feet on uneven surfaces activity  did not occur: Safety/medical concerns  Function - Comprehension Comprehension: Auditory Comprehension assist level: Understands basic 75 - 89% of the time/ requires cueing 10 - 24% of the time  Function - Expression Expression: Verbal Expression assist level: Expresses basic 75 - 89% of the time/requires cueing 10 - 24% of the time. Needs helper to occlude trach/needs to repeat words.  Function - Social Interaction Social Interaction assist level: Interacts appropriately 50 - 74% of the time - May be physically or verbally inappropriate.  Function - Problem Solving Problem solving assist level: Solves basic 25 - 49% of the time - needs direction more than half the time to initiate, plan or complete simple activities  Function - Memory Memory assist level: Recognizes or recalls 25 - 49% of the time/requires cueing 50 - 75% of the time Patient normally able to recall (first 3 days only): Current season, That he or she is in a hospital  Medical Problem List and Plan: 1.  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 Helmet when out of bed.             -CIR, PT, OT SLP 2.  DVT Prophylaxis/Anticoagulation: Subcutaneous heparin for DVT prophylaxis 3. Pain Management/chronic back pain: Oxycodone 5 mg every 6 hours as needed 4. Mood: Prozac 40 mg daily.  Abilify 5 mg daily 5. Neuropsych: This patient is capable of making decisions on his own behalf. 6. Skin/Wound Care: Routine skin checks 7. Fluids/Electrolytes/Nutrition: Routine I&O with follow-up chemistries upon admit. 8. Tracheostomy 12/03/2015. Decannulated 12/25/2015- no resp distress 9. Gastrostomy PEG tube 12/05/2015. Nocturnal tube feeds recently discontinued. Currently on dysphagia #2 nectar liquid. Good intake this am per RN             -regular PEG maintenance, may d/c after 01/19/2016 10. Hypertension. Hydralazine 10 mg every 8 hours, amlodipine 5 mg daily, lisinopril 30 mg daily.  Monitor with increased mobility in therapy 11. Diabetes mellitus. Lantus insulin 19 units daily at bedtime.will increase to 22U Check blood sugars before meals and at bedtime,             - CBG (last 3)   Recent Labs  12/30/15 1631 12/30/15 2133 12/31/15 0632  GLUCAP 125* 144* 174*     12. Hyperlipidemia. Lipitor 80 mg daily at  bedtime 13.Tobacco abuse. Counseling as appropriate 14.  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  Sore throat , afebrile, start cepacol LOS (Days) 4 A FACE TO FACE EVALUATION WAS PERFORMED  Saraphina Lauderbaugh E 12/31/2015, 7:45 AM

## 2015-12-31 NOTE — Progress Notes (Signed)
Physical Therapy Session Note  Patient Details  Name: James Holden MRN: [id number] Date of Birth: [birth year]  Today's Date: 12/31/2015 PT Individual Time: 1325-1340 PT Individual Time Calculation (min): 15 min  and Today's Date: 12/31/2015 PT Missed Time: 24 Minutes Missed Time Reason: Patient fatigue (pt unable to keep eyes open >70 seconds)    Short Term Goals: Week 1:  PT Short Term Goal 1 (Week 1): Patient will perfom all bed mobility consistently with Max Assist.  PT Short Term Goal 2 (Week 1): Patient will transfer with +1 consistently with LRAD  PT Short Term Goal 3 (Week 1): patient will initate standing balance PT Short Term Goal 4 (Week 1): Patient will be able to perform sitting balance without UE support with min assist  PT Short Term Goal 5 (Week 1): Patient will perform WC mobility with consistent mod assist x 157f    Skilled Therapeutic Interventions/Progress Updates:    Pt sleeping soundly in bed on arrival at 1300, awakes to moderate tactile cuing and agreeable to therapy, but unable to keep eyes open during conversation.  PT returned at 1325 and pt wakes to verbal cues, and again agreeable to therapy with no c/o pain.  Pt engaged in simple sorting tasks focus on visual scanning and tracking, L attention, and cognition.  Pt able to sort 4 bean bags with more than a reasonable amount of time and mod multimodal cues.  Pt continued to be lethargic throughout task, unable to stay awake greater than one minute at a time despite verbal cues.  Pt left positioned supine in bed with call bell in reach, bed alarm set, needs met.  Missed 45 minutes of skilled PT.   Therapy Documentation Precautions:  Precautions Precautions: Fall Precaution Comments: L hemiplegia, R hemicraniotomy  Required Braces or Orthoses: Other Brace/Splint Other Brace/Splint: Helmet when OOB Restrictions Weight Bearing Restrictions: No General: PT Amount of Missed Time (min): 45 Minutes PT Missed  Treatment Reason: Patient fatigue (pt unable to keep eyes open >70 seconds)   See Function Navigator for Current Functional Status.   Therapy/Group: Individual Therapy  [name] 12/31/2015, 1:45 PM

## 2015-12-31 NOTE — Progress Notes (Signed)
Occupational Therapy Session Note  Patient Details  Name: James Holden George Spoerl MRN: [id number] Date of Birth: 12/17/1960  Today's Date: 12/31/2015 OT Individual Time: 1000-1110 OT Individual Time Calculation (min): 70 min     Short Term Goals: Week 1:  OT Short Term Goal 1 (Week 1): Pt will attend to L side of body to wash L arm with Mod instructional cues OT Short Term Goal 2 (Week 1): Pt will don shirt with max A using hemi techniques OT Short Term Goal 3 (Week 1): Pt will tolerate giv-more-sling on L UE when OOB  Skilled Therapeutic Interventions/Progress Updates:    Upon entering the room, pt supine in bed with no c/o pain. Skilled OT intervention with focus on self care, weight shifting, balance, and attending to L environment. LB bathing and dressing performed from supine with mod A of 1. Pt rolling L <> R with max A. Total A to don LB clothing. Supine >sit with total A to EOB. Pt able to locate items at midline 75% of the time. Even with assist, pt unable to head turn to L enough to locate some items. Pt does not track with L eye at all during session. Unsure if pt has vision from any quadrant of L eye based on functional tasks. Squat pivot transfer with total A of 1 and assist of second helper to steady equipment. Pt engaged in UB bathing and dressing from wheelchair with total A for hemiplegic dressing. Max multimodal cues to attend to L UE for safety awareness. Lap tray placed and quick release belt donned. Pt placed at RN station for safety.  Therapy Documentation Precautions:  Precautions Precautions: Fall Precaution Comments: L hemiplegia, R hemicraniotomy  Required Braces or Orthoses: Other Brace/Splint Other Brace/Splint: Helmet when OOB Restrictions Weight Bearing Restrictions: No General: General PT Missed Treatment Reason: Patient fatigue (pt unable to keep eyes open >70 seconds) Vital Signs: Therapy Vitals Temp: 97.3 F (36.3 C) Temp Source: Oral Resp: 18 BP: (!)  111/50 Patient Position (if appropriate): Lying Oxygen Therapy SpO2: 99 % O2 Device: Not Delivered Pain: Pain Assessment Faces Pain Scale: Hurts whole lot Pain Type: Acute pain Pain Location: Head Pain Descriptors / Indicators: Aching Pain Intervention(s): Medication (See eMAR) ADL: ADL ADL Comments: Please see functional navigator Exercises:   Other Treatments:    See Function Navigator for Current Functional Status.   Therapy/Group: Individual Therapy  Alen BleacherBradsher, Conner Muegge P 12/31/2015, 2:01 PM

## 2016-01-01 ENCOUNTER — Inpatient Hospital Stay (HOSPITAL_COMMUNITY): Payer: BLUE CROSS/BLUE SHIELD

## 2016-01-01 ENCOUNTER — Inpatient Hospital Stay (HOSPITAL_COMMUNITY): Payer: BLUE CROSS/BLUE SHIELD | Admitting: Physical Therapy

## 2016-01-01 ENCOUNTER — Inpatient Hospital Stay (HOSPITAL_COMMUNITY): Payer: BLUE CROSS/BLUE SHIELD | Admitting: Occupational Therapy

## 2016-01-01 ENCOUNTER — Inpatient Hospital Stay (HOSPITAL_COMMUNITY): Payer: BLUE CROSS/BLUE SHIELD | Admitting: Speech Pathology

## 2016-01-01 LAB — GLUCOSE, CAPILLARY
Glucose-Capillary: 148 mg/dL — ABNORMAL HIGH (ref 65–99)
Glucose-Capillary: 154 mg/dL — ABNORMAL HIGH (ref 65–99)
Glucose-Capillary: 148 mg/dL — ABNORMAL HIGH (ref 65–99)
Glucose-Capillary: 162 mg/dL — ABNORMAL HIGH (ref 65–99)

## 2016-01-01 MED ORDER — TRAZODONE HCL 50 MG PO TABS
50.0000 mg | ORAL_TABLET | Freq: Every evening | ORAL | Status: DC | PRN
  Administered 2016-01-01: 50 mg via ORAL
  Filled 2016-01-01: qty 1

## 2016-01-01 MED ORDER — TRAZODONE HCL 50 MG PO TABS
25.0000 mg | ORAL_TABLET | Freq: Every evening | ORAL | Status: DC | PRN
  Administered 2016-01-02 – 2016-01-10 (×9): 50 mg via ORAL
  Filled 2016-01-01 (×9): qty 1

## 2016-01-01 NOTE — Progress Notes (Signed)
Physical Therapy Note  Patient Details  Name: Janey GentaJarvis George Bertino MRN: [id number] Date of Birth: 04/14/1960 Today's Date: 01/01/2016  1400-1500, 60 min individual tx Pain: unrated, L hip joint, decreased with gentle PROM  Session focused on participation, L attention and awareness, LLE wt bearing.  W/c propulsion using hemi method with min assist for L attention.  Pt declined using Stedy for standing activity, stating he was exhausted and wanted to go back to bed.  Pt eventually agreed to do seated activity using L hand for gross assist with PT's help, during removal of Rx bottle lids.  L visual attention deficits limited his performance but he did improve in looking L during activity.  +2 for Stedy to return to bed, with pt leaning heavily to L when seated on high seat.  neuromuscular re-education via multimodal cues, in supine: L heel slides, L ankle PF, bil hip abd/adduction x 10 each.  Pt left resting in bed with all needs within reach; NT entered room to take vitals.Wanda Plump.   Skyelynn Rambeau 01/01/2016, 4:13 PM

## 2016-01-01 NOTE — Progress Notes (Signed)
Social Work Patient ID: James Holden, male   DOB: 03/21/1960, 55 y.o.   MRN: [id number]  Spoke with wife via telephone to inform of team conference goals-min-mod assist and target discharge 12/5. Discussed the progress he has made this week and wife reports she can see it when she is here visiting. She and her son along with their friend will come in for education prior to pt's discharge. They are aware he will require 24 hr physical care upon discharge and are willing to provide this. Will continue to work on discharge planning and provide support.

## 2016-01-01 NOTE — Patient Care Conference (Signed)
Inpatient RehabilitationTeam Conference and Plan of Care Update Date: 01/01/2016   Time: 10:40 AM    Patient Name: James Holden      Medical Record Number: [id number]  Date of Birth: 10/17/1960 Sex: Male         Room/Bed: 4W26C/4W26C-01 Payor Info: Payor: BLUE CROSS BLUE SHIELD / Plan: BCBS OTHER / Product Type: *No Product type* /    Admitting Diagnosis: CVA With Craniotomy  Admit Date/Time:  12/27/2015  2:13 PM Admission Comments: No comment available   Primary Diagnosis:  <principal problem not specified> Principal Problem: <principal problem not specified>  Patient Active Problem List   Diagnosis Date Noted  . 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 Claudette LawsAndrew Kirsteins Social Worker Present: Dossie DerBecky Martavious Hartel, LCSW Nurse Present: Carmie EndAngie Joyce, RN PT Present: Wanda Plumparoline Cook, PT;Austin Pricilla Holmucker, Nita SicklePT;Rodney Wishart, PT OT Present: Callie FieldingKatie Pittman, OT SLP Present: Jackalyn LombardNicole Page, SLP PPS Coordinator present : Tora DuckMarie Noel, RN, CRRN     Current Status/Progress Goal Weekly Team Focus  Medical   dense hemiparesis, poor awareness, good appetite,  home with family  attention and concentration   Bowel/Bladder   cont vs incont. mainly QHS bladder incontience  decrease QHS incontinence,   decrease episodes of QHS incontinence   Swallow/Nutrition/ Hydration   dys 2, nectar thick liquids; water protocol    supervision   working towards advnacement pending consistent use of swallowing precautions   ADL's    max - Total A +2, pt can be very self limiting during sessions, severe L field cut  min A overall  ADL retraining, cognition, functional transfers, pt/faily education ,NMR, vision   Mobility   +2 max assist SB transfers and sit<>stand with use of Stedy lift. Max Assist bed mobility. Min assist WC mobillity with R hemi technique. Patient continues to dislpay  severe L neglect and only able to attend to L side with maximal cues  mod assist with transfers, supervision with WC mobility.   attention to L side. increased use of LLE. improved independce with bed mobility, transfers, initating gait training.    Communication   mild dysarthria   mod I   education and carryover of intelligibility strategies in conversations    Safety/Cognition/ Behavioral Observations  max assist   min assist   left inattention, sustained attention to tasks, awareness of deficits    Pain   pt utilizes prn OXY IR 1-2 daily  Maintain pain management with prn medication  Pt will have pain managed with prn medication   Skin   Skin is CDI, incision site healed  Skin integrity maintained  No new skin breakdown      *See Care Plan and progress notes for long and short-term goals.  Barriers to Discharge: heavy assist    Possible Resolutions to Barriers:  cont rehab    Discharge Planning/Teaching Needs:  HOme with wife and son who work but will have friend who is retired to assist while they are working. Building ramp and renovating bathroom at home      Team Discussion:  Goals min-mod level of assist. Currently max plus 3 with sara lift. L-severe neglect and awareness issues. Peg not being used. Dizzy when standing. Small sips-otherwise aspirates-cognitive limits him and attention and awareness issues. L-eye visual issues-field cut. Will need 24 hr physical care  Revisions to Treatment Plan:  New eval   Continued Need for Acute Rehabilitation  Level of Care: The patient requires daily medical management by a physician with specialized training in physical medicine and rehabilitation for the following conditions: Daily medical management of patient stability for increased activity during participation in an intensive rehabilitation regime.: Yes Daily analysis of laboratory values and/or radiology reports with any subsequent need for medication adjustment of medical  intervention for : Neurological problems;Mood/behavior problems;Blood pressure problems  Irish Breisch, Lemar LivingsRebecca G 01/02/2016, 8:28 AM

## 2016-01-01 NOTE — Progress Notes (Signed)
Subjective/Complaints: In good spirits , ate 100% breakfast ROS  - CP/SOB, N/V/D  Objective: Vital Signs: Blood pressure (!) 122/57, pulse (!) 47, temperature 97.5 F (36.4 C), temperature source Oral, resp. rate 18, height _0  (1.778 m), weight 92.6 kg (204 lb 2.3 oz), SpO2 100 %. No results found. Results for orders placed or performed during the hospital encounter of 12/27/15 (from the past 72 hour(s))  Glucose, capillary     Status: Abnormal   Collection Time: 12/29/15 12:27 PM  Result Value Ref Range   Glucose-Capillary 199 (H) 65 - 99 mg/dL  Glucose, capillary     Status: Abnormal   Collection Time: 12/29/15  4:53 PM  Result Value Ref Range   Glucose-Capillary 233 (H) 65 - 99 mg/dL  Glucose, capillary     Status: Abnormal   Collection Time: 12/29/15  8:26 PM  Result Value Ref Range   Glucose-Capillary 165 (H) 65 - 99 mg/dL  Glucose, capillary     Status: Abnormal   Collection Time: 12/30/15  6:43 AM  Result Value Ref Range   Glucose-Capillary 156 (H) 65 - 99 mg/dL  CBC WITH DIFFERENTIAL     Status: Abnormal   Collection Time: 12/30/15 10:02 AM  Result Value Ref Range   WBC 6.8 4.0 - 10.5 K/uL   RBC 4.15 (L) 4.22 - 5.81 MIL/uL   Hemoglobin 11.4 (L) 13.0 - 17.0 g/dL   HCT 36.0 (L) 39.0 - 52.0 %   MCV 86.7 78.0 - 100.0 fL   MCH 27.5 26.0 - 34.0 pg   MCHC 31.7 30.0 - 36.0 g/dL   RDW 15.6 (H) 11.5 - 15.5 %   Platelets 308 150 - 400 K/uL   Neutrophils Relative % 59 %   Neutro Abs 4.0 1.7 - 7.7 K/uL   Lymphocytes Relative 32 %   Lymphs Abs 2.2 0.7 - 4.0 K/uL   Monocytes Relative 6 %   Monocytes Absolute 0.4 0.1 - 1.0 K/uL   Eosinophils Relative 3 %   Eosinophils Absolute 0.2 0.0 - 0.7 K/uL   Basophils Relative 0 %   Basophils Absolute 0.0 0.0 - 0.1 K/uL  Comprehensive metabolic panel     Status: Abnormal   Collection Time: 12/30/15 10:02 AM  Result Value Ref Range   Sodium 131 (L) 135 - 145 mmol/L   Potassium 4.8 3.5 - 5.1 mmol/L   Chloride 95 (L) 101 - 111  mmol/L   CO2 25 22 - 32 mmol/L   Glucose, Bld 244 (H) 65 - 99 mg/dL   BUN 20 6 - 20 mg/dL   Creatinine, Ser 1.01 0.61 - 1.24 mg/dL   Calcium 9.4 8.9 - 10.3 mg/dL   Total Protein 7.8 6.5 - 8.1 g/dL   Albumin 3.2 (L) 3.5 - 5.0 g/dL   AST 38 15 - 41 U/L   ALT 40 17 - 63 U/L   Alkaline Phosphatase 92 38 - 126 U/L   Total Bilirubin 0.6 0.3 - 1.2 mg/dL   GFR calc non Af Amer >60 >60 mL/min   GFR calc Af Amer >60 >60 mL/min    Comment: (NOTE) The eGFR has been calculated using the CKD EPI equation. This calculation has not been validated in all clinical situations. eGFR's persistently <60 mL/min signify possible Chronic Kidney Disease.    Anion gap 11 5 - 15  Glucose, capillary     Status: Abnormal   Collection Time: 12/30/15 11:54 AM  Result Value Ref Range   Glucose-Capillary 183 (H)  65 - 99 mg/dL  Glucose, capillary     Status: Abnormal   Collection Time: 12/30/15  4:31 PM  Result Value Ref Range   Glucose-Capillary 125 (H) 65 - 99 mg/dL  Glucose, capillary     Status: Abnormal   Collection Time: 12/30/15  9:33 PM  Result Value Ref Range   Glucose-Capillary 144 (H) 65 - 99 mg/dL  Glucose, capillary     Status: Abnormal   Collection Time: 12/31/15  6:32 AM  Result Value Ref Range   Glucose-Capillary 174 (H) 65 - 99 mg/dL  Glucose, capillary     Status: Abnormal   Collection Time: 12/31/15 11:36 AM  Result Value Ref Range   Glucose-Capillary 172 (H) 65 - 99 mg/dL  Glucose, capillary     Status: Abnormal   Collection Time: 12/31/15  4:19 PM  Result Value Ref Range   Glucose-Capillary 145 (H) 65 - 99 mg/dL  Glucose, capillary     Status: Abnormal   Collection Time: 12/31/15  8:43 PM  Result Value Ref Range   Glucose-Capillary 158 (H) 65 - 99 mg/dL  Glucose, capillary     Status: Abnormal   Collection Time: 01/01/16  6:55 AM  Result Value Ref Range   Glucose-Capillary 148 (H) 65 - 99 mg/dL      General: No acute distress ENT- former trach site CDI, tongue is moist,  difficult to visualize pharynx, no cervical adenopathy Mood and affect are appropriate Heart: Regular rate no rubs murmurs , + ectopy Lungs: Clear to auscultation, breathing unlabored, no rales or wheezes Abdomen: Positive bowel sounds, soft nontender to palpation, nondistended, PEG site CDI Extremities: No clubbing, cyanosis, or edema Skin: No evidence of breakdown, no evidence of rash Neurologic: Cranial nerves II through XII intact, motor strength is 5/5 in RIght  deltoid, bicep, tricep, grip, hip flexor, knee extensors, ankle dorsiflexor and plantar flexor, 0/5 on left side except 2-/5 Left hip knee synergy Sensory exam normal sensation to light touch and proprioception in right upper and lower extremities  Musculoskeletal: Full range of motion in all 4 extremities. No joint swelling   Assessment/Plan: 1. Functional deficits secondary to RIght MCA infarct with left hemiplegia which require 3+ hours per day of interdisciplinary therapy in a comprehensive inpatient rehab setting. Physiatrist is providing close team supervision and 24 hour management of active medical problems listed below. Physiatrist and rehab team continue to assess barriers to discharge/monitor patient progress toward functional and medical goals. FIM: Function - Bathing Position: Other (comment) (Lb from bed level and Ub at sink) Body parts bathed by patient: Chest, Abdomen, Left arm, Front perineal area, Buttocks, Right upper leg, Left upper leg Body parts bathed by helper: Right arm, Right lower leg, Left lower leg, Back Bathing not applicable: Right lower leg Assist Level:  (mod A)  Function- Upper Body Dressing/Undressing What is the patient wearing?: Pull over shirt/dress Pull over shirt/dress - Perfomed by helper: Thread/unthread right sleeve, Thread/unthread left sleeve, Put head through opening, Pull shirt over trunk Assist Level:  (total a) Function - Lower Body Dressing/Undressing What is the patient  wearing?: Pants, Non-skid slipper socks Position: Bed Pants- Performed by helper: Thread/unthread right pants leg, Thread/unthread left pants leg, Pull pants up/down Non-skid slipper socks- Performed by helper: Don/doff right sock, Don/doff left sock Socks - Performed by helper: Don/doff left sock, Don/doff right sock Assist for footwear: Dependant Assist for lower body dressing:  (total)  Function - Toileting Toileting steps completed by helper: Adjust clothing prior to  toileting, Performs perineal hygiene, Adjust clothing after toileting Assist level: Two helpers  Function - Air cabin crew transfer assistive device: Mechanical lift Assist level to toilet: 2 helpers Assist level from toilet: 2 helpers  Function - Chair/bed transfer [place] transfer [place]: Lateral scoot Chair/bed transfer assist level: 2 helpers Chair/bed transfer assistive device: Sliding board Mechanical lift: [place] transfer details: Manual facilitation for weight shifting, Manual facilitation for placement, Verbal cues for technique, Verbal cues for safe use of DME/AE, Manual facilitation for weight bearing  Function - Locomotion: Wheelchair Will patient use wheelchair at discharge?: Yes Type: Manual Max wheelchair distance: 25 Assist Level: Moderate assistance (Pt 50 - 74%) Assist Level: Maximal assistance (Pt 25 - 49%) Wheel 150 feet activity did not occur: Safety/medical concerns Turns around,maneuvers to table,bed, and toilet,negotiates 3% grade,maneuvers on rugs and over doorsills: No Function - Locomotion: Ambulation Ambulation activity did not occur: Safety/medical concerns Walk 10 feet activity did not occur: Safety/medical concerns Walk 50 feet with 2 turns activity did not occur: Safety/medical concerns Walk 150 feet activity did not occur: Safety/medical concerns Walk 10 feet on uneven surfaces activity did not occur: Safety/medical concerns  Function -  Comprehension Comprehension: Auditory Comprehension assist level: Understands basic 90% of the time/cues < 10% of the time  Function - Expression Expression: Verbal Expression assist level: Expresses basic 90% of the time/requires cueing < 10% of the time.  Function - Social Interaction Social Interaction assist level: Interacts appropriately 50 - 74% of the time - May be physically or verbally inappropriate.  Function - Problem Solving Problem solving assist level: Solves basic 50 - 74% of the time/requires cueing 25 - 49% of the time  Function - Memory Memory assist level: Recognizes or recalls 50 - 74% of the time/requires cueing 25 - 49% of the time Patient normally able to recall (first 3 days only): Current season, That he or she is in a hospital  Medical Problem List and Plan: 1.  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 Helmet when out of bed.             -CIR, PT, OT SLP, Team conference today please see physician documentation under team conference tab, met with team face-to-face to discuss problems,progress, and goals. Formulized individual treatment plan based on medical history, underlying problem and comorbidities. 2.  DVT Prophylaxis/Anticoagulation: Subcutaneous heparin for DVT prophylaxis 3. Pain Management/chronic back pain: Oxycodone 5 mg every 6 hours as needed 4. Mood: Prozac 40 mg daily.  Abilify 5 mg daily 5. Neuropsych: This patient is capable of making decisions on his own behalf. 6. Skin/Wound Care: Routine skin checks 7. Fluids/Electrolytes/Nutrition: Routine I&O with follow-up chemistries upon admit. 8.H/O  Tracheostomy Decannulated 12/25/2015- no resp distress, stoma healed 9. Gastrostomy PEG tube 12/05/2015. Nocturnal tube feeds recently discontinued. Currently on dysphagia #2 nectar liquid. Good intake this am per RN, hope to upgrade after repeat MBS             -regular PEG maintenance, may d/c after  01/19/2016 10. Hypertension. Hydralazine 10 mg every 8 hours, amlodipine 5 mg daily, lisinopril 30 mg daily. Monitor with increased mobility in therapy 11. Diabetes mellitus. Lantus insulin 22 units daily at bedtime.improving   Check blood sugars before meals and at bedtime,             - CBG (last 3)   Recent Labs  12/31/15 1619 12/31/15 2043 01/01/16 0655  GLUCAP 145* 158* 148*  12. Hyperlipidemia. Lipitor 80 mg daily at bedtime 13.Tobacco abuse. Counseling as appropriate 14.  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  Sore throat , afebrile, start cepacol LOS (Days) 5 A FACE TO FACE EVALUATION WAS PERFORMED  Mckinzie Saksa E 01/01/2016, 7:58 AM

## 2016-01-01 NOTE — Progress Notes (Signed)
Occupational Therapy Session Note  Patient Details  Name: James Holden MRN: [id number] Date of Birth: 09/26/1960  Today's Date: 01/01/2016 OT Individual Time: [id number] OT Individual Time Calculation (min): 58 min     Short Term Goals: Week 1:  OT Short Term Goal 1 (Week 1): Pt will attend to L side of body to wash L arm with Mod instructional cues OT Short Term Goal 2 (Week 1): Pt will don shirt with max A using hemi techniques OT Short Term Goal 3 (Week 1): Pt will tolerate giv-more-sling on L UE when OOB  Skilled Therapeutic Interventions/Progress Updates:     Upon entering the room, pt in bed with RN providing assistance with meal. Pt with no c/o pain while in bed. Pt performed supine> sit with mod A for L LE and trunk to EOB. Pt performed sit <>stand from EOB into STEDY with max A. Pt immediately becoming upset while standing in STEDY. He became very anxious that he may fall and was unable to follow max multimidal cues for safety. Pt strongly pushing with R UE to the L while in seated and standing position. Pt refusing to further engage with therapy until he was returned to bed. Initially refusing to stand from elevated STEDY seat to return to bed but performed task with mod A. Pt performed mod A for sit >supine. Pt seated with HOB elevated with lunch tray placed in front of him with supervision and min cues for swallowing precautions. Pt needing max cues to locate plate at midline to feed self. Call bell and all needed items within reach upon exiting the room. Bed alarm activated.   Therapy Documentation Precautions:  Precautions Precautions: Fall Precaution Comments: L hemiplegia, R hemicraniotomy  Required Braces or Orthoses: Other Brace/Splint Other Brace/Splint: Helmet when OOB Restrictions Weight Bearing Restrictions: No General:   Vital Signs: Therapy Vitals Temp: 98.3 F (36.8 C) Temp Source: Oral Pulse Rate: (!) 45 Resp: 18 BP: (!) 128/54 Patient Position (if  appropriate): Sitting Oxygen Therapy SpO2: 100 % O2 Device: Not Delivered ADL: ADL ADL Comments: Please see functional navigator  See Function Navigator for Current Functional Status.   Therapy/Group: Individual Therapy  Alen BleacherBradsher, Nate Perri P 01/01/2016, 5:07 PM

## 2016-01-01 NOTE — Progress Notes (Signed)
Physical Therapy Session Note  Patient Details  Name: James Holden MRN: [id number] Date of Birth: 07/20/1960  Today's Date: 01/01/2016 PT Individual Time: 1100-1200   PT Individual Time Calculation: 60min     Short Term Goals: Week 1:  PT Short Term Goal 1 (Week 1): Patient will perfom all bed mobility consistently with Max Assist.  PT Short Term Goal 2 (Week 1): Patient will transfer with +1 consistently with LRAD  PT Short Term Goal 3 (Week 1): patient will initate standing balance PT Short Term Goal 4 (Week 1): Patient will be able to perform sitting balance without UE support with min assist  PT Short Term Goal 5 (Week 1): Patient will perform WC mobility with consistent mod assist x 14300ft    Skilled Therapeutic Interventions/Progress Updates:     Patient received supine in bed. Supine to sitting EOB with with max assist through log roll to the L. With hand over hand assist to find rail on L side   Sitting balance EOB for urination in the urinal. Min-mod assist from PT to prevent constant LOB to the L. . Patient reports discomfort while urinating; RN aware.    SB transfer to R x 3 throughout PT treatment with max assist from PT. With constant cues for proper positioning of the R UE, improve use of BLE, improve anterior weight shift to allow clearance of gluteal region   WC mobility with mod-max assist x 9750ft with R hemi technique. Constant cues for increased attention to the L side with cues to look at target on the L.    Sit<>stand with stedy x 2 with max +2 progressing to Max +1. Constant cues for improve anterior weight shift and purpose of sit<>stand training to allow increased independence and function.   Patient returned to room and performed sit>supine with max assist from PT to management trunk and LLE.   Patient left in bed with NT present.      Therapy Documentation Precautions:  Precautions Precautions: Fall Precaution Comments: L hemiplegia, R  hemicraniotomy  Required Braces or Orthoses: Other Brace/Splint Other Brace/Splint: Helmet when OOB Restrictions Weight Bearing Restrictions: No   See Function Navigator for Current Functional Status.   Therapy/Group: Individual Therapy  Golden Popustin E Regan Llorente 01/01/2016, 1:10 PM

## 2016-01-01 NOTE — Progress Notes (Signed)
Speech Language Pathology Note  Patient Details  Name: James GentaJarvis George Noon MRN: [id number] Date of Birth: 03/20/1960 Today's Date: 01/01/2016  MBSS complete. Full report located under chart review in imaging section.    Jerrol Helmers, Melanee SpryNicole L 01/01/2016, 7:39 PM

## 2016-01-02 ENCOUNTER — Inpatient Hospital Stay (HOSPITAL_COMMUNITY): Payer: BLUE CROSS/BLUE SHIELD

## 2016-01-02 ENCOUNTER — Inpatient Hospital Stay (HOSPITAL_COMMUNITY): Payer: BLUE CROSS/BLUE SHIELD | Admitting: Occupational Therapy

## 2016-01-02 ENCOUNTER — Inpatient Hospital Stay (HOSPITAL_COMMUNITY): Payer: BLUE CROSS/BLUE SHIELD | Admitting: Speech Pathology

## 2016-01-02 LAB — URINALYSIS, ROUTINE W REFLEX MICROSCOPIC
BILIRUBIN URINE: NEGATIVE
Glucose, UA: NEGATIVE mg/dL
Hgb urine dipstick: NEGATIVE
Ketones, ur: NEGATIVE mg/dL
Leukocytes, UA: NEGATIVE
NITRITE: NEGATIVE
Protein, ur: NEGATIVE mg/dL
SPECIFIC GRAVITY, URINE: 1.018 (ref 1.005–1.030)
pH: 7.5 (ref 5.0–8.0)

## 2016-01-02 LAB — GLUCOSE, CAPILLARY
GLUCOSE-CAPILLARY: 174 mg/dL — AB (ref 65–99)
GLUCOSE-CAPILLARY: 150 mg/dL — AB (ref 65–99)
GLUCOSE-CAPILLARY: 168 mg/dL — AB (ref 65–99)
Glucose-Capillary: 197 mg/dL — ABNORMAL HIGH (ref 65–99)

## 2016-01-02 MED ORDER — FREE WATER
50.0000 mL | Freq: Two times a day (BID) | Status: DC
  Administered 2016-01-02 – 2016-01-21 (×38): 50 mL

## 2016-01-02 NOTE — Progress Notes (Signed)
Speech Language Pathology Daily Session Note  Patient Details  Name: James Holden MRN: [id number] Date of Birth: 05/04/1960  Today's Date: 01/02/2016 SLP Individual Time: [id number] SLP Individual Time Calculation (min): 27 min   Short Term Goals: Week 1: SLP Short Term Goal 1 (Week 1): Patient will maintain eye contact at midline to conversation partner during functional conversation/task for ~30 seconds in 25% of opportunities  SLP Short Term Goal 2 (Week 1): Patient will perform visual scanning tasks to locate items at midline with Max A multimodal cues in 25% of opportunities.  SLP Short Term Goal 3 (Week 1): Patient will identify 2 cognitive deficits with Max A multimodal cues.  SLP Short Term Goal 4 (Week 1): Patient will demonstrate sustained attention to a functional task for ~5 minutes with Mod A verbal cues for redirection.  SLP Short Term Goal 5 (Week 1): Patient will consume trials of thin liquids without overt s/s of aspiration with Min A verbal cues for use of small sips over 2 sessions to assess readiness for repeat MBS.  SLP Short Term Goal 6 (Week 1): Patient will consume current diet without overt s/s of aspiration with Mod A verbal cues for use of swallowing compensatory strategies.   Skilled Therapeutic Interventions:  Pt was seen for skilled ST targeting cognitive goals.  SLP facilitated the session with a structured symbol cancellation task targeting visual scanning to the left of midline.  Pt located 6 out of 10 targets from the right of midline with supervision verbal cues but needed mod-max assist multimodal cues to locate remaining targets at midline or to the left of midline.  Pt was able to identify 3 home activities during which his safety would be impacted as a result of his inattention with min assist verbal cues.   Reviewed and reinforced visual scanning techniques with particular emphasis placed on finger tracking as pt has been stimulable for eliciting brief  periods of scanning past midline with use of strategy.  Pt was left in bed with call bell within reach and bed alarm set.  Continue per current plan of care.       Function:  Eating Eating              Cognition Comprehension Comprehension assist level: Understands basic 90% of the time/cues < 10% of the time  Expression   Expression assist level: Expresses basic 90% of the time/requires cueing < 10% of the time.  Social Interaction Social Interaction assist level: Interacts appropriately 50 - 74% of the time - May be physically or verbally inappropriate.  Problem Solving Problem solving assist level: Solves basic 25 - 49% of the time - needs direction more than half the time to initiate, plan or complete simple activities  Memory Memory assist level: Recognizes or recalls 25 - 49% of the time/requires cueing 50 - 75% of the time    Pain Pain Assessment Pain Assessment: No/denies pain  Therapy/Group: Individual Therapy  James Holden, Melanee SpryNicole L 01/02/2016, 4:03 PM

## 2016-01-02 NOTE — Progress Notes (Signed)
Occupational Therapy Session Note  Patient Details  Name: James Holden MRN: [id number] Date of Birth: 12/02/1960  Today's Date: 01/02/2016 OT Individual Time: 0901-1000 OT Individual Time Calculation (min): 59 min     Short Term Goals: Week 1:  OT Short Term Goal 1 (Week 1): Pt will attend to L side of body to wash L arm with Mod instructional cues OT Short Term Goal 2 (Week 1): Pt will don shirt with max A using hemi techniques OT Short Term Goal 3 (Week 1): Pt will tolerate giv-more-sling on L UE when OOB  Skilled Therapeutic Interventions/Progress Updates:    Pt completed bathing sitting EOB.  Max assist for dynamic sitting balance while engaged in bathing task.  Soft helmet donned initially after sitting up on the EOB.  Max demonstrational cueing as well to scan left of midline for locating and retrieving washcloth for bathing.  Increased pushing to the left side noted when sitting with posterior lean and LOB at times as well.  Pt unable to tolerate crossing of the LLE over the right knee secondary to pain in the left hamstrings when removing socks or washing feet.  Mod assist for squat pivot transfer to the wheelchair from the EOB.  Integrated standing at the sink to pull pants over hips with max assist as well.  Pt still maintains right head turn and gaze.  Will scan to midline with max instructional cueing but does not maintain.  Max assist for sequencing and donning pullover shirt as well.  Pt left in wheelchair at nurses station for safety at end of session with safety belt in place and 1/2 lap tray positioned to support the LUE.   Therapy Documentation Precautions:  Precautions Precautions: Fall Precaution Comments: L hemiplegia, R hemicraniotomy  Required Braces or Orthoses: Other Brace/Splint Other Brace/Splint: Helmet when OOB Restrictions Weight Bearing Restrictions: No  Pain: Pain Assessment Pain Assessment: Faces Pain Score: 4  Faces Pain Scale: Hurts little  more Pain Type: Acute pain Pain Location: Hip Pain Orientation: Left Pain Descriptors / Indicators: Discomfort Pain Onset: With Activity Pain Intervention(s): Repositioned ADL: ADL ADL Comments: Please see functional navigator     See Function Navigator for Current Functional Status.   Therapy/Group: Individual Therapy  Adisen Bennion OTR/L 01/02/2016, 12:40 PM

## 2016-01-02 NOTE — Progress Notes (Addendum)
Physical Therapy Note  Patient Details  Name: Janey GentaJarvis George Blasing MRN: [id number] Date of Birth: 05/13/1960 Today's Date: 01/02/2016  1100-1200, 60 min individiual tx Pain: none reported  Session focused on wt shifting forward in sitting with control, midline orientation to control L drift/LOB left during therapeutic activity requiring matching items via L visual attention.  prolonged stretch L hamstrings in supine; 10 x 1 seated trunk extension with bil shoulder adduction coordinated with deep breathing, in sitting.  .sqaut pivot to R w/c to mat. . Sit> stand in parallel bars, pulling up on bar.  Pt tolerated standing with wt shifting x 1 minutes, x 10 seconds. slide board to R w/c > bed.  Max cues throughout session for L attention.   Pt left resting in bed with all needs in place, bed alarm set and Kelle DartingAbe, NT notified pt was in bed.Marland Kitchen.  Marshell Rieger 01/02/2016, 11:39 AM

## 2016-01-02 NOTE — Progress Notes (Signed)
Nutrition Follow-up  DOCUMENTATION CODES:   Not applicable  INTERVENTION:  Provide nourishment snacks in between meals (pudding, magic cup).  Provide free water flushes of 50 ml BID via PEG for tube maintenance care.  Encourage adequate PO intake.   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 Pt admitted to rehab with right MCA infarct with craniotomy.   Meal completion has been mostly 75-100%. Pt was unavailable during attempted time of visit. Continue nourishment snacks between meals. RD to order free water flushes via PEG for tube maintenance care.   Diet Order:  DIET DYS 2 Room service appropriate? Yes with Assist; Fluid consistency: Nectar Thick  Skin:   (Incision on head)  Last BM:  11/15  Height:   Ht Readings from Last 1 Encounters:  12/27/15 5' 10"  (1.778 m)    Weight:   Wt Readings from Last 1 Encounters:  01/02/16 204 lb 2.3 oz (92.6 kg)    Ideal Body Weight:  75.5 kg  BMI:  Body mass index is 29.29 kg/m.  Estimated Nutritional Needs:   Kcal:  2100-2300  Protein:  100-115 grams  Fluid:  2.1-2.3 L  EDUCATION NEEDS:   Education needs addressed  James Parker, MS, RD, LDN Pager # [phone] After hours/ weekend pager # [phone]

## 2016-01-02 NOTE — Progress Notes (Signed)
Speech Language Pathology Daily Session Note  Patient Details  Name: James Holden MRN: [id number] Date of Birth: 12/20/1960  Today's Date: 01/02/2016 SLP Individual Time: 0800-0900 SLP Individual Time Calculation (min): 60 min   Short Term Goals:Week 1: SLP Short Term Goal 1 (Week 1): Patient will maintain eye contact at midline to conversation partner during functional conversation/task for ~30 seconds in 25% of opportunities  SLP Short Term Goal 2 (Week 1): Patient will perform visual scanning tasks to locate items at midline with Max A multimodal cues in 25% of opportunities.  SLP Short Term Goal 3 (Week 1): Patient will identify 2 cognitive deficits with Max A multimodal cues.  SLP Short Term Goal 4 (Week 1): Patient will demonstrate sustained attention to a functional task for ~5 minutes with Mod A verbal cues for redirection.  SLP Short Term Goal 5 (Week 1): Patient will consume trials of thin liquids without overt s/s of aspiration with Min A verbal cues for use of small sips over 2 sessions to assess readiness for repeat MBS.  SLP Short Term Goal 6 (Week 1): Patient will consume current diet without overt s/s of aspiration with Mod A verbal cues for use of swallowing compensatory strategies.   Skilled Therapeutic Interventions: Pt was seen for skilled ST targeting cognitive and dysphagia goals.  SLP facilitated the session with trials of thin liquids during breakfast meal to reinforce use of swallowing precautions which were proven to be effective for maximizing airway protection during yesterday's MBS.  Pt needed min assist verbal cues for rate and portion control with sips of thin liquids and was able to clear his throat and complete extra swallows after taking bigger bites and sips.  No overt s/s of aspiration with solids or liquids.  Recommend 1-2 more days of trials of thin liquids with SLP only in addition to sips of water in between meals per the water protocol with staff  supervision to ensure consistent use of swallowing precautions prior to advancement.  Therapist also facilitated the session with a basic card game targeting visual scanning to the left of midline.  Pt needed mod-max assist verbal cues for visual scanning techniques to locate and identify cards to the left of midline.  Pt was left in bed with bed alarm set and call bell within reach.  Continue per current plan of care.       Function:  Eating Eating   Modified Consistency Diet: Yes Eating Assist Level: Set up assist for;Supervision or verbal cues;Help with picking up utensils   Eating Set Up Assist For: Opening containers       Cognition Comprehension Comprehension assist level: Understands basic 90% of the time/cues < 10% of the time  Expression   Expression assist level: Expresses basic 90% of the time/requires cueing < 10% of the time.  Social Interaction Social Interaction assist level: Interacts appropriately 50 - 74% of the time - May be physically or verbally inappropriate.  Problem Solving Problem solving assist level: Solves basic 25 - 49% of the time - needs direction more than half the time to initiate, plan or complete simple activities  Memory Memory assist level: Recognizes or recalls 25 - 49% of the time/requires cueing 50 - 75% of the time    Pain Pain Assessment Pain Assessment: 0-10 Pain Score: 8  Pain Location: Leg Pain Orientation: Right Pain Descriptors / Indicators: Aching Pain Intervention(s): Repositioned;RN made aware  Therapy/Group: Individual Therapy  Solomon Skowronek, Joni ReiningNicole L 01/02/2016, 9:00 AM

## 2016-01-02 NOTE — Progress Notes (Signed)
Subjective/Complaints: No issues overnite, discussed swallowing issues Pt concerned about attn ROS  - CP/SOB, N/V/D  Objective: Vital Signs: Blood pressure 96/75, pulse (!) 40, temperature 98.7 F (37.1 C), temperature source Oral, resp. rate 17, height _0  (1.778 m), weight 91.6 kg (201 lb 15.1 oz), SpO2 99 %. Dg Swallowing Func-speech Pathology  Result Date: 01/01/2016 Objective Swallowing Evaluation: Type of Study: MBS-Modified Barium Swallow Study Patient Details Name: James Holden MRN: [id number] Date of Birth: 1961-01-30 Today's Date: 01/01/2016 Time: 0915- 5953 HPI: 55 year old right handed Caucasian male who presented to [facility] 11/14/2015 with large right MCA infarct. He received intravenous TPA immediately after his initial noncontrast head CT was negative for hemorrhagic stroke. Postoperative day 1 complicated by aspiration pneumonia requiring intubation, mechanical ventilation as well as findings of cerebral edema and requiring hemicraniotomy on 11/15/2015.  Patient with recurrent aspiration pneumonia and several extubations and subsequent reintubation with tracheostomy performed 12/03/2015 by ENT and gastrostomy tube placed for nutritional support 12/05/2015. He was weaned  to trach collar on 12/09/2015. Admitted to [place] [facility] 12/11/2015. He was decannulated 12/25/2015.  Pt admitted to CIR and orders written for repeat MBS today to determine readiness for advancement given improvements noted at bedside.   No Data Recorded Assessment / Plan / Recommendation CHL IP CLINICAL IMPRESSIONS 01/01/2016 Therapy Diagnosis Mild pharyngeal phase dysphagia;Mild oral phase dysphagia Clinical Impression Pt presents with a mild oropharyngeal dysphagia with both sensory and motor deficits which are exacerbated by cognitive impairment and poor safety awareness with PO intake.  Pt presents with poor bolus cohesion orally due to impaired lingual manipulation and  decreased base of tongue strength which leads to premature spillage of materials into the oropharynx.  Swallow response was most consistly triggered at the level of the pyriforms due to pt's impulsivity with rate and portion control, although with mod verbal cues to take small controlled sips pt was able to trigger response at the level of the vallecula which significantly improved his airway protection with thin liquids.  No aspiration or penetration was visualized with nectar thick liquids despite variable timing of swallow initiation as mentioned above.  Pt did demonstrate deep penetration to the level of the vocal cords with large sips of thin liquids.  Pt was able to clear materials from the cords with cues for throat clear followed by extra swallows.  While pt did not appear to aspirate on today's evaluation, the likelihood of him aspirating in the setting of a functional meal with larger amounts of PO and increased environmental distractions is high.  This in combination with his recent history of recurrent aspiration pneumonia and limited mobility warrants slower and more conservative diet progression.  Pt's prognosis for advancement to thin liquids is good given his stimulability for use of simple swallowing precautions which proved to be effective for maximizing airway protection; however, due to pt's significant cognitive impairment recommend staying on nectar thick liquids for now with implementation of the water protocol to establish consistent use of strategies.   Impact on safety and function Moderate aspiration risk   No flowsheet data found.  Prognosis 01/01/2016 Prognosis for Safe Diet Advancement Good Barriers to Reach Goals Cognitive deficits Barriers/Prognosis Comment -- CHL IP DIET RECOMMENDATION 01/01/2016 SLP Diet Recommendations Dysphagia 2 (Fine chop) solids;Nectar thick liquid;Other (Comment) Liquid Administration via Cup Medication Administration Whole meds with puree Compensations  Minimize environmental distractions;Slow rate;Small sips/bites;Monitor for anterior loss;Clear throat intermittently Postural Changes Seated upright at 90 degrees  CHL IP ORAL PHASE 01/01/2016 Oral Phase Impaired Oral - Pudding Teaspoon -- Oral - Pudding Cup -- Oral - Honey Teaspoon -- Oral - Honey Cup -- Oral - Nectar Teaspoon -- Oral - Nectar Cup Delayed oral transit;Decreased bolus cohesion;Premature spillage;Weak lingual manipulation Oral - Nectar Straw -- Oral - Thin Teaspoon -- Oral - Thin Cup Left anterior bolus loss;Delayed oral transit;Decreased bolus cohesion;Premature spillage;Weak lingual manipulation Oral - Thin Straw -- Oral - Puree Delayed oral transit;Weak lingual manipulation;Premature spillage Oral - Mech Soft -- Oral - Regular Weak lingual manipulation;Delayed oral transit;Premature spillage Oral - Multi-Consistency -- Oral - Pill -- Oral Phase - Comment --  CHL IP PHARYNGEAL PHASE 01/01/2016 Pharyngeal Phase Impaired Pharyngeal- Pudding Teaspoon -- Pharyngeal -- Pharyngeal- Pudding Cup -- Pharyngeal -- Pharyngeal- Honey Teaspoon -- Pharyngeal -- Pharyngeal- Honey Cup -- Pharyngeal -- Pharyngeal- Nectar Teaspoon -- Pharyngeal -- Pharyngeal- Nectar Cup Delayed swallow initiation-pyriform sinuses;Reduced tongue base retraction Pharyngeal -- Pharyngeal- Nectar Straw -- Pharyngeal -- Pharyngeal- Thin Teaspoon -- Pharyngeal -- Pharyngeal- Thin Cup Delayed swallow initiation-pyriform sinuses;Reduced airway/laryngeal closure;Reduced tongue base retraction;Penetration/Aspiration during swallow Pharyngeal Material enters airway, CONTACTS cords and then ejected out Pharyngeal- Thin Straw -- Pharyngeal -- Pharyngeal- Puree Delayed swallow initiation-vallecula Pharyngeal -- Pharyngeal- Mechanical Soft -- Pharyngeal -- Pharyngeal- Regular Delayed swallow initiation-vallecula;Delayed swallow initiation-pyriform sinuses Pharyngeal -- Pharyngeal- Multi-consistency -- Pharyngeal -- Pharyngeal- Pill --  Pharyngeal -- Pharyngeal Comment --  Page, Selinda Orion 01/01/2016, 7:16 PM              Results for orders placed or performed during the hospital encounter of 12/27/15 (from the past 72 hour(s))  CBC WITH DIFFERENTIAL     Status: Abnormal   Collection Time: 12/30/15 10:02 AM  Result Value Ref Range   WBC 6.8 4.0 - 10.5 K/uL   RBC 4.15 (L) 4.22 - 5.81 MIL/uL   Hemoglobin 11.4 (L) 13.0 - 17.0 g/dL   HCT 36.0 (L) 39.0 - 52.0 %   MCV 86.7 78.0 - 100.0 fL   MCH 27.5 26.0 - 34.0 pg   MCHC 31.7 30.0 - 36.0 g/dL   RDW 15.6 (H) 11.5 - 15.5 %   Platelets 308 150 - 400 K/uL   Neutrophils Relative % 59 %   Neutro Abs 4.0 1.7 - 7.7 K/uL   Lymphocytes Relative 32 %   Lymphs Abs 2.2 0.7 - 4.0 K/uL   Monocytes Relative 6 %   Monocytes Absolute 0.4 0.1 - 1.0 K/uL   Eosinophils Relative 3 %   Eosinophils Absolute 0.2 0.0 - 0.7 K/uL   Basophils Relative 0 %   Basophils Absolute 0.0 0.0 - 0.1 K/uL  Comprehensive metabolic panel     Status: Abnormal   Collection Time: 12/30/15 10:02 AM  Result Value Ref Range   Sodium 131 (L) 135 - 145 mmol/L   Potassium 4.8 3.5 - 5.1 mmol/L   Chloride 95 (L) 101 - 111 mmol/L   CO2 25 22 - 32 mmol/L   Glucose, Bld 244 (H) 65 - 99 mg/dL   BUN 20 6 - 20 mg/dL   Creatinine, Ser 1.01 0.61 - 1.24 mg/dL   Calcium 9.4 8.9 - 10.3 mg/dL   Total Protein 7.8 6.5 - 8.1 g/dL   Albumin 3.2 (L) 3.5 - 5.0 g/dL   AST 38 15 - 41 U/L   ALT 40 17 - 63 U/L   Alkaline Phosphatase 92 38 - 126 U/L   Total Bilirubin 0.6 0.3 - 1.2 mg/dL   GFR calc non  Af Amer >60 >60 mL/min   GFR calc Af Amer >60 >60 mL/min    Comment: (NOTE) The eGFR has been calculated using the CKD EPI equation. This calculation has not been validated in all clinical situations. eGFR's persistently <60 mL/min signify possible Chronic Kidney Disease.    Anion gap 11 5 - 15  Glucose, capillary     Status: Abnormal   Collection Time: 12/30/15 11:54 AM  Result Value Ref Range   Glucose-Capillary 183 (H) 65 - 99  mg/dL  Glucose, capillary     Status: Abnormal   Collection Time: 12/30/15  4:31 PM  Result Value Ref Range   Glucose-Capillary 125 (H) 65 - 99 mg/dL  Glucose, capillary     Status: Abnormal   Collection Time: 12/30/15  9:33 PM  Result Value Ref Range   Glucose-Capillary 144 (H) 65 - 99 mg/dL  Glucose, capillary     Status: Abnormal   Collection Time: 12/31/15  6:32 AM  Result Value Ref Range   Glucose-Capillary 174 (H) 65 - 99 mg/dL  Glucose, capillary     Status: Abnormal   Collection Time: 12/31/15 11:36 AM  Result Value Ref Range   Glucose-Capillary 172 (H) 65 - 99 mg/dL  Glucose, capillary     Status: Abnormal   Collection Time: 12/31/15  4:19 PM  Result Value Ref Range   Glucose-Capillary 145 (H) 65 - 99 mg/dL  Glucose, capillary     Status: Abnormal   Collection Time: 12/31/15  8:43 PM  Result Value Ref Range   Glucose-Capillary 158 (H) 65 - 99 mg/dL  Glucose, capillary     Status: Abnormal   Collection Time: 01/01/16  6:55 AM  Result Value Ref Range   Glucose-Capillary 148 (H) 65 - 99 mg/dL  Glucose, capillary     Status: Abnormal   Collection Time: 01/01/16 12:01 PM  Result Value Ref Range   Glucose-Capillary 154 (H) 65 - 99 mg/dL  Glucose, capillary     Status: Abnormal   Collection Time: 01/01/16  4:57 PM  Result Value Ref Range   Glucose-Capillary 148 (H) 65 - 99 mg/dL  Glucose, capillary     Status: Abnormal   Collection Time: 01/01/16  8:38 PM  Result Value Ref Range   Glucose-Capillary 162 (H) 65 - 99 mg/dL   Comment 1 Notify RN   Glucose, capillary     Status: Abnormal   Collection Time: 01/02/16  6:56 AM  Result Value Ref Range   Glucose-Capillary 174 (H) 65 - 99 mg/dL   Comment 1 Notify RN       General: No acute distress ENT- former trach site CDI, tongue is moist, difficult to visualize pharynx, no cervical adenopathy Mood and affect are appropriate Heart: Regular rate no rubs murmurs , + ectopy Lungs: Clear to auscultation, breathing  unlabored, no rales or wheezes Abdomen: Positive bowel sounds, soft nontender to palpation, nondistended, PEG site CDI Extremities: No clubbing, cyanosis, or edema Skin: No evidence of breakdown, no evidence of rash Neurologic: Cranial nerves II through XII intact, motor strength is 5/5 in RIght  deltoid, bicep, tricep, grip, hip flexor, knee extensors, ankle dorsiflexor and plantar flexor, 0/5 on left side except 2-/5 Left hip knee synergy Sensory exam normal sensation to light touch and proprioception in right upper and lower extremities  Musculoskeletal: Full range of motion in all 4 extremities. No joint swelling   Assessment/Plan: 1. Functional deficits secondary to RIght MCA infarct with left hemiplegia which require 3+ hours  per day of interdisciplinary therapy in a comprehensive inpatient rehab setting. Physiatrist is providing close team supervision and 24 hour management of active medical problems listed below. Physiatrist and rehab team continue to assess barriers to discharge/monitor patient progress toward functional and medical goals. FIM: Function - Bathing Position: Other (comment) (Lb from bed level and Ub at sink) Body parts bathed by patient: Chest, Abdomen, Left arm, Front perineal area, Buttocks, Right upper leg, Left upper leg Body parts bathed by helper: Right arm, Right lower leg, Left lower leg, Back Bathing not applicable: Right lower leg Assist Level:  (mod A)  Function- Upper Body Dressing/Undressing What is the patient wearing?: Pull over shirt/dress Pull over shirt/dress - Perfomed by helper: Thread/unthread right sleeve, Thread/unthread left sleeve, Put head through opening, Pull shirt over trunk Assist Level:  (total a) Function - Lower Body Dressing/Undressing What is the patient wearing?: Pants, Non-skid slipper socks Position: Bed Pants- Performed by helper: Thread/unthread right pants leg, Thread/unthread left pants leg, Pull pants up/down Non-skid  slipper socks- Performed by helper: Don/doff right sock, Don/doff left sock Socks - Performed by helper: Don/doff left sock, Don/doff right sock Assist for footwear: Dependant Assist for lower body dressing:  (total)  Function - Toileting Toileting steps completed by helper: Adjust clothing prior to toileting, Performs perineal hygiene, Adjust clothing after toileting Assist level: Two helpers  Function - Air cabin crew transfer assistive device: Mechanical lift Assist level to toilet: 2 helpers Assist level from toilet: 2 helpers  Function - Chair/bed transfer Chair/bed transfer method: Lateral scoot Chair/bed transfer assist level: Maximal assist (Pt 25 - 49%/lift and lower) Chair/bed transfer assistive device: Armrests, Sliding board Mechanical lift: Clarise Cruz Chair/bed transfer details: Manual facilitation for placement, Verbal cues for safe use of DME/AE, Manual facilitation for weight shifting, Verbal cues for technique  Function - Locomotion: Wheelchair Will patient use wheelchair at discharge?: Yes Type: Manual Max wheelchair distance: 31f Assist Level: Maximal assistance (Pt 25 - 49%) Assist Level: Maximal assistance (Pt 25 - 49%) Wheel 150 feet activity did not occur: Safety/medical concerns Turns around,maneuvers to table,bed, and toilet,negotiates 3% grade,maneuvers on rugs and over doorsills: No Function - Locomotion: Ambulation Ambulation activity did not occur: Safety/medical concerns Walk 10 feet activity did not occur: Safety/medical concerns Walk 50 feet with 2 turns activity did not occur: Safety/medical concerns Walk 150 feet activity did not occur: Safety/medical concerns Walk 10 feet on uneven surfaces activity did not occur: Safety/medical concerns  Function - Comprehension Comprehension: Auditory Comprehension assist level: Understands basic 90% of the time/cues < 10% of the time  Function - Expression Expression: Verbal Expression assist level:  Expresses basic 90% of the time/requires cueing < 10% of the time.  Function - Social Interaction Social Interaction assist level: Interacts appropriately 25 - 49% of time - Needs frequent redirection.  Function - Problem Solving Problem solving assist level: Solves basic 25 - 49% of the time - needs direction more than half the time to initiate, plan or complete simple activities  Function - Memory Memory assist level: Recognizes or recalls 25 - 49% of the time/requires cueing 50 - 75% of the time Patient normally able to recall (first 3 days only): Current season, That he or she is in a hospital  Medical Problem List and Plan: 1.  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 Helmet when out of bed.             -CIR, PT, OT SLP, . 2.  DVT Prophylaxis/Anticoagulation: Subcutaneous heparin for DVT prophylaxis 3. Pain Management/chronic back pain: Oxycodone 5 mg every 6 hours as needed 4. Mood: Prozac 40 mg daily.  Abilify 5 mg daily 5. Neuropsych: This patient is capable of making decisions on his own behalf.Poor attn trial ritalin 6. Skin/Wound Care: Routine skin checks 7. Fluids/Electrolytes/Nutrition: Routine I&O with follow-up chemistries upon admit. 8.H/O  Tracheostomy Decannulated 12/25/2015- no resp distress, stoma healed 9. Gastrostomy PEG tube 12/05/2015. Nocturnal tube feeds recently discontinued. Currently on dysphagia #2 nectar liquid. Good intake this am per RN, No upgrade after repeat MBS             -regular PEG maintenance, may d/c after 01/19/2016 10. Hypertension. Hydralazine 10 mg every 8 hours, amlodipine 5 mg daily, lisinopril 30 mg daily. Monitor with increased mobility in therapy 11. Diabetes mellitus. Lantus insulin 25 units daily at bedtime.improving   Check blood sugars before meals and at bedtime,             - CBG (last 3)   Recent Labs  01/01/16 1657 01/01/16 2038 01/02/16 0656  GLUCAP 148* 162* 174*     12.  Hyperlipidemia. Lipitor 80 mg daily at bedtime 13.Tobacco abuse. Counseling as appropriate 14.  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  Sore throat , resolved LOS (Days) 6 A FACE TO FACE EVALUATION WAS PERFORMED  Kathrin Folden E 01/02/2016, 7:41 AM

## 2016-01-03 ENCOUNTER — Inpatient Hospital Stay (HOSPITAL_COMMUNITY): Payer: BLUE CROSS/BLUE SHIELD | Admitting: Speech Pathology

## 2016-01-03 ENCOUNTER — Inpatient Hospital Stay (HOSPITAL_COMMUNITY): Payer: BLUE CROSS/BLUE SHIELD | Admitting: Occupational Therapy

## 2016-01-03 ENCOUNTER — Inpatient Hospital Stay (HOSPITAL_COMMUNITY): Payer: BLUE CROSS/BLUE SHIELD | Admitting: Physical Therapy

## 2016-01-03 DIAGNOSIS — R269 Unspecified abnormalities of gait and mobility: Secondary | ICD-10-CM

## 2016-01-03 DIAGNOSIS — I69398 Other sequelae of cerebral infarction: Secondary | ICD-10-CM

## 2016-01-03 LAB — GLUCOSE, CAPILLARY
GLUCOSE-CAPILLARY: 153 mg/dL — AB (ref 65–99)
Glucose-Capillary: 152 mg/dL — ABNORMAL HIGH (ref 65–99)
Glucose-Capillary: 165 mg/dL — ABNORMAL HIGH (ref 65–99)
Glucose-Capillary: 151 mg/dL — ABNORMAL HIGH (ref 65–99)

## 2016-01-03 MED ORDER — METHYLPHENIDATE HCL 5 MG PO TABS
5.0000 mg | ORAL_TABLET | Freq: Two times a day (BID) | ORAL | Status: DC
  Administered 2016-01-03 – 2016-01-05 (×6): 5 mg via ORAL
  Filled 2016-01-03 (×6): qty 1

## 2016-01-03 NOTE — Progress Notes (Signed)
Occupational Therapy Session Note  Patient Details  Name: James Holden MRN: [id number] Date of Birth: 02/08/1961  Today's Date: 01/03/2016 OT Individual Time: [id number] OT Individual Time Calculation (min): 59 min     Short Term Goals: Week 1:  OT Short Term Goal 1 (Week 1): Pt will attend to L side of body to wash L arm with Mod instructional cues OT Short Term Goal 2 (Week 1): Pt will don shirt with max A using hemi techniques OT Short Term Goal 3 (Week 1): Pt will tolerate giv-more-sling on L UE when OOB  Skilled Therapeutic Interventions/Progress Updates:    Pt transferred supine to sit EOB with max assist and step by step cueing to sequence.  Once sitting scooting to EOB completed with mod assist reciprically.  He was able to sit statically on EOB with min assist in preparation for transfer to the wheelchair.  Max assist squat pivot to the right to wheelchair.  Worked on shaving and trimming beard at the sink.  Pt able to complete 10% of task but needed overall max assist for safety and thoroughness.  Worked on sitting unsupported at times in wheelchair with min assist.  Only being able to maintain sitting up without UE or back support for a few seconds to a minute before attempting to stabilize balance using the RUE up on the sink.  Incorporated visual scanning to the left of midline to locate washcloth or towel when wiping off his face with max instructional cueing.   Pt very thankful to therapist for helping with shaving.  Donned new shirt with mod assist and max demonstrational cueing after completion.  Pt requested to return to bed at end of session.  Total assist for stand pivot transfer to the bed to conclude session.  Bed alarm in place and call button in reach to conclude session.   Therapy Documentation Precautions:  Precautions Precautions: Fall Precaution Comments: L hemiplegia, R hemicraniotomy  Required Braces or Orthoses: Other Brace/Splint Other Brace/Splint: Helmet  when OOB Restrictions Weight Bearing Restrictions: No  Pain: Pain Assessment Pain Assessment: Faces Faces Pain Scale: Hurts a little bit Pain Type: Chronic pain Pain Location: Back Pain Descriptors / Indicators: Discomfort Pain Intervention(s): Repositioned ADL: See Function Navigator for Current Functional Status.   Therapy/Group: Individual Therapy  Tkeya Stencil OTR/L 01/03/2016, 12:55 PM

## 2016-01-03 NOTE — Progress Notes (Signed)
Occupational Therapy Weekly Progress Note  Patient Details  Name: James Holden MRN: [id number] Date of Birth: [birth year]  Beginning of progress report period: December 28, 2015 End of progress report period: January 03, 2016  Today's Date: 01/03/2016 OT Individual Time: 1330-1402 OT Individual Time Calculation (min): 32 min     Patient has met 1 of 3 short term goals.  James Holden continues to make slow gains with OT at this time.  He demonstrates limited endurance for maintaining sitting or standing during sessions, with report of significant fatigue by sessions end or increased back pain.  PT is working on wheelchair seating positions to help him tolerate being out of bed as much as possible at this time.  Usually after 60 min OT session he is wanting to get back in the bed because of being uncomfortable.  Functionally he still needs mod assist for UB selfcare in supported sitting, with max assist needed if completed unsupported secondary to decreased dynamic sitting balance. LUE function is limited to a stabilizer level with max hand over hand assistance.  Slight active internal rotation of the humerus is noted to command but do not note any other functional movements at this time.  James Holden continues to exhibit left neglect with head turn and gaze to the right.  He can scan across midline to locate therapist or other objects but needs max instructional cueing to do so.  When scanning to the left he is unable to maintain it for more than 2-5 seconds.  Transfers to the toilet or to wheelchair are still at a max assist level for squat pivot with total assist for stand pivot.  Currently, feel based on the severity of his CVA and other medical issues that he has suffered from he will continue to need extensive CIR level therapy to reach min assist level for return home with his wife and son also assisting.  Will continue to follow.    Patient continues to demonstrate the following deficits:  decreased balance, decreased endurance, increased pain, left neglect, LUE and LLE hemiparesis, decreased awareness,  and therefore will continue to benefit from skilled OT intervention to enhance overall performance with BADL.  Patient progressing toward long term goals..  Continue plan of care.  OT Short Term Goals Week 2:  OT Short Term Goal 1 (Week 2): Pt will maintain sitting balance min assist unsupported during selfcare tasks.  OT Short Term Goal 2 (Week 2): Pt will complete sit to stand for LB bathing and dressing with no more than mod assist level. OT Short Term Goal 3 (Week 2): Pt will complete UB dressing with min assist and mod demonstrational cueing  OT Short Term Goal 4 (Week 2): Pt will initiate and complete supine to sit on the left side with mod assist in preparation for selfcare tasks.  OT Short Term Goal 5 (Week 2): Pt will visually scan left of midline to locate grooming, bathing, or feeding items with no more than mod instructional cueing.     Skilled Therapeutic Interventions/Progress Updates:    Pt worked in bed during session secondary to increased back pain and anticipation of further PT and SLP sessions right after OT completion.  Worked on rolling from supine to the right side during session.  Therapist educated pt on sequence for rolling including using the RUE to bring the LUE over to his abdomen and then getting assist for flexing the left knee to get his pelvis and hips ready for movement.  Worked  on initiation of the abdominal muscles with slight head flexion for rolling to the right.  Pt needing total assist initially but could complete rolling after positioning of LLE and LUE with mod assist.  Increased pain in the left shoulder and left hip.  Also worked on LUE PROM exercises for the left shoulder and elbow and hand.  Pt needing max instructional cueing to maintain some visual attention to the LUE when attempting to move.  Pt currently just stating "I can't move it"  without effort and without visual attention. Encouraged pt to maintain visual attention and to really focus on doing as much therapy as he can to achieve his highest level of independence within the next couple of weeks.  Pt left in bed with call button in reach and bed alarm in place.   Therapy Documentation Precautions:  Precautions Precautions: Fall Precaution Comments: L hemiplegia, R hemicraniotomy  Required Braces or Orthoses: Other Brace/Splint Other Brace/Splint: Helmet when OOB Restrictions Weight Bearing Restrictions: No  Pain: Pain Assessment Pain Assessment: Faces Faces Pain Scale: Hurts little more Pain Type: Acute pain Pain Location: Back Pain Descriptors / Indicators: Discomfort Pain Intervention(s): Repositioned Multiple Pain Sites: No ADL: See Function Navigator for Current Functional Status.   Therapy/Group: Individual Therapy  Sativa Gelles OTR/L 01/03/2016, 3:50 PM

## 2016-01-03 NOTE — Progress Notes (Signed)
Speech Language Pathology Weekly Progress and Session Note  Patient Details  Name: James Holden MRN: [id number] Date of Birth: 06/09/1960  Beginning of progress report period: December 27, 2015 End of progress report period: January 03, 2016  Today's Date: 01/03/2016 SLP Individual Time: 1500-1530 SLP Individual Time Calculation (min): 30 min   Short Term Goals: Week 1: SLP Short Term Goal 1 (Week 1): Patient will maintain eye contact at midline to conversation partner during functional conversation/task for ~30 seconds in 25% of opportunities  SLP Short Term Goal 1 - Progress (Week 1): Progressing toward goal SLP Short Term Goal 2 (Week 1): Patient will perform visual scanning tasks to locate items at midline with Max A multimodal cues in 25% of opportunities.  SLP Short Term Goal 2 - Progress (Week 1): Met SLP Short Term Goal 3 (Week 1): Patient will identify 2 cognitive deficits with Max A multimodal cues.  SLP Short Term Goal 3 - Progress (Week 1): Met SLP Short Term Goal 4 (Week 1): Patient will demonstrate sustained attention to a functional task for ~5 minutes with Mod A verbal cues for redirection.  SLP Short Term Goal 4 - Progress (Week 1): Met SLP Short Term Goal 5 (Week 1): Patient will consume trials of thin liquids without overt s/s of aspiration with Min A verbal cues for use of small sips over 2 sessions to assess readiness for repeat MBS.  SLP Short Term Goal 5 - Progress (Week 1): Progressing toward goal SLP Short Term Goal 6 (Week 1): Patient will consume current diet without overt s/s of aspiration with Mod A verbal cues for use of swallowing compensatory strategies.  SLP Short Term Goal 6 - Progress (Week 1): Met    New Short Term Goals: Week 2: SLP Short Term Goal 1 (Week 2): Patient will consume trials of thin liquids without overt s/s of aspiration with Min A verbal cues for use of small sips over 2 sessions to assess readiness for repeat MBS.  SLP Short  Term Goal 2 (Week 2): Patient will consume current diet without overt s/s of aspiration with min A verbal cues for use of swallowing compensatory strategies.  SLP Short Term Goal 3 (Week 2): Patient will maintain eye contact at midline to conversation partner during functional conversation/task for ~30 seconds in 25% of opportunities  SLP Short Term Goal 4 (Week 2): Patient will perform visual scanning tasks to locate items at midline with mod A multimodal cues in 50% of opportunities.  SLP Short Term Goal 5 (Week 2): Pt will demonstrate basic problem solving in functional tasks with mod A.  Weekly Progress Updates: Pt has met 4 of 6 short term goals and is making excellent gains toward increasing awareness and need for compliance with compensatory strategies to increase function.   Intensity: Minumum of 1-2 x/day, 30 to 90 minutes Frequency: 3 to 5 out of 7 days Duration/Length of Stay: 4 weeks  Treatment/Interventions: Cognitive remediation/compensation;Cueing hierarchy;Functional tasks;Patient/family education;Therapeutic Activities;Internal/external aids;Dysphagia/aspiration precaution training;Environmental controls   Daily Session  Skilled Therapeutic Interventions: Pt tolerated sips of thin via cup sip without s/s aspiration and mod verbal cueing to slow rate and bolus size. Pt with 100% recall of precautions independently, but mod A to implement. Pt able to track to mid-line with max cueing in 70% of trials. Eye contact maintained for less than 5 seconds.      Function:   Eating Eating   Modified Consistency Diet: Yes Eating Assist Level: Set up assist for;Supervision  or verbal cues;Help with picking up utensils   Eating Set Up Assist For: Opening containers       Cognition Comprehension Comprehension assist level: Understands basic 90% of the time/cues < 10% of the time  Expression   Expression assist level: Expresses basic 90% of the time/requires cueing < 10% of the time.   Social Interaction Social Interaction assist level: Interacts appropriately 75 - 89% of the time - Needs redirection for appropriate language or to initiate interaction.  Problem Solving Problem solving assist level: Solves basic less than 25% of the time - needs direction nearly all the time or does not effectively solve problems and may need a restraint for safety  Memory Memory assist level: Recognizes or recalls 50 - 74% of the time/requires cueing 25 - 49% of the time   General    Pain Pain Assessment Pain Assessment: No/denies pain Faces Pain Scale: Hurts little more Pain Type: Acute pain Pain Location: Back Pain Descriptors / Indicators: Discomfort Pain Intervention(s): Repositioned Multiple Pain Sites: No  Therapy/Group: Individual Therapy  Vinetta Bergamo MA, CCC-SLP 01/03/2016, 4:14 PM

## 2016-01-03 NOTE — Progress Notes (Signed)
Subjective/Complaints: No issues overnite, discussed swallowing issues Pt concerned about attn ROS  - CP/SOB, N/V/D  Objective: Vital Signs: Blood pressure 138/65, pulse 82, temperature 98.3 F (36.8 C), temperature source Oral, resp. rate 18, height 5\' 10"  (1.778 m), weight 92.6 kg (204 lb 2.3 oz), SpO2 100 %. Dg Swallowing Func-speech Pathology  Result Date: 01/01/2016 Objective Swallowing Evaluation: Type of Study: MBS-Modified Barium Swallow Study Patient Details Name: James Holden MRN: [id number] Date of Birth: 01/26/1961 Today's Date: 01/01/2016 Time: [id number]- [id number] HPI: 55 year old right handed Caucasian male [facility] with large right MCA infarct. He received intravenous TPA immediately after his initial noncontrast head CT was negative for hemorrhagic stroke. Postoperative day 1 complicated by aspiration pneumonia requiring intubation, mechanical ventilation as well as findings of cerebral edema and requiring hemicraniotomy on 11/15/2015.  Patient with recurrent aspiration pneumonia and several extubations and subsequent reintubation with tracheostomy performed 12/03/2015 by ENT and gastrostomy tube placed for nutritional support 12/05/2015. He was weaned  to trach collar on 12/09/2015. Ad[facility]11/2015. He was decannulated 12/25/2015.  Pt admitted to CIR and orders written for repeat MBS today to determine readiness for advancement given improvements noted at bedside.   No Data Recorded Assessment / Plan / Recommendation CHL IP CLINICAL IMPRESSIONS 01/01/2016 Therapy Diagnosis Mild pharyngeal phase dysphagia;Mild oral phase dysphagia Clinical Impression Pt presents with a mild oropharyngeal dysphagia with both sensory and motor deficits which are exacerbated by cognitive impairment and poor safety awareness with PO intake.  Pt presents with poor bolus cohesion orally due to impaired lingual manipulation and decreased  base of tongue strength which leads to premature spillage of materials into the oropharynx.  Swallow response was most consistly triggered at the level of the pyriforms due to pt's impulsivity with rate and portion control, although with mod verbal cues to take small controlled sips pt was able to trigger response at the level of the vallecula which significantly improved his airway protection with thin liquids.  No aspiration or penetration was visualized with nectar thick liquids despite variable timing of swallow initiation as mentioned above.  Pt did demonstrate deep penetration to the level of the vocal cords with large sips of thin liquids.  Pt was able to clear materials from the cords with cues for throat clear followed by extra swallows.  While pt did not appear to aspirate on today's evaluation, the likelihood of him aspirating in the setting of a functional meal with larger amounts of PO and increased environmental distractions is high.  This in combination with his recent history of recurrent aspiration pneumonia and limited mobility warrants slower and more conservative diet progression.  Pt's prognosis for advancement to thin liquids is good given his stimulability for use of simple swallowing precautions which proved to be effective for maximizing airway protection; however, due to pt's significant cognitive impairment recommend staying on nectar thick liquids for now with implementation of the water protocol to establish consistent use of strategies.   Impact on safety and function Moderate aspiration risk   No flowsheet data found.  Prognosis 01/01/2016 Prognosis for Safe Diet Advancement Good Barriers to Reach Goals Cognitive deficits Barriers/Prognosis Comment -- CHL IP DIET RECOMMENDATION 01/01/2016 SLP Diet Recommendations Dysphagia 2 (Fine chop) solids;Nectar thick liquid;Other (Comment) Liquid Administration via Cup Medication Administration Whole meds with puree Compensations Minimize  environmental distractions;Slow rate;Small sips/bites;Monitor for anterior loss;Clear throat intermittently Postural Changes Seated upright at 90 degrees  CHL IP ORAL PHASE 01/01/2016 Oral Phase Impaired Oral - Pudding Teaspoon -- Oral - Pudding Cup -- Oral - Honey Teaspoon -- Oral - Honey Cup -- Oral - Nectar Teaspoon -- Oral - Nectar Cup Delayed oral transit;Decreased bolus cohesion;Premature spillage;Weak lingual manipulation Oral - Nectar Straw -- Oral - Thin Teaspoon -- Oral - Thin Cup Left anterior bolus loss;Delayed oral transit;Decreased bolus cohesion;Premature spillage;Weak lingual manipulation Oral - Thin Straw -- Oral - Puree Delayed oral transit;Weak lingual manipulation;Premature spillage Oral - Mech Soft -- Oral - Regular Weak lingual manipulation;Delayed oral transit;Premature spillage Oral - Multi-Consistency -- Oral - Pill -- Oral Phase - Comment --  CHL IP PHARYNGEAL PHASE 01/01/2016 Pharyngeal Phase Impaired Pharyngeal- Pudding Teaspoon -- Pharyngeal -- Pharyngeal- Pudding Cup -- Pharyngeal -- Pharyngeal- Honey Teaspoon -- Pharyngeal -- Pharyngeal- Honey Cup -- Pharyngeal -- Pharyngeal- Nectar Teaspoon -- Pharyngeal -- Pharyngeal- Nectar Cup Delayed swallow initiation-pyriform sinuses;Reduced tongue base retraction Pharyngeal -- Pharyngeal- Nectar Straw -- Pharyngeal -- Pharyngeal- Thin Teaspoon -- Pharyngeal -- Pharyngeal- Thin Cup Delayed swallow initiation-pyriform sinuses;Reduced airway/laryngeal closure;Reduced tongue base retraction;Penetration/Aspiration during swallow Pharyngeal Material enters airway, CONTACTS cords and then ejected out Pharyngeal- Thin Straw -- Pharyngeal -- Pharyngeal- Puree Delayed swallow initiation-vallecula Pharyngeal -- Pharyngeal- Mechanical Soft -- Pharyngeal -- Pharyngeal- Regular Delayed swallow initiation-vallecula;Delayed swallow initiation-pyriform sinuses Pharyngeal -- Pharyngeal- Multi-consistency -- Pharyngeal -- Pharyngeal- Pill -- Pharyngeal  -- Pharyngeal Comment --  Page, Melanee SpryNicole L 01/01/2016, 7:16 PM              Results for orders placed or performed during the hospital encounter of 12/27/15 (from the past 72 hour(s))  Glucose, capillary     Status: Abnormal   Collection Time: 12/31/15 11:36 AM  Result Value Ref Range   Glucose-Capillary 172 (H) 65 - 99 mg/dL  Glucose, capillary     Status: Abnormal   Collection Time: 12/31/15  4:19 PM  Result Value Ref Range   Glucose-Capillary 145 (H) 65 - 99 mg/dL  Glucose, capillary     Status: Abnormal   Collection Time: 12/31/15  8:43 PM  Result Value Ref Range   Glucose-Capillary 158 (H) 65 - 99 mg/dL  Glucose, capillary     Status: Abnormal   Collection Time: 01/01/16  6:55 AM  Result Value Ref Range   Glucose-Capillary 148 (H) 65 - 99 mg/dL  Glucose, capillary     Status: Abnormal   Collection Time: 01/01/16 12:01 PM  Result Value Ref Range   Glucose-Capillary 154 (H) 65 - 99 mg/dL  Glucose, capillary     Status: Abnormal   Collection Time: 01/01/16  4:57 PM  Result Value Ref Range   Glucose-Capillary 148 (H) 65 - 99 mg/dL  Glucose, capillary     Status: Abnormal   Collection Time: 01/01/16  8:38 PM  Result Value Ref Range   Glucose-Capillary 162 (H) 65 - 99 mg/dL   Comment 1 Notify RN   Glucose, capillary     Status: Abnormal   Collection Time: 01/02/16  6:56 AM  Result Value Ref Range   Glucose-Capillary 174 (H) 65 - 99 mg/dL   Comment 1 Notify RN   Glucose, capillary     Status: Abnormal   Collection Time: 01/02/16 12:11 PM  Result Value Ref Range   Glucose-Capillary 197 (H) 65 - 99 mg/dL  Glucose, capillary     Status: Abnormal   Collection Time: 01/02/16  5:23 PM  Result Value Ref Range   Glucose-Capillary 150 (H) 65 - 99 mg/dL  Glucose, capillary     Status: Abnormal   Collection Time: 01/02/16  8:55 PM  Result Value Ref Range   Glucose-Capillary 168 (H) 65 - 99 mg/dL  Urinalysis, Routine w reflex microscopic (not at [facility])     Status: None   Collection  Time: 01/02/16 11:01 PM  Result Value Ref Range   Color, Urine YELLOW YELLOW   APPearance CLEAR CLEAR   Specific Gravity, Urine 1.018 1.005 - 1.030   pH 7.5 5.0 - 8.0   Glucose, UA NEGATIVE NEGATIVE mg/dL   Hgb urine dipstick NEGATIVE NEGATIVE   Bilirubin Urine NEGATIVE NEGATIVE   Ketones, ur NEGATIVE NEGATIVE mg/dL   Protein, ur NEGATIVE NEGATIVE mg/dL   Nitrite NEGATIVE NEGATIVE   Leukocytes, UA NEGATIVE NEGATIVE    Comment: MICROSCOPIC NOT DONE ON URINES WITH NEGATIVE PROTEIN, BLOOD, LEUKOCYTES, NITRITE, OR GLUCOSE <1000 mg/dL.  Glucose, capillary     Status: Abnormal   Collection Time: 01/03/16  6:51 AM  Result Value Ref Range   Glucose-Capillary 153 (H) 65 - 99 mg/dL      General: No acute distress ENT- former trach site CDI, tongue is moist, difficult to visualize pharynx, no cervical adenopathy Mood and affect are appropriate Heart: Regular rate no rubs murmurs , + ectopy Lungs: Clear to auscultation, breathing unlabored, no rales or wheezes Abdomen: Positive bowel sounds, soft nontender to palpation, nondistended, PEG site CDI Extremities: No clubbing, cyanosis, or edema Skin: No evidence of breakdown, no evidence of rash Neurologic: Cranial nerves II through XII intact, motor strength is 5/5 in RIght  deltoid, bicep, tricep, grip, hip flexor, knee extensors, ankle dorsiflexor and plantar flexor, 0/5 on left side except 2-/5 Left hip knee synergy Sensory exam normal sensation to light touch and proprioception in right upper and lower extremities  Musculoskeletal: Full range of motion in all 4 extremities. No joint swelling   Assessment/Plan: 1. Functional deficits secondary to RIght MCA infarct with left hemiplegia which require 3+ hours per day of interdisciplinary therapy in a comprehensive inpatient rehab setting. Physiatrist is providing close team supervision and 24 hour management of active medical problems listed below. Physiatrist and rehab team continue to assess  barriers to discharge/monitor patient progress toward functional and medical goals. FIM: Function - Bathing Position: Sitting EOB Body parts bathed by patient: Left arm, Chest, Abdomen, Right upper leg, Left upper leg Body parts bathed by helper: Right arm, Right lower leg, Left lower leg Bathing not applicable: Front perineal area, Buttocks (did not attempt this session) Assist Level:  (mod A)  Function- Upper Body Dressing/Undressing What is the patient wearing?: Pull over shirt/dress Pull over shirt/dress - Perfomed by helper: Thread/unthread right sleeve, Thread/unthread left sleeve, Put head through opening, Pull shirt over trunk Assist Level:  (total a) Function - Lower Body Dressing/Undressing What is the patient wearing?: Pants, Non-skid slipper socks Position: Bed Pants- Performed by helper: Thread/unthread right pants leg, Thread/unthread left pants leg, Pull pants up/down Non-skid slipper socks- Performed by helper: Don/doff right sock, Don/doff left sock Socks - Performed by helper: Don/doff left sock, Don/doff right sock Assist for footwear: Dependant Assist for lower body dressing:  (total)  Function - Toileting Toileting steps completed by helper: Adjust clothing prior to toileting, Performs perineal hygiene, Adjust clothing after toileting Assist level: Two helpers  Function - Archivist transfer assistive device: Mechanical lift Assist level to toilet: 2 helpers Assist level from toilet: 2 helpers  Function - Chair/bed transfer Chair/bed transfer method: Lateral scoot Chair/bed transfer assist  level: 2 helpers Chair/bed transfer assistive device: Sliding board Mechanical lift: [place] Dec Chair/bed transfer details: Manual facilitation for placement, Verbal cues for safe use of DME/AE, Manual facilitation for weight shifting, Verbal cues for technique  Function - Locomotion: Wheelchair Will patient use wheelchair at discharge?: Yes Type: Manual Max  wheelchair distance: 81ft Assist Level: Maximal assistance (Pt 25 - 49%) Assist Level: Maximal assistance (Pt 25 - 49%) Wheel 150 feet activity did not occur: Safety/medical concerns Turns around,maneuvers to table,bed, and toilet,negotiates 3% grade,maneuvers on rugs and over doorsills: No Function - Locomotion: Ambulation Ambulation activity did not occur: Safety/medical concerns Walk 10 feet activity did not occur: Safety/medical concerns Walk 50 feet with 2 turns activity did not occur: Safety/medical concerns Walk 150 feet activity did not occur: Safety/medical concerns Walk 10 feet on uneven surfaces activity did not occur: Safety/medical concerns  Function - Comprehension Comprehension: Auditory Comprehension assist level: Understands basic 90% of the time/cues < 10% of the time  Function - Expression Expression: Verbal Expression assist level: Expresses basic 90% of the time/requires cueing < 10% of the time.  Function - Social Interaction Social Interaction assist level: Interacts appropriately 50 - 74% of the time - May be physically or verbally inappropriate.  Function - Problem Solving Problem solving assist level: Solves basic 25 - 49% of the time - needs direction more than half the time to initiate, plan or complete simple activities  Function - Memory Memory assist level: Recognizes or recalls 25 - 49% of the time/requires cueing 50 - 75% of the time Patient normally able to recall (first 3 days only): Current season, That he or she is in a hospital  Medical Problem List and Plan: 1.  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 Helmet when out of bed.             -CIR, PT, OT SLP, . 2.  DVT Prophylaxis/Anticoagulation: Subcutaneous heparin for DVT prophylaxis 3. Pain Management/chronic back pain: Oxycodone 5 mg every 6 hours as needed 4. Mood: Prozac 40 mg daily.  Abilify 5 mg daily 5. Neuropsych: This patient is capable  of making decisions on his own behalf.Poor attn trial ritalin 5mg  BID 6. Skin/Wound Care: Routine skin checks 7. 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 Gastrostomy PEG tube 12/05/2015. Nocturnal tube feeds recently discontinued. Currently on dysphagia #2 nectar liquid. Good intake this am per RN, No upgrade after repeat MBS             -regular PEG maintenance, may d/c after 01/19/2016 10. Hypertension. Hydralazine 10 mg every 8 hours, amlodipine 5 mg daily, lisinopril 30 mg daily. Monitor with increased mobility in therapy 11. Diabetes mellitus. Lantus insulin 25 units daily at bedtime.improving Intake is good  Check blood sugars before meals and at bedtime,             - CBG (last 3)   Recent Labs  01/02/16 1723 01/02/16 2055 01/03/16 0651  GLUCAP 150* 168* 153*     12. Hyperlipidemia. Lipitor 80 mg daily at bedtime 13.Tobacco abuse. Counseling as appropriate 14.  EKG evidence of type 2 AV block, bradycardia mainly noted with dynamap peripheral pulse, EKG showed nl vent rate, same on repeat EKG, discussed cardiology freq PAC, not type 2 AVB, peripheral pulse is ~1/2 of true ventricular rate Per cardiology who has reviewed EKG, this is atrial bigeminy  LOS (Days) 7 A FACE TO FACE EVALUATION WAS  PERFORMED  Erick Colace 01/03/2016, 7:31 AM

## 2016-01-03 NOTE — Progress Notes (Signed)
Physical Therapy Weekly Progress Note  Patient Details  Name: James Holden MRN: [id number] Date of Birth: 11/01/1960  Beginning of progress report period: December 27, 2015 End of progress report period: January 03, 2016  Today's Date: 01/03/2016 PT Individual Time: 0904-1000 AND 1415-1445 PT Individual Time Calculation (min): 56 min AND 30 min    Patient has met 3 of 5 short term goals.  Continued L neglect prevents safety with sitting balance, transfers,  and WC mobility.   Patient continues to demonstrate the following deficits: L neglect, postural control, absent proprioception, decreased strength in the LLE and UE,  and therefore will continue to benefit from skilled PT intervention to enhance overall performance with activity tolerance, balance, postural control, ability to compensate for deficits, functional use of  left upper extremity and left lower extremity, attention, awareness and coordination.  Patient progressing toward long term goals..  Continue plan of care.  PT Short Term Goals Week 1:  PT Short Term Goal 1 (Week 1): Patient will perfom all bed mobility consistently with Max Assist.  PT Short Term Goal 1 - Progress (Week 1): Met PT Short Term Goal 2 (Week 1): Patient will transfer with +1 consistently with LRAD  PT Short Term Goal 2 - Progress (Week 1): Met PT Short Term Goal 3 (Week 1): patient will initate standing balance PT Short Term Goal 3 - Progress (Week 1): Met PT Short Term Goal 4 (Week 1): Patient will be able to perform sitting balance without UE support with min assist  PT Short Term Goal 4 - Progress (Week 1): Progressing toward goal PT Short Term Goal 5 (Week 1): Patient will perform WC mobility with consistent mod assist x 154f   PT Short Term Goal 5 - Progress (Week 1): Progressing toward goal Week 2:  PT Short Term Goal 1 (Week 2): Patient will perform sitting balance consistently with Min assist  PT Short Term Goal 2 (Week 2): Patient will  initiate gait training,  PT Short Term Goal 3 (Week 2): Patient will perform WC mobility with supervision Assist using Hemi technique  PT Short Term Goal 4 (Week 2): Patient will transfer to R with mod assist via squat pivot or SB  PT Short Term Goal 5 (Week 2): Patient will tolerate standing for up to 4 minutes.    Skilled Therapeutic Interventions/Progress Updates:   Patient received semirecumbent in bed and agreeable to PT.   Supine>sit to R side transfer with max assist from PT. Max cues and manual facilitation provided by PT to increase awareness of the L side and improve active movement.    Squat pivot transfer with max assist to R. Constant cues for proper UE placement, as well as anterior weight shift to  Allow lift of bottom from seat. PT also required to block LLE to prevent bucklig.   [facility]   Lateral scoot transfer to mat table. Patient unable to maintain proper UE positioning to perform squat pivot transfer and self limits to decrease sue of LLE.  Sitting balance EOB with 1 UE-no UE support min assist from PT progressing to supervision assist. Patient instructed to reach for object at midline following gaze to midline then perform lateral reach to the R to engage contralateral trunk.   Sit>supine with max assist with PT to control trunk and LLLE. Supine NMR: bilateral bridge x 8 with emphasis on use of LLE. Flexion/extension in synergy pattern with tape on L knee to improve attention to  and awareness of the LLE.   Sit<>stand at parallel bars with mod assist. Standing at parallel bars x 30 seconds with min assist to block L knee . Patient reports pain is too great in LLE to re-attempt . Returned to room and left sitting in [facility] with call bell in reach and all needs met.    Session 2:   PT fit patient with reclining high back chair to accommodate continued poor postural control with sitting balance as well as reported back pain and prolonged sitting.   SB  transfer to [facility] to R side with mod-max assist form PT with max cues for L lateral lean to allow placement of board.. Reciprocal scooting in chair to improve pelvic alignment and encourage increased sitting tolerance. .   Patient left sitting in [facility] with call bell in reach and all needs met.      Therapy Documentation Precautions: Precautions Precautions: Fall Precaution Comments: L hemiplegia, R hemicraniotomy  Required Braces or Orthoses: Other Brace/Splint Other Brace/Splint: Helmet when OOB Restrictions Weight Bearing Restrictions: No   See Function Navigator for Current Functional Status.  Therapy/Group: Individual Therapy  Lorie Phenix 01/03/2016, 11:07 AM

## 2016-01-04 LAB — URINE CULTURE

## 2016-01-04 LAB — GLUCOSE, CAPILLARY
GLUCOSE-CAPILLARY: 102 mg/dL — AB (ref 65–99)
Glucose-Capillary: 166 mg/dL — ABNORMAL HIGH (ref 65–99)
Glucose-Capillary: 159 mg/dL — ABNORMAL HIGH (ref 65–99)
Glucose-Capillary: 115 mg/dL — ABNORMAL HIGH (ref 65–99)

## 2016-01-04 MED ORDER — BACLOFEN 5 MG HALF TABLET
5.0000 mg | ORAL_TABLET | Freq: Three times a day (TID) | ORAL | Status: DC | PRN
  Administered 2016-01-07 – 2016-01-14 (×4): 5 mg via ORAL
  Filled 2016-01-04 (×4): qty 1

## 2016-01-04 NOTE — Progress Notes (Signed)
Subjective/Complaints: Complains of cramping in right calf at nihgt  ROS: Pt denies fever, rash/itching, headache, blurred or double vision, nausea, vomiting, abdominal pain, diarrhea, chest pain, shortness of breath, palpitations, dysuria, dizziness, neck pain, back pain, bleeding, anxiety, or depression  Objective: Vital Signs: Blood pressure (!) 97/37, pulse (!) 38, temperature 98.6 F (37 C), temperature source Oral, resp. rate 16, height 5\' 10"  (1.778 m), weight 93.1 kg (205 lb 4 oz), SpO2 98 %. No results found. Results for orders placed or performed during the hospital encounter of 12/27/15 (from the past 72 hour(s))  Glucose, capillary     Status: Abnormal   Collection Time: 01/01/16 12:01 PM  Result Value Ref Range   Glucose-Capillary 154 (H) 65 - 99 mg/dL  Glucose, capillary     Status: Abnormal   Collection Time: 01/01/16  4:57 PM  Result Value Ref Range   Glucose-Capillary 148 (H) 65 - 99 mg/dL  Glucose, capillary     Status: Abnormal   Collection Time: 01/01/16  8:38 PM  Result Value Ref Range   Glucose-Capillary 162 (H) 65 - 99 mg/dL   Comment 1 Notify RN   Glucose, capillary     Status: Abnormal   Collection Time: 01/02/16  6:56 AM  Result Value Ref Range   Glucose-Capillary 174 (H) 65 - 99 mg/dL   Comment 1 Notify RN   Glucose, capillary     Status: Abnormal   Collection Time: 01/02/16 12:11 PM  Result Value Ref Range   Glucose-Capillary 197 (H) 65 - 99 mg/dL  Glucose, capillary     Status: Abnormal   Collection Time: 01/02/16  5:23 PM  Result Value Ref Range   Glucose-Capillary 150 (H) 65 - 99 mg/dL  Glucose, capillary     Status: Abnormal   Collection Time: 01/02/16  8:55 PM  Result Value Ref Range   Glucose-Capillary 168 (H) 65 - 99 mg/dL  Urinalysis, Routine w reflex microscopic (not at [facility])     Status: None   Collection Time: 01/02/16 11:01 PM  Result Value Ref Range   Color, Urine YELLOW YELLOW   APPearance CLEAR CLEAR   Specific Gravity, Urine  1.018 1.005 - 1.030   pH 7.5 5.0 - 8.0   Glucose, UA NEGATIVE NEGATIVE mg/dL   Hgb urine dipstick NEGATIVE NEGATIVE   Bilirubin Urine NEGATIVE NEGATIVE   Ketones, ur NEGATIVE NEGATIVE mg/dL   Protein, ur NEGATIVE NEGATIVE mg/dL   Nitrite NEGATIVE NEGATIVE   Leukocytes, UA NEGATIVE NEGATIVE    Comment: MICROSCOPIC NOT DONE ON URINES WITH NEGATIVE PROTEIN, BLOOD, LEUKOCYTES, NITRITE, OR GLUCOSE <1000 mg/dL.  Culture, Urine     Status: Abnormal   Collection Time: 01/02/16 11:01 PM  Result Value Ref Range   Specimen Description URINE, CLEAN CATCH    Special Requests NONE    Culture MULTIPLE SPECIES PRESENT, SUGGEST RECOLLECTION (A)    Report Status 01/04/2016 FINAL   Glucose, capillary     Status: Abnormal   Collection Time: 01/03/16  6:51 AM  Result Value Ref Range   Glucose-Capillary 153 (H) 65 - 99 mg/dL  Glucose, capillary     Status: Abnormal   Collection Time: 01/03/16 12:19 PM  Result Value Ref Range   Glucose-Capillary 152 (H) 65 - 99 mg/dL  Glucose, capillary     Status: Abnormal   Collection Time: 01/03/16  4:35 PM  Result Value Ref Range   Glucose-Capillary 165 (H) 65 - 99 mg/dL  Glucose, capillary     Status: Abnormal  Collection Time: 01/03/16  9:26 PM  Result Value Ref Range   Glucose-Capillary 151 (H) 65 - 99 mg/dL  Glucose, capillary     Status: Abnormal   Collection Time: 01/04/16  6:44 AM  Result Value Ref Range   Glucose-Capillary 166 (H) 65 - 99 mg/dL      General: No acute distress ENT- former trach site CDI, tongue is moist, difficult to visualize pharynx, no cervical adenopathy Mood and affect are appropriate Heart: RRR, bigeminy Lungs: CTA, breathing unlabored, no rales or wheezes Abdomen: Positive bowel sounds, soft nontender to palpation, nondistended, PEG site CDI Extremities: No clubbing, cyanosis, or edema Skin: No evidence of breakdown, no evidence of rash Neurologic: Cranial nerves II through XII intact, motor strength is 5/5 in RIght   deltoid, bicep, tricep, grip, hip flexor, knee extensors, ankle dorsiflexor and plantar flexor, 0/5 on left side except 2-/5 Left hip knee synergy. Trace resting tone left hamstring, heel cord tight. Sensory exam normal sensation to light touch and proprioception in right upper and lower extremities  Musculoskeletal: Full range of motion in all 4 extremities. No joint swelling   Assessment/Plan: 1. Functional deficits secondary to RIght MCA infarct with left hemiplegia which require 3+ hours per day of interdisciplinary therapy in a comprehensive inpatient rehab setting. Physiatrist is providing close team supervision and 24 hour management of active medical problems listed below. Physiatrist and rehab team continue to assess barriers to discharge/monitor patient progress toward functional and medical goals. FIM: Function - Bathing Position: Sitting EOB Body parts bathed by patient: Left arm, Chest, Abdomen, Right upper leg, Left upper leg Body parts bathed by helper: Right arm, Right lower leg, Left lower leg Bathing not applicable: Front perineal area, Buttocks (did not attempt this session) Assist Level:  (mod A)  Function- Upper Body Dressing/Undressing What is the patient wearing?: Pull over shirt/dress Pull over shirt/dress - Perfomed by patient: Thread/unthread right sleeve, Put head through opening Pull over shirt/dress - Perfomed by helper: Thread/unthread left sleeve, Pull shirt over trunk Assist Level:  (total a) Function - Lower Body Dressing/Undressing What is the patient wearing?: Pants, Non-skid slipper socks Position: Bed Pants- Performed by helper: Thread/unthread right pants leg, Thread/unthread left pants leg, Pull pants up/down Non-skid slipper socks- Performed by helper: Don/doff right sock, Don/doff left sock Socks - Performed by helper: Don/doff left sock, Don/doff right sock Assist for footwear: Dependant Assist for lower body dressing:  (total)  Function -  Toileting Toileting steps completed by helper: Adjust clothing prior to toileting, Performs perineal hygiene, Adjust clothing after toileting Assist level: Two helpers  Function - ArchivistToilet Transfers Toilet transfer assistive device: Mechanical lift Assist level to toilet: 2 helpers Assist level from toilet: 2 helpers  Function - Chair/bed transfer Chair/bed transfer method: Lateral scoot Chair/bed transfer assist level: Maximal assist (Pt 25 - 49%/lift and lower) Chair/bed transfer assistive device: Sliding board Mechanical lift: Huntley DecSara Chair/bed transfer details: Manual facilitation for placement, Verbal cues for safe use of DME/AE, Manual facilitation for weight shifting, Verbal cues for technique  Function - Locomotion: Wheelchair Will patient use wheelchair at discharge?: Yes Type: Manual Max wheelchair distance: 1550ft Assist Level: Maximal assistance (Pt 25 - 49%) Assist Level: Maximal assistance (Pt 25 - 49%) Wheel 150 feet activity did not occur: Safety/medical concerns Turns around,maneuvers to table,bed, and toilet,negotiates 3% grade,maneuvers on rugs and over doorsills: No Function - Locomotion: Ambulation Ambulation activity did not occur: Safety/medical concerns Walk 10 feet activity did not occur: Safety/medical concerns Walk 50 feet  with 2 turns activity did not occur: Safety/medical concerns Walk 150 feet activity did not occur: Safety/medical concerns Walk 10 feet on uneven surfaces activity did not occur: Safety/medical concerns  Function - Comprehension Comprehension: Auditory Comprehension assist level: Understands basic 90% of the time/cues < 10% of the time  Function - Expression Expression: Verbal Expression assist level: Expresses basic 90% of the time/requires cueing < 10% of the time.  Function - Social Interaction Social Interaction assist level: Interacts appropriately 75 - 89% of the time - Needs redirection for appropriate language or to initiate  interaction.  Function - Problem Solving Problem solving assist level: Solves basic less than 25% of the time - needs direction nearly all the time or does not effectively solve problems and may need a restraint for safety  Function - Memory Memory assist level: Recognizes or recalls 50 - 74% of the time/requires cueing 25 - 49% of the time Patient normally able to recall (first 3 days only): Current season, That he or she is in a hospital  Medical Problem List and Plan: 1.  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 Helmet when out of bed.             -CIR, PT, OT SLP, . 2.  DVT Prophylaxis/Anticoagulation: Subcutaneous heparin for DVT prophylaxis 3. Pain Management/chronic back pain: Oxycodone 5 mg every 6 hours as needed  -add baclofen prn for cramps/spasms 4. Mood: Prozac 40 mg daily.  Abilify 5 mg daily 5. Neuropsych: This patient is capable of making decisions on his own behalf.Poor attn: continue ritalin 5mg  BID 6. Skin/Wound Care: Routine skin checks 7. 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 Gastrostomy PEG tube 12/05/2015. Nocturnal tube feeds recently discontinued. Currently on dysphagia #2 nectar liquid. Good intake this am per RN, No upgrade after repeat MBS             -regular PEG maintenance, may d/c after 01/19/2016 10. Hypertension. Hydralazine 10 mg every 8 hours, amlodipine 5 mg daily, lisinopril 30 mg daily. Monitor with increased mobility in therapy 11. Diabetes mellitus. Lantus insulin 25 units daily at bedtime.  -fair control  -Intake is good  Check blood sugars before meals and at bedtime,             - CBG (last 3)   Recent Labs  01/03/16 1635 01/03/16 2126 01/04/16 0644  GLUCAP 165* 151* 166*     12. Hyperlipidemia. Lipitor 80 mg daily at bedtime 13.Tobacco abuse. Counseling as appropriate 14.  EKG evidence of type  2 AV block, bradycardia mainly noted with dynamap peripheral pulse, EKG showed nl vent rate, same on repeat EKG, discussed cardiology freq PAC, not type 2 AVB, peripheral pulse is ~1/2 of true ventricular rate Per cardiology who has reviewed EKG, this is atrial bigeminy  LOS (Days) 8 A FACE TO FACE EVALUATION WAS PERFORMED  Karmella Bouvier T 01/04/2016, 10:19 AM

## 2016-01-04 NOTE — Progress Notes (Signed)
Dr. Riley KillSwartz notified of low blood pressure 97/37 and heart rate 35-40.  Blood pressure medications held.

## 2016-01-05 ENCOUNTER — Inpatient Hospital Stay (HOSPITAL_COMMUNITY): Payer: BLUE CROSS/BLUE SHIELD | Admitting: Speech Pathology

## 2016-01-05 DIAGNOSIS — I10 Essential (primary) hypertension: Secondary | ICD-10-CM

## 2016-01-05 LAB — GLUCOSE, CAPILLARY
GLUCOSE-CAPILLARY: 162 mg/dL — AB (ref 65–99)
GLUCOSE-CAPILLARY: 130 mg/dL — AB (ref 65–99)
GLUCOSE-CAPILLARY: 121 mg/dL — AB (ref 65–99)
GLUCOSE-CAPILLARY: 160 mg/dL — AB (ref 65–99)

## 2016-01-05 NOTE — Progress Notes (Signed)
Speech Language Pathology Daily Session Note  Patient Details  Name: James Holden George Alarid MRN: [id number] Date of Birth: 08/29/1960  Today's Date: 01/05/2016 SLP Individual Time: 1200-1230 SLP Individual Time Calculation (min): 30 min   Short Term Goals: Week 2: SLP Short Term Goal 1 (Week 2): Patient will consume trials of thin liquids without overt s/s of aspiration with Min A verbal cues for use of small sips over 2 sessions to assess readiness for repeat MBS.  SLP Short Term Goal 2 (Week 2): Patient will consume current diet without overt s/s of aspiration with min A verbal cues for use of swallowing compensatory strategies.  SLP Short Term Goal 3 (Week 2): Patient will maintain eye contact at midline to conversation partner during functional conversation/task for ~30 seconds in 25% of opportunities  SLP Short Term Goal 4 (Week 2): Patient will perform visual scanning tasks to locate items at midline with mod A multimodal cues in 50% of opportunities.  SLP Short Term Goal 5 (Week 2): Pt will demonstrate basic problem solving in functional tasks with mod A.  Skilled Therapeutic Interventions: Skilled treatment session focused on cognition goals. SLP facilitated the session by providing Max A multimodal cues to perform visual scanning tasks and to locate items at midline in 50% of opportunities. Pt required Max A faded to Mod A verbal cues to demonstrate basic problem solving in functional tasks. Pt was left in bed, with alarm on and all needs within reach. Continue current plan of care.   Function:  Eating Eating   Modified Consistency Diet: Yes Eating Assist Level: Set up assist for   Eating Set Up Assist For: Opening containers;Cutting food       Cognition Comprehension Comprehension assist level: Understands basic 90% of the time/cues < 10% of the time  Expression   Expression assist level: Expresses basic 90% of the time/requires cueing < 10% of the time.  Social Interaction  Social Interaction assist level: Interacts appropriately 75 - 89% of the time - Needs redirection for appropriate language or to initiate interaction.  Problem Solving Problem solving assist level: Solves basic less than 25% of the time - needs direction nearly all the time or does not effectively solve problems and may need a restraint for safety  Memory Memory assist level: Recognizes or recalls 50 - 74% of the time/requires cueing 25 - 49% of the time    Pain    Therapy/Group: Individual Therapy  Deondrae Mcgrail 01/05/2016, 1:43 PM

## 2016-01-05 NOTE — Progress Notes (Signed)
Subjective/Complaints: Pt concerned that head swelled up last night. Spasms better.   ROS: Pt denies fever, rash/itching, headache, blurred or double vision, nausea, vomiting, abdominal pain, diarrhea, chest pain, shortness of breath, palpitations, dysuria, dizziness, neck pain, back pain, bleeding, anxiety, or depression  Objective: Vital Signs: Blood pressure (!) 135/50, pulse (!) 43, temperature 98.3 F (36.8 C), temperature source Oral, resp. rate 18, height 5\' 10"  (1.778 m), weight 98.2 kg (216 lb 7.9 oz), SpO2 98 %. No results found. Results for orders placed or performed during the hospital encounter of 12/27/15 (from the past 72 hour(s))  Glucose, capillary     Status: Abnormal   Collection Time: 01/02/16 12:11 PM  Result Value Ref Range   Glucose-Capillary 197 (H) 65 - 99 mg/dL  Glucose, capillary     Status: Abnormal   Collection Time: 01/02/16  5:23 PM  Result Value Ref Range   Glucose-Capillary 150 (H) 65 - 99 mg/dL  Glucose, capillary     Status: Abnormal   Collection Time: 01/02/16  8:55 PM  Result Value Ref Range   Glucose-Capillary 168 (H) 65 - 99 mg/dL  Urinalysis, Routine w reflex microscopic ([facility])     Status: None   Collection Time: 01/02/16 11:01 PM  Result Value Ref Range   Color, Urine YELLOW YELLOW   APPearance CLEAR CLEAR   Specific Gravity, Urine 1.018 1.005 - 1.030   pH 7.5 5.0 - 8.0   Glucose, UA NEGATIVE NEGATIVE mg/dL   Hgb urine dipstick NEGATIVE NEGATIVE   Bilirubin Urine NEGATIVE NEGATIVE   Ketones, ur NEGATIVE NEGATIVE mg/dL   Protein, ur NEGATIVE NEGATIVE mg/dL   Nitrite NEGATIVE NEGATIVE   Leukocytes, UA NEGATIVE NEGATIVE    Comment: MICROSCOPIC NOT DONE ON URINES WITH NEGATIVE PROTEIN, BLOOD, LEUKOCYTES, NITRITE, OR GLUCOSE <1000 mg/dL.  Culture, Urine     Status: Abnormal   Collection Time: 01/02/16 11:01 PM  Result Value Ref Range   Specimen Description URINE, CLEAN CATCH    Special Requests NONE    Culture MULTIPLE SPECIES  PRESENT, SUGGEST RECOLLECTION (A)    Report Status 01/04/2016 FINAL   Glucose, capillary     Status: Abnormal   Collection Time: 01/03/16  6:51 AM  Result Value Ref Range   Glucose-Capillary 153 (H) 65 - 99 mg/dL  Glucose, capillary     Status: Abnormal   Collection Time: 01/03/16 12:19 PM  Result Value Ref Range   Glucose-Capillary 152 (H) 65 - 99 mg/dL  Glucose, capillary     Status: Abnormal   Collection Time: 01/03/16  4:35 PM  Result Value Ref Range   Glucose-Capillary 165 (H) 65 - 99 mg/dL  Glucose, capillary     Status: Abnormal   Collection Time: 01/03/16  9:26 PM  Result Value Ref Range   Glucose-Capillary 151 (H) 65 - 99 mg/dL  Glucose, capillary     Status: Abnormal   Collection Time: 01/04/16  6:44 AM  Result Value Ref Range   Glucose-Capillary 166 (H) 65 - 99 mg/dL  Glucose, capillary     Status: Abnormal   Collection Time: 01/04/16 11:54 AM  Result Value Ref Range   Glucose-Capillary 159 (H) 65 - 99 mg/dL  Glucose, capillary     Status: Abnormal   Collection Time: 01/04/16  4:48 PM  Result Value Ref Range   Glucose-Capillary 102 (H) 65 - 99 mg/dL  Glucose, capillary     Status: Abnormal   Collection Time: 01/04/16  9:15 PM  Result Value  Ref Range   Glucose-Capillary 115 (H) 65 - 99 mg/dL  Glucose, capillary     Status: Abnormal   Collection Time: 01/05/16  6:54 AM  Result Value Ref Range   Glucose-Capillary 162 (H) 65 - 99 mg/dL      General: No acute distress ENT- former trach site CDI, oral mucosa moistMood and affect are appropriate Head: craniectomy site without change--no swelling Heart: RRR, bigeminy Lungs: CTA Abdomen: +BS, soft nontender to palpation, nondistended, PEG site CDI Extremities: no edema Skin: no rash Neurologic: Cranial nerves II through XII intact, motor strength is 5/5 in RIght  deltoid, bicep, tricep, grip, hip flexor, knee extensors, ankle dorsiflexor and plantar flexor, 0/5 on left side except 2-/5 Left hip knee synergy. Trace  resting tone left hamstring, heel cord tight. --no change. Sensory exam normal sensation to light touch and proprioception in right upper and lower extremities  Musculoskeletal: Normal ROM all 4 extremities. No joint swelling   Assessment/Plan: 1. Functional deficits secondary to RIght MCA infarct with left hemiplegia which require 3+ hours per day of interdisciplinary therapy in a comprehensive inpatient rehab setting. Physiatrist is providing close team supervision and 24 hour management of active medical problems listed below. Physiatrist and rehab team continue to assess barriers to discharge/monitor patient progress toward functional and medical goals. FIM: Function - Bathing Position: Sitting EOB Body parts bathed by patient: Left arm, Chest, Abdomen, Right upper leg, Left upper leg Body parts bathed by helper: Right arm, Right lower leg, Left lower leg Bathing not applicable: Front perineal area, Buttocks (did not attempt this session) Assist Level:  (mod A)  Function- Upper Body Dressing/Undressing What is the patient wearing?: Pull over shirt/dress Pull over shirt/dress - Perfomed by patient: Thread/unthread right sleeve, Put head through opening Pull over shirt/dress - Perfomed by helper: Thread/unthread left sleeve, Pull shirt over trunk Assist Level:  (total a) Function - Lower Body Dressing/Undressing What is the patient wearing?: Pants, Non-skid slipper socks Position: Bed Pants- Performed by helper: Thread/unthread right pants leg, Thread/unthread left pants leg, Pull pants up/down Non-skid slipper socks- Performed by helper: Don/doff right sock, Don/doff left sock Socks - Performed by helper: Don/doff left sock, Don/doff right sock Assist for footwear: Dependant Assist for lower body dressing:  (total)  Function - Toileting Toileting steps completed by helper: Adjust clothing prior to toileting, Performs perineal hygiene, Adjust clothing after toileting Assist level:  Two helpers  Function - ArchivistToilet Transfers Toilet transfer assistive device: Mechanical lift Assist level to toilet: 2 helpers Assist level from toilet: 2 helpers  Function - Chair/bed transfer Chair/bed transfer method: Lateral scoot Chair/bed transfer assist level: Maximal assist (Pt 25 - 49%/lift and lower) Chair/bed transfer assistive device: Sliding board Mechanical lift: Huntley DecSara Chair/bed transfer details: Manual facilitation for placement, Verbal cues for safe use of DME/AE, Manual facilitation for weight shifting, Verbal cues for technique  Function - Locomotion: Wheelchair Will patient use wheelchair at discharge?: Yes Type: Manual Max wheelchair distance: 5850ft Assist Level: Maximal assistance (Pt 25 - 49%) Assist Level: Maximal assistance (Pt 25 - 49%) Wheel 150 feet activity did not occur: Safety/medical concerns Turns around,maneuvers to table,bed, and toilet,negotiates 3% grade,maneuvers on rugs and over doorsills: No Function - Locomotion: Ambulation Ambulation activity did not occur: Safety/medical concerns Walk 10 feet activity did not occur: Safety/medical concerns Walk 50 feet with 2 turns activity did not occur: Safety/medical concerns Walk 150 feet activity did not occur: Safety/medical concerns Walk 10 feet on uneven surfaces activity did not occur: Safety/medical  concerns  Function - Comprehension Comprehension: Auditory Comprehension assist level: Understands basic 90% of the time/cues < 10% of the time  Function - Expression Expression: Verbal Expression assist level: Expresses basic 90% of the time/requires cueing < 10% of the time.  Function - Social Interaction Social Interaction assist level: Interacts appropriately with others - No medications needed.  Function - Problem Solving Problem solving assist level: Solves basic problems with no assist  Function - Memory Memory assist level: Recognizes or recalls 50 - 74% of the time/requires cueing 25 -  49% of the time Patient normally able to recall (first 3 days only): Current season, Staff names and faces, That he or she is in a hospital  Medical Problem List and Plan: 1.  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  .             -CIR, PT, OT SLP,   -reassured pt that head was at baseline  -needs to continue wearing helmet when up OOB 2.  DVT Prophylaxis/Anticoagulation: Subcutaneous heparin for DVT prophylaxis 3. Pain Management/chronic back pain: Oxycodone 5 mg every 6 hours as needed  -added baclofen prn for cramps/spasms 4. Mood: Prozac 40 mg daily.  Abilify 5 mg daily 5. Neuropsych: This patient is capable of making decisions on his own behalf.Poor attn: continue ritalin 5mg  BID 6. Skin/Wound Care: Routine skin checks 7. 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 Gastrostomy PEG tube 12/05/2015. Nocturnal tube feeds recently discontinued. Currently on dysphagia #2 nectar liquid. Good intake this am per RN, No upgrade after repeat MBS             -regular PEG maintenance, may d/c after 01/19/2016 10. Hypertension. Now hypotensive:  -dc Hydralazine 10 mg every 8 hours  -continue amlodipine 5 mg daily, lisinopril 30 mg daily with hold parameters 11. Diabetes mellitus. Lantus insulin 25 units daily at bedtime.  -reasonable control  -Intake is good  Check blood sugars before meals and at bedtime,             - CBG (last 3)   Recent Labs  01/04/16 1648 01/04/16 2115 01/05/16 0654  GLUCAP 102* 115* 162*     12. Hyperlipidemia. Lipitor 80 mg daily at bedtime 13.Tobacco abuse. Counseling as appropriate 14.  EKG evidence of type 2 AV block, bradycardia mainly noted with dynamap peripheral pulse, EKG showed nl vent rate, same on repeat EKG, discussed cardiology freq PAC, not type 2 AVB, peripheral pulse is ~1/2 of true ventricular rate Per  cardiology who has reviewed EKG, this is atrial bigeminy  LOS (Days) 9 A FACE TO FACE EVALUATION WAS PERFORMED  James Holden T 01/05/2016, 9:23 AM

## 2016-01-06 ENCOUNTER — Inpatient Hospital Stay (HOSPITAL_COMMUNITY): Payer: BLUE CROSS/BLUE SHIELD | Admitting: Speech Pathology

## 2016-01-06 ENCOUNTER — Inpatient Hospital Stay (HOSPITAL_COMMUNITY): Payer: BLUE CROSS/BLUE SHIELD | Admitting: Occupational Therapy

## 2016-01-06 ENCOUNTER — Inpatient Hospital Stay (HOSPITAL_COMMUNITY): Payer: BLUE CROSS/BLUE SHIELD

## 2016-01-06 LAB — GLUCOSE, CAPILLARY
GLUCOSE-CAPILLARY: 147 mg/dL — AB (ref 65–99)
GLUCOSE-CAPILLARY: 170 mg/dL — AB (ref 65–99)
GLUCOSE-CAPILLARY: 142 mg/dL — AB (ref 65–99)
Glucose-Capillary: 128 mg/dL — ABNORMAL HIGH (ref 65–99)

## 2016-01-06 MED ORDER — METHYLPHENIDATE HCL 5 MG PO TABS
10.0000 mg | ORAL_TABLET | Freq: Two times a day (BID) | ORAL | Status: DC
Start: 2016-01-06 — End: 2016-01-28
  Administered 2016-01-06 – 2016-01-28 (×45): 10 mg via ORAL
  Filled 2016-01-06 (×45): qty 2

## 2016-01-06 NOTE — Progress Notes (Signed)
Speech Language Pathology Daily Session Note  Patient Details  Name: James Holden MRN: [id number] Date of Birth: 09/25/1960  Today's Date: 01/06/2016 SLP Individual Time: 1405-1500 SLP Individual Time Calculation (min): 55 min   Short Term Goals: Week 2: SLP Short Term Goal 1 (Week 2): Patient will consume trials of thin liquids without overt s/s of aspiration with Min A verbal cues for use of small sips over 2 sessions to assess readiness for repeat MBS.  SLP Short Term Goal 2 (Week 2): Patient will consume current diet without overt s/s of aspiration with min A verbal cues for use of swallowing compensatory strategies.  SLP Short Term Goal 3 (Week 2): Patient will maintain eye contact at midline to conversation partner during functional conversation/task for ~30 seconds in 25% of opportunities  SLP Short Term Goal 4 (Week 2): Patient will perform visual scanning tasks to locate items at midline with mod A multimodal cues in 50% of opportunities.  SLP Short Term Goal 5 (Week 2): Pt will demonstrate basic problem solving in functional tasks with mod A.  Skilled Therapeutic Interventions: Pt was seen for skilled ST targeting goals for cognition and dysphagia.  SLP facilitated the session with trials of thin liquids to continue working towards diet progression.  Pt demonstrated appropriate rate and portion control without overt s/s of aspiration except for x1 immediate reflexive cough towards the end of today's therapy session which SLP suspects to be related to positioning and fatigue as pt was upright at the beginning of therapy session and began to slide down in bed towards the end of session.  Pt needed max assist multimodal cues to identify matches between two cards, even when both cards were placed in his right visual field.  Suspect memory, visual acuity and attention to task impacting function.  Pt was able to sort playing cards into groups of 4 by suits with overall mod assist verbal  cues for use of visual scanning techniques to attend to items on his left.  Pt was left in bed with bed alarm set and call bell within reach.  Continue per current plan of care.    Function:  Eating Eating   Modified Consistency Diet: Yes Eating Assist Level: Supervision or verbal cues   Eating Set Up Assist For: Opening containers Helper Scoops Food on Utensil: Occasionally     Cognition Comprehension Comprehension assist level: Understands basic 90% of the time/cues < 10% of the time  Expression   Expression assist level: Expresses basic 90% of the time/requires cueing < 10% of the time.  Social Interaction Social Interaction assist level: Interacts appropriately 75 - 89% of the time - Needs redirection for appropriate language or to initiate interaction.  Problem Solving Problem solving assist level: Solves basic 25 - 49% of the time - needs direction more than half the time to initiate, plan or complete simple activities  Memory Memory assist level: Recognizes or recalls 50 - 74% of the time/requires cueing 25 - 49% of the time    Pain Pain Assessment Pain Assessment: No/denies pain  Therapy/Group: Individual Therapy  James Holden, Melanee SpryNicole L 01/06/2016, 3:01 PM

## 2016-01-06 NOTE — Progress Notes (Signed)
Subjective/Complaints: No concerns, feels like he can move Left arm  and Left leg better  ROS: Pt denies  nausea, vomiting, abdominal pain, diarrhea, chest pain, shortness of breath, palpitations, dysuria, dizziness, no joint pain  Objective: Vital Signs: Blood pressure 112/61, pulse (!) 43, temperature 98.6 F (37 C), temperature source Oral, resp. rate 16, height 5\' 10"  (1.778 m), weight 91.6 kg (201 lb 15.1 oz), SpO2 98 %. No results found. Results for orders placed or performed during the hospital encounter of 12/27/15 (from the past 72 hour(s))  Glucose, capillary     Status: Abnormal   Collection Time: 01/03/16 12:19 PM  Result Value Ref Range   Glucose-Capillary 152 (H) 65 - 99 mg/dL  Glucose, capillary     Status: Abnormal   Collection Time: 01/03/16  4:35 PM  Result Value Ref Range   Glucose-Capillary 165 (H) 65 - 99 mg/dL  Glucose, capillary     Status: Abnormal   Collection Time: 01/03/16  9:26 PM  Result Value Ref Range   Glucose-Capillary 151 (H) 65 - 99 mg/dL  Glucose, capillary     Status: Abnormal   Collection Time: 01/04/16  6:44 AM  Result Value Ref Range   Glucose-Capillary 166 (H) 65 - 99 mg/dL  Glucose, capillary     Status: Abnormal   Collection Time: 01/04/16 11:54 AM  Result Value Ref Range   Glucose-Capillary 159 (H) 65 - 99 mg/dL  Glucose, capillary     Status: Abnormal   Collection Time: 01/04/16  4:48 PM  Result Value Ref Range   Glucose-Capillary 102 (H) 65 - 99 mg/dL  Glucose, capillary     Status: Abnormal   Collection Time: 01/04/16  9:15 PM  Result Value Ref Range   Glucose-Capillary 115 (H) 65 - 99 mg/dL  Glucose, capillary     Status: Abnormal   Collection Time: 01/05/16  6:54 AM  Result Value Ref Range   Glucose-Capillary 162 (H) 65 - 99 mg/dL  Glucose, capillary     Status: Abnormal   Collection Time: 01/05/16 11:28 AM  Result Value Ref Range   Glucose-Capillary 130 (H) 65 - 99 mg/dL  Glucose, capillary     Status: Abnormal   Collection Time: 01/05/16  4:35 PM  Result Value Ref Range   Glucose-Capillary 121 (H) 65 - 99 mg/dL  Glucose, capillary     Status: Abnormal   Collection Time: 01/05/16  8:39 PM  Result Value Ref Range   Glucose-Capillary 160 (H) 65 - 99 mg/dL   Comment 1 Notify RN   Glucose, capillary     Status: Abnormal   Collection Time: 01/06/16  6:48 AM  Result Value Ref Range   Glucose-Capillary 147 (H) 65 - 99 mg/dL   Comment 1 Notify RN       General: No acute distress ENT- former trach site CDI, oral mucosa moistMood and affect are appropriate Head: craniectomy site without change--no swelling Heart: RRR, bigeminy Lungs: CTA Abdomen: +BS, soft nontender to palpation, nondistended, PEG site CDI Extremities: no edema Skin: no rash Neurologic: Cranial nerves II through XII intact, motor strength is 5/5 in RIght  deltoid, bicep, tricep, grip, hip flexor, knee extensors, ankle dorsiflexor and plantar flexor, 2-/5 Left hip flexion 2-/5 Left hip knee synergy. Trace resting tone left hamstring, heel cord tight. -0/5 LUE. Sensory exam normal sensation to light touch and proprioception in right upper and lower extremities  Musculoskeletal: Normal ROM all 4 extremities. No joint swelling   Assessment/Plan: 1. Functional  deficits secondary to RIght MCA infarct with left hemiplegia which require 3+ hours per day of interdisciplinary therapy in a comprehensive inpatient rehab setting. Physiatrist is providing close team supervision and 24 hour management of active medical problems listed below. Physiatrist and rehab team continue to assess barriers to discharge/monitor patient progress toward functional and medical goals. FIM: Function - Bathing Position: Sitting EOB Body parts bathed by patient: Left arm, Chest, Abdomen, Right upper leg, Left upper leg Body parts bathed by helper: Right arm, Right lower leg, Left lower leg Bathing not applicable: Front perineal area, Buttocks (did not attempt this  session) Assist Level:  (mod A)  Function- Upper Body Dressing/Undressing What is the patient wearing?: Pull over shirt/dress Pull over shirt/dress - Perfomed by patient: Thread/unthread right sleeve, Put head through opening Pull over shirt/dress - Perfomed by helper: Thread/unthread left sleeve, Pull shirt over trunk Assist Level:  (total a) Function - Lower Body Dressing/Undressing What is the patient wearing?: Pants Position: Bed Pants- Performed by helper: Thread/unthread right pants leg, Thread/unthread left pants leg, Pull pants up/down Non-skid slipper socks- Performed by helper: Don/doff right sock, Don/doff left sock Socks - Performed by helper: Don/doff right sock, Don/doff left sock Assist for footwear: Dependant Assist for lower body dressing:  (total)  Function - Toileting Toileting steps completed by helper: Adjust clothing prior to toileting, Performs perineal hygiene, Adjust clothing after toileting Toileting Assistive Devices: Other (comment) (lift) Assist level: Two helpers, Set up/obtain supplies, Supervision or verbal cues  Function - Toilet Transfers Toilet transfer assistive device: Mechanical lift Mechanical lift: Huntley DecSara Assist level to toilet: 2 helpers Assist level from toilet: 2 helpers  Function - Chair/bed transfer Chair/bed transfer method: Lateral scoot Chair/bed transfer assist level: Maximal assist (Pt 25 - 49%/lift and lower) Chair/bed transfer assistive device: Sliding board Mechanical lift: Huntley DecSara Chair/bed transfer details: Manual facilitation for placement, Verbal cues for safe use of DME/AE, Manual facilitation for weight shifting, Verbal cues for technique  Function - Locomotion: Wheelchair Will patient use wheelchair at discharge?: Yes Type: Manual Max wheelchair distance: 7250ft Assist Level: Maximal assistance (Pt 25 - 49%) Assist Level: Maximal assistance (Pt 25 - 49%) Wheel 150 feet activity did not occur: Safety/medical concerns Turns  around,maneuvers to table,bed, and toilet,negotiates 3% grade,maneuvers on rugs and over doorsills: No Function - Locomotion: Ambulation Ambulation activity did not occur: Safety/medical concerns Walk 10 feet activity did not occur: Safety/medical concerns Walk 50 feet with 2 turns activity did not occur: Safety/medical concerns Walk 150 feet activity did not occur: Safety/medical concerns Walk 10 feet on uneven surfaces activity did not occur: Safety/medical concerns  Function - Comprehension Comprehension: Auditory Comprehension assist level: Understands basic 90% of the time/cues < 10% of the time  Function - Expression Expression: Verbal Expression assist level: Expresses basic 90% of the time/requires cueing < 10% of the time.  Function - Social Interaction Social Interaction assist level: Interacts appropriately 75 - 89% of the time - Needs redirection for appropriate language or to initiate interaction.  Function - Problem Solving Problem solving assist level: Solves basic less than 25% of the time - needs direction nearly all the time or does not effectively solve problems and may need a restraint for safety  Function - Memory Memory assist level: Recognizes or recalls 50 - 74% of the time/requires cueing 25 - 49% of the time Patient normally able to recall (first 3 days only): Current season, Staff names and faces, That he or she is in a hospital  Medical  Problem List and Plan: 1.  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  .             -CIR, PT, OT SLP,   -improved LLE hip flexion 2.  DVT Prophylaxis/Anticoagulation: Subcutaneous heparin for DVT prophylaxis 3. Pain Management/chronic back pain: Oxycodone 5 mg every 6 hours as needed  -added baclofen prn for cramps/spasms 4. Mood: Prozac 40 mg daily.  Abilify 5 mg daily 5. Neuropsych: This patient is capable of making decisions on his own behalf.Poor attn: increase ritalin  10mg  BID 6. Skin/Wound Care: Routine skin checks 7. 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 Gastrostomy PEG tube 12/05/2015. Nocturnal tube feeds recently discontinued. Currently on dysphagia #2 nectar liquid. Good intake this am per RN, No upgrade after repeat MBS             -regular PEG maintenance, may d/c after 01/19/2016 10. Hypertension. Now hypotensive:  -dc Hydralazine 10 mg every 8 hours  -continue amlodipine 5 mg daily, lisinopril 30 mg daily with hold parameters 11. Diabetes mellitus. Lantus insulin 25 units daily at bedtime.  -reasonable control  -Intake is good  Check blood sugars before meals and at bedtime,             - CBG (last 3)   Recent Labs  01/05/16 1635 01/05/16 2039 01/06/16 0648  GLUCAP 121* 160* 147*     12. Hyperlipidemia. Lipitor 80 mg daily at bedtime 13.Tobacco abuse. Counseling as appropriate 14.  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

## 2016-01-06 NOTE — Progress Notes (Signed)
Speech Language Pathology Daily Session Note  Patient Details  Name: James Holden MRN: [id number] Date of Birth: 12/19/1960  Today's Date: 01/06/2016 SLP Individual Time: 1130-1200 SLP Individual Time Calculation (min): 30 min   Short Term Goals: Week 2: SLP Short Term Goal 1 (Week 2): Patient will consume trials of thin liquids without overt s/s of aspiration with Min A verbal cues for use of small sips over 2 sessions to assess readiness for repeat MBS.  SLP Short Term Goal 2 (Week 2): Patient will consume current diet without overt s/s of aspiration with min A verbal cues for use of swallowing compensatory strategies.  SLP Short Term Goal 3 (Week 2): Patient will maintain eye contact at midline to conversation partner during functional conversation/task for ~30 seconds in 25% of opportunities  SLP Short Term Goal 4 (Week 2): Patient will perform visual scanning tasks to locate items at midline with mod A multimodal cues in 50% of opportunities.  SLP Short Term Goal 5 (Week 2): Pt will demonstrate basic problem solving in functional tasks with mod A.  Skilled Therapeutic Interventions:   Skilled treatment session focused on addressing dysphagia and cognition goals. SLP facilitated session by providing set-up assist of Dys.3 and regular textures as well as thin liquids via cup.  Patient required Max assist multimodal cues to scan to midline to locate utensils and cup for self-feeding and well as Max assist verbal cues for redirect to self-feeding following each bite.  Patient with Mod cues for monitoring and management of buccal pocketing which was effective at preventing overt s/s of aspiration with PO.  Recommend an advanced trial try upon next visit with SLP.  Discussed change in plan of care with level of supervision needed with RN and nurse tech; as a result all in agreement to leave up in chair with quick release belt on and frequent checks.     Function:  Eating Eating   Modified  Consistency Diet: Yes Eating Assist Level: Set up assist for;Supervision or verbal cues;Helper checks for pocketed food;Helper scoops food on utensil   Eating Set Up Assist For: Opening containers Helper Scoops Food on Utensil: Occasionally     Cognition Comprehension Comprehension assist level: Understands basic 90% of the time/cues < 10% of the time  Expression   Expression assist level: Expresses basic 90% of the time/requires cueing < 10% of the time.  Social Interaction Social Interaction assist level: Interacts appropriately 75 - 89% of the time - Needs redirection for appropriate language or to initiate interaction.  Problem Solving Problem solving assist level: Solves basic less than 25% of the time - needs direction nearly all the time or does not effectively solve problems and may need a restraint for safety  Memory Memory assist level: Recognizes or recalls 50 - 74% of the time/requires cueing 25 - 49% of the time    Pain Pain Assessment Pain Assessment: No/denies pain Faces Pain Scale: Hurts little more  Therapy/Group: Individual Therapy  Charlane FerrettiMelissa Montrice Gracey, M.A., CCC-SLP [phone]  Erikka Follmer 01/06/2016, 12:46 PM

## 2016-01-06 NOTE — Progress Notes (Signed)
Occupational Therapy Session Note  Patient Details  Name: James Holden MRN: [id number] Date of Birth: 05/22/1960  Today's Date: 01/06/2016 OT Individual Time: 1003-1103 OT Individual Time Calculation (min): 60 min     Short Term Goals: Week 2:  OT Short Term Goal 1 (Week 2): Pt will maintain sitting balance min assist unsupported during selfcare tasks.  OT Short Term Goal 2 (Week 2): Pt will complete sit to stand for LB bathing and dressing with no more than mod assist level. OT Short Term Goal 3 (Week 2): Pt will complete UB dressing with min assist and mod demonstrational cueing  OT Short Term Goal 4 (Week 2): Pt will initiate and complete supine to sit on the left side with mod assist in preparation for selfcare tasks.  OT Short Term Goal 5 (Week 2): Pt will visually scan left of midline to locate grooming, bathing, or feeding items with no more than mod instructional cueing.    Skilled Therapeutic Interventions/Progress Updates:    Pt worked on Chief of Staffneuromuscular re-education in the therapy gym to start session.  Mod assist for squat pivot transfer to the right from wheelchair to therapy mat.  Attempted focusing on visual scanning to the left and midline orientation.  Pt able to sit with min guard assist but tends to lean back on the RUE for support.  Attempted use of mirror for feedback with therapist providing max demonstrational cueing for head turn to the left.  Pt unable to scan eyes past midline to the left at this time.   After sitting for 10 mins pt began to report increased pain in the left hip and back, causing internal distraction.  Pt became slightly agitated and therapist agreed to let him lay down for a couple of mins to relieve pain.  Once this was completed he agreed to sit back up on the EOM, even recalling steps to roll to the right that therapist educated him on during Friday's session.  Max assist for completion of transition to sitting however.  Completed standing and  functional mobility using three muskateers technique.  Max assist needed for advancing the LLE forward and for support during weightbearing.  Pt continues to need encouragement to try and not concentrate on pain issues.  Returned to room where he completed oral hygiene with mod demonstrational cueing for thoroughness and supervision in order to drink water per protocol.  Pt left up in wheelchair with 1/2 lap tray in place supporting the LUE and call button on his right side.  Therapist cut down his soft helmet over his eyes as it was bothering him and causing him discomfort.    Therapy Documentation Precautions:  Precautions Precautions: Fall Precaution Comments: L hemiplegia, R hemicraniotomy  Required Braces or Orthoses: Other Brace/Splint Other Brace/Splint: Helmet when OOB Restrictions Weight Bearing Restrictions: No  Pain: Pain Assessment Pain Assessment: No/denies pain Faces Pain Scale: Hurts little more Pain Type: Acute pain Pain Location: Hip Pain Orientation: Left Pain Descriptors / Indicators: Discomfort;Jabbing Pain Intervention(s): Repositioned ADL: See Function Navigator for Current Functional Status.   Therapy/Group: Individual Therapy  Kamdin Follett OTR/L 01/06/2016, 12:54 PM

## 2016-01-06 NOTE — Progress Notes (Signed)
Physical Therapy Note  Patient Details  Name: James Holden MRN: [id number] Date of Birth: 05/22/1960 Today's Date: 01/06/2016  0800-0900, 60 min individual tx Pain: none reported  Pt much more alert, pleasant, and participatory today. Bed mobility to sit on L side of bed and sit EOB x 15 minutes to eat breakfast.  Mod assist intermittently, mod cues for midline orientation for siiting balance, and max cues to attend to L.  Pt was safe and slow with reating, with min cues.  Slide board transfer to R with min/mod assist into recliner w/c.  neuromuscular re-education via forced use, multimodal cues for alternating reciprocal movements x bil LEs on Kinetron at setting, 60, x 25 cycles x 5. Sit> stand from w/c using R railing of wooden steps, x 10 seconds x 2, x 30 seconds x 1 during R><L wt shifting.  Pt demonstrated L knee stability in standing with multimodal cueing.  Pt left resting in w/c with quick release belt donned, all needs within reach.   See function navigator for current status.  Vedh Ptacek 01/06/2016, 7:48 AM

## 2016-01-07 ENCOUNTER — Inpatient Hospital Stay (HOSPITAL_COMMUNITY): Payer: BLUE CROSS/BLUE SHIELD | Admitting: Occupational Therapy

## 2016-01-07 ENCOUNTER — Inpatient Hospital Stay (HOSPITAL_COMMUNITY): Payer: BLUE CROSS/BLUE SHIELD | Admitting: Physical Therapy

## 2016-01-07 ENCOUNTER — Inpatient Hospital Stay (HOSPITAL_COMMUNITY): Payer: BLUE CROSS/BLUE SHIELD | Admitting: Speech Pathology

## 2016-01-07 LAB — GLUCOSE, CAPILLARY
GLUCOSE-CAPILLARY: 108 mg/dL — AB (ref 65–99)
Glucose-Capillary: 120 mg/dL — ABNORMAL HIGH (ref 65–99)
Glucose-Capillary: 161 mg/dL — ABNORMAL HIGH (ref 65–99)
Glucose-Capillary: 181 mg/dL — ABNORMAL HIGH (ref 65–99)

## 2016-01-07 MED ORDER — ROPINIROLE HCL 0.25 MG PO TABS
0.2500 mg | ORAL_TABLET | Freq: Every day | ORAL | Status: DC
  Administered 2016-01-07 – 2016-01-27 (×21): 0.25 mg via ORAL
  Filled 2016-01-07 (×21): qty 1

## 2016-01-07 NOTE — Progress Notes (Signed)
Occupational Therapy Session Note  Patient Details  Name: James Holden MRN: [id number] Date of Birth: 09/08/1960  Today's Date: 01/07/2016 OT Individual Time: 1501-1530 OT Individual Time Calculation (min): 29 min     Skilled Therapeutic Interventions/Progress Updates:    Applied NMES to left shoulder for treatment of left humeral subluxation.  Electrical stimulation pads placed over medial deltoid and supraspinatus.  Stimulation on 10 seconds off 20 seconds.  PPS at 35 with pulse width at 300.  Completed 12 mins of stimulation without any adverse reactions.  Pt completed transfer from wheelchair to bed and then to supine to complete session.  Max assist for squat pivot transfer to the bed as well as for transition to supine.  Pt left in bed with call button in reach and bed alarm in place.   Therapy Documentation Precautions:  Precautions Precautions: Fall Precaution Comments: L hemiplegia, R hemicraniotomy  Required Braces or Orthoses: Other Brace/Splint Other Brace/Splint: Helmet when OOB Restrictions Weight Bearing Restrictions: No  Pain: Pain Assessment Pain Assessment: Faces Faces Pain Scale: Hurts a little bit Pain Type: Acute pain Pain Location: Hip Pain Orientation: Left Pain Descriptors / Indicators: Discomfort Pain Intervention(s): Repositioned ADL: See Function Navigator for Current Functional Status.   Therapy/Group: Individual Therapy  Maybel Dambrosio OTR/L 01/07/2016, 4:23 PM

## 2016-01-07 NOTE — Progress Notes (Signed)
Subjective/Complaints: Pt states he has urge to move both legs at night and this keeps him up, had this prior to CVA  ROS: Pt denies  nausea, vomiting, abdominal pain, diarrhea, chest pain, shortness of breath, palpitations, dysuria, dizziness, no joint pain  Objective: Vital Signs: Blood pressure 138/78, pulse (!) 55, temperature 97.7 F (36.5 C), temperature source Oral, resp. rate 16, height 5\' 10"  (1.778 m), weight 92 kg (202 lb 13.2 oz), SpO2 96 %. No results found. Results for orders placed or performed during the hospital encounter of 12/27/15 (from the past 72 hour(s))  Glucose, capillary     Status: Abnormal   Collection Time: 01/04/16 11:54 AM  Result Value Ref Range   Glucose-Capillary 159 (H) 65 - 99 mg/dL  Glucose, capillary     Status: Abnormal   Collection Time: 01/04/16  4:48 PM  Result Value Ref Range   Glucose-Capillary 102 (H) 65 - 99 mg/dL  Glucose, capillary     Status: Abnormal   Collection Time: 01/04/16  9:15 PM  Result Value Ref Range   Glucose-Capillary 115 (H) 65 - 99 mg/dL  Glucose, capillary     Status: Abnormal   Collection Time: 01/05/16  6:54 AM  Result Value Ref Range   Glucose-Capillary 162 (H) 65 - 99 mg/dL  Glucose, capillary     Status: Abnormal   Collection Time: 01/05/16 11:28 AM  Result Value Ref Range   Glucose-Capillary 130 (H) 65 - 99 mg/dL  Glucose, capillary     Status: Abnormal   Collection Time: 01/05/16  4:35 PM  Result Value Ref Range   Glucose-Capillary 121 (H) 65 - 99 mg/dL  Glucose, capillary     Status: Abnormal   Collection Time: 01/05/16  8:39 PM  Result Value Ref Range   Glucose-Capillary 160 (H) 65 - 99 mg/dL   Comment 1 Notify RN   Glucose, capillary     Status: Abnormal   Collection Time: 01/06/16  6:48 AM  Result Value Ref Range   Glucose-Capillary 147 (H) 65 - 99 mg/dL   Comment 1 Notify RN   Glucose, capillary     Status: Abnormal   Collection Time: 01/06/16 11:28 AM  Result Value Ref Range   Glucose-Capillary 170 (H) 65 - 99 mg/dL  Glucose, capillary     Status: Abnormal   Collection Time: 01/06/16  4:31 PM  Result Value Ref Range   Glucose-Capillary 142 (H) 65 - 99 mg/dL  Glucose, capillary     Status: Abnormal   Collection Time: 01/06/16  8:33 PM  Result Value Ref Range   Glucose-Capillary 128 (H) 65 - 99 mg/dL  Glucose, capillary     Status: Abnormal   Collection Time: 01/07/16  6:41 AM  Result Value Ref Range   Glucose-Capillary 120 (H) 65 - 99 mg/dL      General: No acute distress ENT- former trach site CDI, oral mucosa moistMood and affect are appropriate Head: craniectomy site without change--no swelling Heart: RRR, bigeminy Lungs: CTA Abdomen: +BS, soft nontender to palpation, nondistended, PEG site CDI Extremities: no edema Skin: no rash Neurologic: Cranial nerves II through XII intact, motor strength is 5/5 in RIght  deltoid, bicep, tricep, grip, hip flexor, knee extensors, ankle dorsiflexor and plantar flexor, 2-/5 Left hip flexion 2-/5 Left hip knee synergy. Trace resting tone left hamstring, heel cord tight. -0/5 LUE. Sensory exam normal sensation to light touch and proprioception in right upper and lower extremities  Musculoskeletal: Normal ROM all 4 extremities. No  joint swelling   Assessment/Plan: 1. Functional deficits secondary to RIght MCA infarct with left hemiplegia which require 3+ hours per day of interdisciplinary therapy in a comprehensive inpatient rehab setting. Physiatrist is providing close team supervision and 24 hour management of active medical problems listed below. Physiatrist and rehab team continue to assess barriers to discharge/monitor patient progress toward functional and medical goals. FIM: Function - Bathing Position: Sitting EOB Body parts bathed by patient: Left arm, Chest, Abdomen, Right upper leg, Left upper leg Body parts bathed by helper: Right arm, Right lower leg, Left lower leg Bathing not applicable: Front perineal  area, Buttocks (did not attempt this session) Assist Level:  (mod A)  Function- Upper Body Dressing/Undressing What is the patient wearing?: Pull over shirt/dress Pull over shirt/dress - Perfomed by patient: Thread/unthread right sleeve, Put head through opening Pull over shirt/dress - Perfomed by helper: Thread/unthread left sleeve, Pull shirt over trunk Assist Level:  (total a) Function - Lower Body Dressing/Undressing What is the patient wearing?: Pants Position: Bed Pants- Performed by helper: Thread/unthread right pants leg, Thread/unthread left pants leg, Pull pants up/down Non-skid slipper socks- Performed by helper: Don/doff right sock, Don/doff left sock Socks - Performed by helper: Don/doff right sock, Don/doff left sock Assist for footwear: Dependant Assist for lower body dressing:  (total)  Function - Toileting Toileting steps completed by helper: Adjust clothing prior to toileting, Performs perineal hygiene, Adjust clothing after toileting Toileting Assistive Devices: Other (comment) (lift) Assist level: Two helpers, Set up/obtain supplies, Supervision or verbal cues  Function - Toilet Transfers Toilet transfer assistive device: Mechanical lift Mechanical lift: Huntley DecSara Assist level to toilet: 2 helpers Assist level from toilet: 2 helpers  Function - Chair/bed transfer Chair/bed transfer method: Lateral scoot Chair/bed transfer assist level: Moderate assist (Pt 50 - 74%/lift or lower) Chair/bed transfer assistive device: Sliding board Mechanical lift: Huntley DecSara Chair/bed transfer details: Manual facilitation for placement, Verbal cues for safe use of DME/AE, Manual facilitation for weight shifting, Verbal cues for technique  Function - Locomotion: Wheelchair Will patient use wheelchair at discharge?: Yes Type: Manual Max wheelchair distance: 2850ft Assist Level: Maximal assistance (Pt 25 - 49%) Assist Level: Maximal assistance (Pt 25 - 49%) Wheel 150 feet activity did not  occur: Safety/medical concerns Turns around,maneuvers to table,bed, and toilet,negotiates 3% grade,maneuvers on rugs and over doorsills: No Function - Locomotion: Ambulation Ambulation activity did not occur: Safety/medical concerns Assistive device: Hand held assist Max distance: 4 ft Assist level: 2 helpers Walk 10 feet activity did not occur: Safety/medical concerns Walk 50 feet with 2 turns activity did not occur: Safety/medical concerns Walk 150 feet activity did not occur: Safety/medical concerns Walk 10 feet on uneven surfaces activity did not occur: Safety/medical concerns  Function - Comprehension Comprehension: Auditory Comprehension assist level: Understands basic 90% of the time/cues < 10% of the time  Function - Expression Expression: Verbal Expression assist level: Expresses basic 90% of the time/requires cueing < 10% of the time.  Function - Social Interaction Social Interaction assist level: Interacts appropriately 75 - 89% of the time - Needs redirection for appropriate language or to initiate interaction.  Function - Problem Solving Problem solving assist level: Solves basic 50 - 74% of the time/requires cueing 25 - 49% of the time  Function - Memory Memory assist level: Recognizes or recalls 50 - 74% of the time/requires cueing 25 - 49% of the time Patient normally able to recall (first 3 days only): Current season, Staff names and faces, That he or  she is in a hospital  Medical Problem List and Plan: 1.  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  .             -CIR, PT, OT SLP,   -improved LLE hip flexion 2.  DVT Prophylaxis/Anticoagulation: Subcutaneous heparin for DVT prophylaxis 3. Pain Management/chronic back pain: Oxycodone 5 mg every 6 hours as needed  -added baclofen prn for cramps/spasms 4. Mood: Prozac 40 mg daily.  Abilify 5 mg daily 5. Neuropsych: This patient is capable of making decisions on his own  behalf.Poor attn: increase ritalin 10mg  BID 6. Skin/Wound Care: Routine skin checks 7. 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 Gastrostomy PEG tube 12/05/2015. Nocturnal tube feeds recently discontinued. Currently on dysphagia #2 nectar liquid. Good intake this am per RN, No upgrade after repeat MBS             -regular PEG maintenance, may d/c after 01/19/2016 10. Hypertension. Now hypotensive:  -dc Hydralazine 10 mg every 8 hours  -continue amlodipine 5 mg daily, lisinopril 30 mg daily with hold parameters 11. Diabetes mellitus. Lantus insulin 25 units daily at bedtime.  -reasonable control  -Intake is good  Check blood sugars before meals and at bedtime,             - CBG (last 3)   Recent Labs  01/06/16 1631 01/06/16 2033 01/07/16 0641  GLUCAP 142* 128* 120*     12. Hyperlipidemia. Lipitor 80 mg daily at bedtime 13.Tobacco abuse. Counseling as appropriate 14.  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  ?RLS- pt c/o symptoms BLE prior to CVA trial low dose requip LOS (Days) 11 A FACE TO FACE EVALUATION WAS PERFORMED  KIRSTEINS,ANDREW E 01/07/2016, 7:37 AM

## 2016-01-07 NOTE — Progress Notes (Signed)
Speech Language Pathology Daily Session Note  Patient Details  Name: James Holden MRN: [id number] Date of Birth: 04/15/1960  Today's Date: 01/07/2016 SLP Individual Time: 1100-1200 SLP Individual Time Calculation (min): 60 min   Short Term Goals:Week 2: SLP Short Term Goal 1 (Week 2): Patient will consume trials of thin liquids without overt s/s of aspiration with Min A verbal cues for use of small sips over 2 sessions to assess readiness for repeat MBS.  SLP Short Term Goal 2 (Week 2): Patient will consume current diet without overt s/s of aspiration with min A verbal cues for use of swallowing compensatory strategies.  SLP Short Term Goal 3 (Week 2): Patient will maintain eye contact at midline to conversation partner during functional conversation/task for ~30 seconds in 25% of opportunities  SLP Short Term Goal 4 (Week 2): Patient will perform visual scanning tasks to locate items at midline with mod A multimodal cues in 50% of opportunities.  SLP Short Term Goal 5 (Week 2): Pt will demonstrate basic problem solving in functional tasks with mod A.  Skilled Therapeutic Interventions: Pt was seen for skilled ST targeting goals for dysphagia and cognition.  Therapist utilized the dynavision to address sustained attention to tasks and visual scanning to the left of midline.  Pt initially required max assist multimodal cues to locate items between left and right visual fields; however, as task progressed therapist was able to fade cues to mod assist verbal cues for use of visual scanning techniques.   Therapist also facilitated the session with a trial meal tray of dys 3 textures and thin liquids to continue working towards diet progression.  Pt needed max assist multimodal cues to locate items on his meal tray but utilized swallowing precautions with supervision verbal cues.  Pt demonstrated no overt s/s of aspiration with solids or liquids and he was able to maintain appropriate rate and  portion control in a moderately distracting environment.  Pt was left in wheelchair at nursing station with quick release belt donned for safety.  Continue per current plan of care.       Function:  Eating Eating   Modified Consistency Diet: Yes Eating Assist Level: Supervision or verbal cues;Set up assist for   Eating Set Up Assist For: Opening containers       Cognition Comprehension Comprehension assist level: Understands basic 90% of the time/cues < 10% of the time  Expression   Expression assist level: Expresses basic 90% of the time/requires cueing < 10% of the time.  Social Interaction Social Interaction assist level: Interacts appropriately 75 - 89% of the time - Needs redirection for appropriate language or to initiate interaction.  Problem Solving Problem solving assist level: Solves basic 25 - 49% of the time - needs direction more than half the time to initiate, plan or complete simple activities  Memory Memory assist level: Recognizes or recalls 50 - 74% of the time/requires cueing 25 - 49% of the time    Pain Pain Assessment Pain Assessment: No/denies pain Pain Score: 0-No pain Faces Pain Scale: Hurts little more Pain Type: Acute pain Pain Descriptors / Indicators: Headache Pain Intervention(s): Medication (See eMAR)  Therapy/Group: Individual Therapy  Tiauna Whisnant, Melanee SpryNicole L 01/07/2016, 1:36 PM

## 2016-01-07 NOTE — Progress Notes (Signed)
Physical Therapy Session Note  Patient Details  Name: James Holden MRN: [id number] Date of Birth: 08/05/1960  Today's Date: 01/07/2016 PT Individual Time: 1002-1059 PT Individual Time Calculation (min): 57 min    Skilled Therapeutic Interventions/Progress Updates:    Pt received in bed & agreeable to tx, noting 4/10 HA & B hip pain; RN administered medication during session. Pt performed rolling L/R with multimodal cuing and sequencing to allow PT to thread & don pants. Pt able to bridge with RLE & assist with pulling pants up. Pt required total assist for donning TED hose & shoes. Provided multimodal cuing for rolling supine>L sidelying>sitting EOB and provided max assist to transfer BLE to EOB & assist with transferring trunk to upright position. Pt with decreased ability to push to sitting with RUE. Provided total assist for w/c & slide board set up & pt completed lateral scoot bed>w/c on right with +2 assist for safety. Transported pt to gym via w/c total assist for time management. Pt ambulated 30 ft with rail in hallway & +2 assist (max assist + w/c follow for safety). Pt with manual facilitation for weight shifting and multimodal cuing for sequencing. Pt able to activate quads to initiate step with LLE and occasionally advance LLE with minimal assistance but therapist provided approximation & blocking at L knee to prevent buckling & hyperextension. Pt also required visual cuing for forward gaze. Supervised pt drinking thickened liquids throughout session with occasional cuing for small sips. At end of session pt left sitting in w/c in room with QRB in place & all needs within reach.   Throughout session therapist & rehab tech engaged in conversation with pt while standing on his L side to address pt's L inattention & impaired midline orientation.   Therapy Documentation Precautions:  Precautions Precautions: Fall Precaution Comments: L hemiplegia, R hemicraniotomy  Required Braces or  Orthoses: Other Brace/Splint Other Brace/Splint: Helmet when OOB Restrictions Weight Bearing Restrictions: No   See Function Navigator for Current Functional Status.   Therapy/Group: Individual Therapy  Sandi MariscalVictoria M Benen Weida 01/07/2016, 11:26 AM

## 2016-01-07 NOTE — Progress Notes (Signed)
Occupational Therapy Session Note  Patient Details  Name: James Holden MRN: [id number] Date of Birth: 03/10/1960  Today's Date: 01/07/2016 OT Individual Time: 3142-7670 OT Individual Time Calculation (min): 44 min     Short Term Goals: Week 1:  OT Short Term Goal 1 (Week 1): Pt will attend to L side of body to wash L arm with Mod instructional cues OT Short Term Goal 1 - Progress (Week 1): Not met OT Short Term Goal 2 (Week 1): Pt will don shirt with max A using hemi techniques OT Short Term Goal 2 - Progress (Week 1): Met OT Short Term Goal 3 (Week 1): Pt will tolerate giv-more-sling on L UE when OOB OT Short Term Goal 3 - Progress (Week 1): Not met  Skilled Therapeutic Interventions/Progress Updates:    Pt completed toilet transfer and toileting during session.  Max assist stand pivot for transfer to the toilet using the grab bar for support.  He needed total assist +2 for toilet hygiene and for management of the brief and his pants.  Washed hands at the sink with max assist secondary to left hemiparesis.  Once sitting in the chair had him work on visual scanning to follow finger, tracking from left to right across midline.  Pt only able to maintain gaze slightly left of midline for a couple seconds then he would scan back to the right.  With head turn to the right as well.  Worked on saccades from right to left as well with increased time needed to locate finger on the left of midline.  Pt still needing max instructional cueing and at times max demonstrational cueing for head turn to the left.  Next had pt work on functional mobility using 3 muskateers technique with total +2 (pt 30%) for approximately 25 ft.  Pt left in wheelchair at end of session with safety belt in place and call button and phone on the right side.    Therapy Documentation Precautions:  Precautions Precautions: Fall Precaution Comments: L hemiplegia, R hemicraniotomy  Required Braces or Orthoses: Other  Brace/Splint Other Brace/Splint: Helmet when OOB Restrictions Weight Bearing Restrictions: No  Pain: Pain Assessment Pain Assessment: No/denies pain ADL: See Function Navigator for Current Functional Status.   Therapy/Group: Individual Therapy  Lukis Bunt OTR/L 01/07/2016, 3:55 PM

## 2016-01-08 ENCOUNTER — Inpatient Hospital Stay (HOSPITAL_COMMUNITY): Payer: BLUE CROSS/BLUE SHIELD | Admitting: Occupational Therapy

## 2016-01-08 ENCOUNTER — Inpatient Hospital Stay (HOSPITAL_COMMUNITY): Payer: BLUE CROSS/BLUE SHIELD | Admitting: Physical Therapy

## 2016-01-08 ENCOUNTER — Inpatient Hospital Stay (HOSPITAL_COMMUNITY): Payer: BLUE CROSS/BLUE SHIELD | Admitting: Speech Pathology

## 2016-01-08 LAB — GLUCOSE, CAPILLARY
GLUCOSE-CAPILLARY: 147 mg/dL — AB (ref 65–99)
GLUCOSE-CAPILLARY: 174 mg/dL — AB (ref 65–99)
GLUCOSE-CAPILLARY: 122 mg/dL — AB (ref 65–99)
Glucose-Capillary: 120 mg/dL — ABNORMAL HIGH (ref 65–99)

## 2016-01-08 MED ORDER — GLUCERNA SHAKE PO LIQD
237.0000 mL | Freq: Two times a day (BID) | ORAL | Status: DC
  Administered 2016-01-09 – 2016-01-26 (×20): 237 mL via ORAL

## 2016-01-08 NOTE — Progress Notes (Signed)
Occupational Therapy Session Note  Patient Details  Name: James Holden MRN: [id number] Date of Birth: 07/31/1960  Today's Date: 01/08/2016 OT Individual Time: 1300-1400 OT Individual Time Calculation (min): 60 min     Short Term Goals: Week 2:  OT Short Term Goal 1 (Week 2): Pt will maintain sitting balance min assist unsupported during selfcare tasks.  OT Short Term Goal 2 (Week 2): Pt will complete sit to stand for LB bathing and dressing with no more than mod assist level. OT Short Term Goal 3 (Week 2): Pt will complete UB dressing with min assist and mod demonstrational cueing  OT Short Term Goal 4 (Week 2): Pt will initiate and complete supine to sit on the left side with mod assist in preparation for selfcare tasks.  OT Short Term Goal 5 (Week 2): Pt will visually scan left of midline to locate grooming, bathing, or feeding items with no more than mod instructional cueing.    Skilled Therapeutic Interventions/Progress Updates:    Pt completed bathing and dressing during session.  Max assist for removal of TEDs, socks, and shoes.  Max assist as well for stand pivot transfers in and out of the shower to and from the wheelchair.  He was able to complete UB bathing in sitting with min assist.  Decreased active trunk control with frequent LOB to the left side when sitting unsupported and using the LUE for washing.  Max hand over hand assist for washing the RUE using the LUE.  Mod assist for standing to wash peri area as well.  Completed dressing with max assist for LB and mod assist for UB.  Pt still needing max instructional cueing to identify parts of clothing and follow hemi dressing techniques.  Encouraged visual scanning to the left for finding clothing, washcloths, towels ect.  Pt's wife present for session as well and discussed with her the need for visitors and family to encourage pt to scan left of midline by staying on his left side and having him scan visually to locate  individuals during conversation.  She voice understanding.  Pt left in wheelchair at end of session with call button in reach and safety belt in place.    Therapy Documentation Precautions:  Precautions Precautions: Fall Precaution Comments: L hemiplegia, R hemicraniotomy  Required Braces or Orthoses: Other Brace/Splint Other Brace/Splint: Helmet when OOB Restrictions Weight Bearing Restrictions: No  Pain: Pain Assessment Pain Assessment: No/denies pain ADL: See Function Navigator for Current Functional Status.   Therapy/Group: Individual Therapy  Misty Rago OTR/L 01/08/2016, 4:17 PM

## 2016-01-08 NOTE — Progress Notes (Signed)
Nutrition Follow-up  DOCUMENTATION CODES:   Not applicable  INTERVENTION:  Provide Glucerna Shake po BID, each supplement provides 220 kcal and 10 grams of protein  Provide nourishment snacks in between meals.  Provide free water flushes of 50 ml BID via PEG for tube maintenance care.  Encourage adequate PO intake.   NUTRITION DIAGNOSIS:   Increased nutrient needs related to acute illness as evidenced by estimated needs; ongoing  GOAL:   Patient will meet greater than or equal to 90% of their needs; progressed  MONITOR:   PO intake, Supplement acceptance, Labs, Weight trends, Skin, I & O's  REASON FOR ASSESSMENT:   Malnutrition Screening Tool    ASSESSMENT:   Pt admitted to CIR with Left hemiplegia and dysphagia secondary to right MCA infarct with craniotomy 11/15/2015.  Pt is currently on a dysphagia 3 diet with thin liquids. Pt reports having a good appetite with no other difficulties. Meal completion has been varied from 30-100%. Pt is agreeable to nutritional supplements to aid in adequate caloric and protein needs.    CBG's 120-170 mg/dL.   Diet Order:  DIET DYS 3 Room service appropriate? Yes; Fluid consistency: Thin  Skin:   (Incision on head right)  Last BM:  11/21  Height:   Ht Readings from Last 1 Encounters:  12/27/15 5\' 10"  (1.778 m)    Weight:   Wt Readings from Last 1 Encounters:  01/08/16 194 lb 0.1 oz (88 kg)    Ideal Body Weight:  75.5 kg  BMI:  Body mass index is 27.84 kg/m.  Estimated Nutritional Needs:   Kcal:  2100-2300  Protein:  100-115 grams  Fluid:  2.1-2.3 L  EDUCATION NEEDS:   Education needs addressed  Roslyn SmilingStephanie Alim Cattell, MS, RD, LDN Pager # [phone] After hours/ weekend pager # [phone]

## 2016-01-08 NOTE — Patient Care Conference (Signed)
Inpatient RehabilitationTeam Conference and Plan of Care Update Date: 01/08/2016   Time: 10:55 AM    Patient Name: James Holden      Medical Record Number: [id number]  Date of Birth: 07/16/1960 Sex: Male         Room/Bed: 4W26C/4W26C-01 Payor Info: Payor: BLUE CROSS BLUE SHIELD / Plan: BCBS OTHER / Product Type: *No Product type* /    Admitting Diagnosis: CVA With Craniotomy  Admit Date/Time:  12/27/2015  2:13 PM Admission Comments: No comment available   Primary Diagnosis:  <principal problem not specified> Principal Problem: <principal problem not specified>  Patient Active Problem List   Diagnosis Date Noted  . 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 Maryla MorrowAnkit Patel Social Worker Present: Dossie DerBecky Tinlee Navarrette, LCSW Nurse Present: Chana Bodeeborah Sharp, RN PT Present: Wanda Plumparoline Cook, Antonietta JewelPT;Austin Tucker, PT OT Present: Perrin MalteseJames McGuire, OT SLP Present: Jackalyn LombardNicole Page, SLP PPS Coordinator present : Tora DuckMarie Noel, RN, CRRN     Current Status/Progress Goal Weekly Team Focus  Medical   Left hemiplegia and dysphagia secondary to right MCA infarct with craniotomy 11/15/2015  Improve mobility, attention, HTN, DM, ?RLS  See above   Bowel/Bladder   continent of bowel and bladder with assistance from staff. Can be incont of bladder HS  decrease QHS incontinence, and manage bowel and bladder with min assist  encourage pt to hold urinal with minimal help from staff   Swallow/Nutrition/ Hydration   Upgraded to dys 3, thin liquids; supervision   supervision   toleration of upgrade, trials of advanced textures, use of swallowing precautions   ADL's   UB bathing with mod assist.  LB bathing with max assist sit to stand.  UB dressing is completed at mod assist with LB dressing at max assist.  He continues to demonstrate left neglect as well as moderate left hemiparesis.    min assist  overall  selfcare retraining, balance retraining, functional transfers, neuromuscular re-education, visual compensation.  pt/family education   Mobility   Mod-max assist bed mobility. Mod-max assist SB/squat pivot transfers. Ambulation with Rail in hall Max Assist +[facility]. Patient demonstrating improved awareness of L side with increased active motor control in the LLE.   Min-mod assist with transfers and bed mobility. Min-mod assist with WC mobility.   Attention to L. Initiation of gait. increased independence with WC mobility. orientation to midline.    Communication   not addressed this reporting period to focus on cognition and dysphagia   mod I   education and carryover of intelligibility strategies    Safety/Cognition/ Behavioral Observations  mod assist   min assist   continue to address left inattention sustained attention to task and awareness of deficits    Pain   headache and pain to left knee. PRN oxycodone, tylenol, baclofen   Maintain pain management with prn medication 3 or less  assess pain and medicate as needed   Skin   PEG tube inplace. Pink around site. brusing to abdomen. Pink blanchable sarcal area  free of skin breakdown min assist  assess skin q shift. encourage turning and pressure relief      *See Care Plan and progress notes for long and short-term goals.  Barriers to Discharge: Attention, HTN, DM, ?RLS    Possible Resolutions to Barriers:  Ritalin increased, optimize BP and DM meds, trial requip    Discharge Planning/Teaching Needs:  Family working on  ramp and care to be able to provide 24 hr care. Will come in when needed-wife and son working now      Team Discussion:  Progressing toward his goals of min assist level. Ambulated with PT and rail yesterday 30 ft with max assist. More alert after ritalin started by MD. Rosanne AshingUpgraded diet to Dys 3 thin liquids. Attention and awareness better in therapies. Severe left neglect and attention to left side,  continue to work on this.  Revisions to Treatment Plan:  None   Continued Need for Acute Rehabilitation Level of Care: The patient requires daily medical management by a physician with specialized training in physical medicine and rehabilitation for the following conditions: Daily direction of a multidisciplinary physical rehabilitation program to ensure safe treatment while eliciting the highest outcome that is of practical value to the patient.: Yes Daily medical management of patient stability for increased activity during participation in an intensive rehabilitation regime.: Yes Daily analysis of laboratory values and/or radiology reports with any subsequent need for medication adjustment of medical intervention for : Neurological problems;Blood pressure problems;Other  Lucy ChrisDupree, Eloni Darius G 01/08/2016, 3:26 PM

## 2016-01-08 NOTE — Progress Notes (Signed)
Social Work Patient ID: James Holden, male   DOB: 01/18/1961, 55 y.o.   MRN: [id number]  Spoke with wife and pt to discuss team conference progressing toward his goals of min assist level. He is having knee pain today could be form  Ambulating with PT yesterday. Making good progress in therapies, pt and wife are very pleased with this. Wife knew he would do well here, just needed a chance. She plans to be here Friday due to off to attend therapies with pt and see His progress since she was here last.

## 2016-01-08 NOTE — Progress Notes (Signed)
Speech Language Pathology Daily Session Note  Patient Details  Name: James Holden MRN: [id number] Date of Birth: 01/03/1961  Today's Date: 01/08/2016 SLP Individual Time: 0800-0900 SLP Individual Time Calculation (min): 60 min   Short Term Goals: Week 2: SLP Short Term Goal 1 (Week 2): Patient will consume trials of thin liquids without overt s/s of aspiration with Min A verbal cues for use of small sips over 2 sessions to assess readiness for repeat MBS.  SLP Short Term Goal 2 (Week 2): Patient will consume current diet without overt s/s of aspiration with min A verbal cues for use of swallowing compensatory strategies.  SLP Short Term Goal 3 (Week 2): Patient will maintain eye contact at midline to conversation partner during functional conversation/task for ~30 seconds in 25% of opportunities  SLP Short Term Goal 4 (Week 2): Patient will perform visual scanning tasks to locate items at midline with mod A multimodal cues in 50% of opportunities.  SLP Short Term Goal 5 (Week 2): Pt will demonstrate basic problem solving in functional tasks with mod A.  Skilled Therapeutic Interventions:  Pt was seen for skilled ST targeting cognitive and dysphagia goals.  Pt consumed dys 3 textures and thin liquids with x1 min assist verbal cue for use of lingual sweep to clear left sided buccal residue.  No overt s/s of aspiration evident with solids or liquids and pt demonstrated appropriate rate and portion control with POs.  Therapist also facilitated the session with a novel card game targeting visual scanning to the left of midline and sustained attention to task.  Pt was able to locate matches of cards from a field of 9 between left and right of the environment with mod assist verbal cues for use of scanning techniques.  Pt was able to sustain his attention to task for 10 minutes with min verbal cues for redirection.  Pt was left in bed with bed alarm set and call bell within reach.        Function:  Eating Eating   Modified Consistency Diet: Yes Eating Assist Level: Supervision or verbal cues;Set up assist for   Eating Set Up Assist For: Opening containers       Cognition Comprehension Comprehension assist level: Follows basic conversation/direction with extra time/assistive device  Expression   Expression assist level: Expresses basic needs/ideas: With extra time/assistive device  Social Interaction Social Interaction assist level: Interacts appropriately 90% of the time - Needs monitoring or encouragement for participation or interaction.  Problem Solving Problem solving assist level: Solves basic 50 - 74% of the time/requires cueing 25 - 49% of the time  Memory Memory assist level: Recognizes or recalls 50 - 74% of the time/requires cueing 25 - 49% of the time    Pain Pain Assessment Pain Assessment: No/denies pain  Therapy/Group: Individual Therapy  Hardin Hardenbrook, Melanee SpryNicole L 01/08/2016, 9:02 AM

## 2016-01-08 NOTE — Progress Notes (Signed)
Physical Therapy Session Note  Patient Details  Name: Janey GentaJarvis George Bethea MRN: [id number] Date of Birth: 12/10/1960  Today's Date: 01/08/2016 PT Individual Time: 0915-1027 PT Individual Time Calculation (min): 72 min    Short Term Goals: Week 2:  PT Short Term Goal 1 (Week 2): Patient will perform sitting balance consistently with Min assist  PT Short Term Goal 2 (Week 2): Patient will initiate gait training,  PT Short Term Goal 3 (Week 2): Patient will perform WC mobility with supervision Assist using Hemi technique  PT Short Term Goal 4 (Week 2): Patient will transfer to R with mod assist via squat pivot or SB  PT Short Term Goal 5 (Week 2): Patient will tolerate standing for up to 4 minutes.   Skilled Therapeutic Interventions/Progress Updates:     Patient received semirecumbent in bed and agreeable to PT.   PT assisted patient to don socks and Ted hose in supine with max assist for time management. Supine>sit through log roll to L with mod assist from PT with mod-max cues for improved use of DME and midline orientation.   [facility] assist. PT provided assist to place board as well as moderate cues for proper head/hips relationship.   [facility] with max assist for time management. .   [facility]<>[facility] using rail in hall for UE support. Gait training x 3 ft with Rail in hall and max assist from PT for LLE advancement. Patient reports increased dizziness requested to sit.  PT assist Orthostatic vitals. With increased symptoms from supine to sitting and sitting to standing. (see flow sheet for details)   Transfer to mat table with max assist squat pivot Max cues for proper UE placement and improved use of LLE to lift off WC seat. .   Supine NMR. Bridge x 8, SAQ x 8 BLE, AAROM SLR x 8, trunk rotation R and L x 5 each. Hip abduction x 10 BLE.    Block practice bed mobility. Including Rolling L and R as well as sit<>supine with  mod -min assist with with rolling and Mod assist with bed mobility.   SB transfer back to [facility] with max assist and max multimodal cues for technique and safety.   Returned to room and left sitting in [facility] with RN present.     Therapy Documentation Precautions:  Precautions Precautions: Fall Precaution Comments: L hemiplegia, R hemicraniotomy  Required Braces or Orthoses: Other Brace/Splint Other Brace/Splint: Helmet when OOB Restrictions Weight Bearing Restrictions: No General:   Vital Signs: Therapy Vitals Patient Position (if appropriate): Orthostatic Vitals   Pain: Pain Assessment Pain Assessment: No/denies pain Faces Pain Scale: Hurts a little bit Pain Type: Acute pain Pain Location: Knee Pain Orientation: Left Pain Descriptors / Indicators: Aching;Discomfort Patients Stated Pain Goal: 2 Pain Intervention(s): Medication (See eMAR)   See Function Navigator for Current Functional Status.   Therapy/Group: Individual Therapy  Golden Popustin E Marielena Harvell 01/08/2016, 12:59 PM

## 2016-01-08 NOTE — Progress Notes (Signed)
Subjective/Complaints: Still some spasms/leg movements at night---sl improved?   None currently this am  ROS: Pt denies fever, rash/itching, headache, blurred or double vision, nausea, vomiting, abdominal pain, diarrhea, chest pain, shortness of breath, palpitations, dysuria, dizziness,   bleeding, anxiety, or depression   Objective: Vital Signs: Blood pressure (!) 126/99, pulse (!) 42, temperature 99 F (37.2 C), temperature source Oral, resp. rate 18, height 5\' 10"  (1.778 m), weight 88 kg (194 lb 0.1 oz), SpO2 99 %. No results found. Results for orders placed or performed during the hospital encounter of 12/27/15 (from the past 72 hour(s))  Glucose, capillary     Status: Abnormal   Collection Time: 01/05/16 11:28 AM  Result Value Ref Range   Glucose-Capillary 130 (H) 65 - 99 mg/dL  Glucose, capillary     Status: Abnormal   Collection Time: 01/05/16  4:35 PM  Result Value Ref Range   Glucose-Capillary 121 (H) 65 - 99 mg/dL  Glucose, capillary     Status: Abnormal   Collection Time: 01/05/16  8:39 PM  Result Value Ref Range   Glucose-Capillary 160 (H) 65 - 99 mg/dL   Comment 1 Notify RN   Glucose, capillary     Status: Abnormal   Collection Time: 01/06/16  6:48 AM  Result Value Ref Range   Glucose-Capillary 147 (H) 65 - 99 mg/dL   Comment 1 Notify RN   Glucose, capillary     Status: Abnormal   Collection Time: 01/06/16 11:28 AM  Result Value Ref Range   Glucose-Capillary 170 (H) 65 - 99 mg/dL  Glucose, capillary     Status: Abnormal   Collection Time: 01/06/16  4:31 PM  Result Value Ref Range   Glucose-Capillary 142 (H) 65 - 99 mg/dL  Glucose, capillary     Status: Abnormal   Collection Time: 01/06/16  8:33 PM  Result Value Ref Range   Glucose-Capillary 128 (H) 65 - 99 mg/dL  Glucose, capillary     Status: Abnormal   Collection Time: 01/07/16  6:41 AM  Result Value Ref Range   Glucose-Capillary 120 (H) 65 - 99 mg/dL  Glucose, capillary     Status: Abnormal   Collection Time: 01/07/16 11:06 AM  Result Value Ref Range   Glucose-Capillary 161 (H) 65 - 99 mg/dL  Glucose, capillary     Status: Abnormal   Collection Time: 01/07/16  4:26 PM  Result Value Ref Range   Glucose-Capillary 108 (H) 65 - 99 mg/dL  Glucose, capillary     Status: Abnormal   Collection Time: 01/07/16  8:43 PM  Result Value Ref Range   Glucose-Capillary 181 (H) 65 - 99 mg/dL  Glucose, capillary     Status: Abnormal   Collection Time: 01/08/16  6:28 AM  Result Value Ref Range   Glucose-Capillary 120 (H) 65 - 99 mg/dL      General: comfortable ENT- former trach site CDI, oral mucosa moistMood and affect are appropriate Head: craniectomy site without change--no swelling Heart: RRR, bigeminy Lungs: CTA Abdomen: +BS, soft nontender to palpation, nondistended, PEG site CDI Extremities: no edema Skin: no rash Neurologic: Cranial nerves II through XII intact, motor strength is 5/5 in RIght  deltoid, bicep, tricep, grip, hip flexor, knee extensors, ankle dorsiflexor and plantar flexor, 2-/5 Left hip flexion 2-/5 Left hip knee synergy. Trace resting tone left hamstring, heel cord tight. -0/5 LUE. Sensory exam normal sensation to light touch and proprioception in right upper and lower extremities  Musculoskeletal: Normal ROM all 4 extremities.  No joint swelling   Assessment/Plan: 1. Functional deficits secondary to RIght MCA infarct with left hemiplegia which require 3+ hours per day of interdisciplinary therapy in a comprehensive inpatient rehab setting. Physiatrist is providing close team supervision and 24 hour management of active medical problems listed below. Physiatrist and rehab team continue to assess barriers to discharge/monitor patient progress toward functional and medical goals. FIM: Function - Bathing Position: Sitting EOB Body parts bathed by patient: Left arm, Chest, Abdomen, Right upper leg, Left upper leg Body parts bathed by helper: Right arm, Right lower  leg, Left lower leg Bathing not applicable: Front perineal area, Buttocks (did not attempt this session) Assist Level:  (mod A)  Function- Upper Body Dressing/Undressing What is the patient wearing?: Pull over shirt/dress Pull over shirt/dress - Perfomed by patient: Thread/unthread right sleeve, Put head through opening Pull over shirt/dress - Perfomed by helper: Thread/unthread left sleeve, Pull shirt over trunk Assist Level:  (total a) Function - Lower Body Dressing/Undressing What is the patient wearing?: Pants Position: Bed Pants- Performed by helper: Thread/unthread right pants leg, Thread/unthread left pants leg, Pull pants up/down Non-skid slipper socks- Performed by helper: Don/doff right sock, Don/doff left sock Socks - Performed by helper: Don/doff right sock, Don/doff left sock Assist for footwear: Dependant Assist for lower body dressing:  (total)  Function - Toileting Toileting steps completed by helper: Adjust clothing prior to toileting, Performs perineal hygiene, Adjust clothing after toileting Toileting Assistive Devices: Other (comment) (lift) Assist level: Two helpers  Function - ArchivistToilet Transfers Toilet transfer assistive device: Elevated toilet seat/BSC over toilet, Grab bar Mechanical lift: Sara Assist level to toilet: Maximal assist (Pt 25 - 49%/lift and lower) Assist level from toilet: Maximal assist (Pt 25 - 49%/lift and lower)  Function - Chair/bed transfer Chair/bed transfer method: Squat pivot Chair/bed transfer assist level: Maximal assist (Pt 25 - 49%/lift and lower) Chair/bed transfer assistive device: Sliding board Mechanical lift: Huntley DecSara Chair/bed transfer details: Verbal cues for technique, Tactile cues for weight shifting, Tactile cues for sequencing, Verbal cues for sequencing  Function - Locomotion: Wheelchair Will patient use wheelchair at discharge?: Yes Type: Manual Max wheelchair distance: 6250ft Assist Level: Maximal assistance (Pt 25 -  49%) Assist Level: Maximal assistance (Pt 25 - 49%) Wheel 150 feet activity did not occur: Safety/medical concerns Turns around,maneuvers to table,bed, and toilet,negotiates 3% grade,maneuvers on rugs and over doorsills: No Function - Locomotion: Ambulation Ambulation activity did not occur: Safety/medical concerns Assistive device: No device Max distance: 25 ft Assist level: 2 helpers Walk 10 feet activity did not occur: Safety/medical concerns Assist level: 2 helpers Walk 50 feet with 2 turns activity did not occur: Safety/medical concerns Walk 150 feet activity did not occur: Safety/medical concerns Walk 10 feet on uneven surfaces activity did not occur: Safety/medical concerns  Function - Comprehension Comprehension: Auditory Comprehension assist level: Follows basic conversation/direction with extra time/assistive device  Function - Expression Expression: Verbal Expression assist level: Expresses basic needs/ideas: With extra time/assistive device  Function - Social Interaction Social Interaction assist level: Interacts appropriately 90% of the time - Needs monitoring or encouragement for participation or interaction.  Function - Problem Solving Problem solving assist level: Solves basic 50 - 74% of the time/requires cueing 25 - 49% of the time  Function - Memory Memory assist level: Recognizes or recalls 50 - 74% of the time/requires cueing 25 - 49% of the time Patient normally able to recall (first 3 days only): Current season, Staff names and faces, That he or she  is in a hospital  Medical Problem List and Plan: 1.  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  .             -CIR, PT, OT SLP,     2.  DVT Prophylaxis/Anticoagulation: Subcutaneous heparin for DVT prophylaxis 3. Pain Management/chronic back pain: Oxycodone 5 mg every 6 hours as needed  -continue baclofen prn for cramps/spasms 4. Mood: Prozac 40 mg daily.   Abilify 5 mg daily 5. Neuropsych: This patient is capable of making decisions on his own behalf.Poor attn: increased ritalin 10mg  BID 6. Skin/Wound Care: Routine skin checks 7. 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 Gastrostomy PEG tube 12/05/2015. Nocturnal tube feeds recently discontinued. Currently on dysphagia #2 nectar liquid. Good intake this am per RN, No upgrade after repeat MBS             -regular PEG maintenance, may d/c after 01/19/2016 10. Hypertension. Now hypotensive:  -dc Hydralazine 10 mg every 8 hours  -continue amlodipine 5 mg daily, lisinopril 30 mg daily with hold parameters 11. Diabetes mellitus. Lantus insulin 25 units daily at bedtime.  -reasonable control  -Intake is good  Check blood sugars before meals and at bedtime,             - CBG (last 3)   Recent Labs  01/07/16 1626 01/07/16 2043 01/08/16 0628  GLUCAP 108* 181* 120*     12. Hyperlipidemia. Lipitor 80 mg daily at bedtime 13.Tobacco abuse. Counseling as appropriate 14.  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  ?RLS- pt c/o symptoms BLE prior to CVA   -low dose requip started  -also has prn baclofen as above   LOS (Days) 12 A FACE TO FACE EVALUATION WAS PERFORMED  SWARTZ,ZACHARY T 01/08/2016, 9:21 AM

## 2016-01-09 ENCOUNTER — Inpatient Hospital Stay (HOSPITAL_COMMUNITY): Payer: BLUE CROSS/BLUE SHIELD | Admitting: Speech Pathology

## 2016-01-09 LAB — GLUCOSE, CAPILLARY
GLUCOSE-CAPILLARY: 128 mg/dL — AB (ref 65–99)
GLUCOSE-CAPILLARY: 183 mg/dL — AB (ref 65–99)
Glucose-Capillary: 148 mg/dL — ABNORMAL HIGH (ref 65–99)
Glucose-Capillary: 101 mg/dL — ABNORMAL HIGH (ref 65–99)

## 2016-01-09 NOTE — Progress Notes (Signed)
Subjective/Complaints: No issues overnite  ROS: Pt denies fever, rash/itching, headache, blurred or double vision, nausea, vomiting, abdominal pain, diarrhea, chest pain, shortness of breath, palpitations, dysuria, dizziness,      Objective: Vital Signs: Blood pressure (!) 105/51, pulse (!) 40, temperature 98 F (36.7 C), temperature source Oral, resp. rate 18, height 5\' 10"  (1.778 m), weight 89.4 kg (197 lb 1.5 oz), SpO2 98 %. No results found. Results for orders placed or performed during the hospital encounter of 12/27/15 (from the past 72 hour(s))  Glucose, capillary     Status: Abnormal   Collection Time: 01/06/16 11:28 AM  Result Value Ref Range   Glucose-Capillary 170 (H) 65 - 99 mg/dL  Glucose, capillary     Status: Abnormal   Collection Time: 01/06/16  4:31 PM  Result Value Ref Range   Glucose-Capillary 142 (H) 65 - 99 mg/dL  Glucose, capillary     Status: Abnormal   Collection Time: 01/06/16  8:33 PM  Result Value Ref Range   Glucose-Capillary 128 (H) 65 - 99 mg/dL  Glucose, capillary     Status: Abnormal   Collection Time: 01/07/16  6:41 AM  Result Value Ref Range   Glucose-Capillary 120 (H) 65 - 99 mg/dL  Glucose, capillary     Status: Abnormal   Collection Time: 01/07/16 11:06 AM  Result Value Ref Range   Glucose-Capillary 161 (H) 65 - 99 mg/dL  Glucose, capillary     Status: Abnormal   Collection Time: 01/07/16  4:26 PM  Result Value Ref Range   Glucose-Capillary 108 (H) 65 - 99 mg/dL  Glucose, capillary     Status: Abnormal   Collection Time: 01/07/16  8:43 PM  Result Value Ref Range   Glucose-Capillary 181 (H) 65 - 99 mg/dL  Glucose, capillary     Status: Abnormal   Collection Time: 01/08/16  6:28 AM  Result Value Ref Range   Glucose-Capillary 120 (H) 65 - 99 mg/dL  Glucose, capillary     Status: Abnormal   Collection Time: 01/08/16 11:26 AM  Result Value Ref Range   Glucose-Capillary 147 (H) 65 - 99 mg/dL  Glucose, capillary     Status: Abnormal   Collection Time: 01/08/16  4:41 PM  Result Value Ref Range   Glucose-Capillary 174 (H) 65 - 99 mg/dL  Glucose, capillary     Status: Abnormal   Collection Time: 01/08/16  8:50 PM  Result Value Ref Range   Glucose-Capillary 122 (H) 65 - 99 mg/dL   Comment 1 Notify RN   Glucose, capillary     Status: Abnormal   Collection Time: 01/09/16  6:36 AM  Result Value Ref Range   Glucose-Capillary 128 (H) 65 - 99 mg/dL   Comment 1 Notify RN       General: comfortable ENT- former trach site CDI, oral mucosa moistMood and affect are appropriate Head: craniectomy site without change--no swelling Heart: RRR, bigeminy Lungs: CTA Abdomen: +BS, soft nontender to palpation, nondistended, PEG site CDI Extremities: no edema Skin: no rash Neurologic: Cranial nerves II through XII intact, motor strength is 5/5 in RIght  deltoid, bicep, tricep, grip, hip flexor, knee extensors, ankle dorsiflexor and plantar flexor, 2-/5 Left hip flexion 2-/5 Left hip knee synergy. Trace resting tone left hamstring, heel cord tight. -0/5 LUE. Sensory exam normal sensation to light touch and proprioception in right upper and lower extremities  Musculoskeletal: Normal ROM all 4 extremities. No joint swelling   Assessment/Plan: 1. Functional deficits secondary to RIght MCA  infarct with left hemiplegia which require 3+ hours per day of interdisciplinary therapy in a comprehensive inpatient rehab setting. Physiatrist is providing close team supervision and 24 hour management of active medical problems listed below. Physiatrist and rehab team continue to assess barriers to discharge/monitor patient progress toward functional and medical goals. FIM: Function - Bathing Position: Shower Body parts bathed by patient: Left arm, Chest, Abdomen, Front perineal area, Right upper leg, Left upper leg, Right lower leg Body parts bathed by helper: Left lower leg, Buttocks, Back Bathing not applicable: Front perineal area, Buttocks (did  not attempt this session) Assist Level:  (mod A)  Function- Upper Body Dressing/Undressing What is the patient wearing?: Pull over shirt/dress Pull over shirt/dress - Perfomed by patient: Thread/unthread right sleeve, Put head through opening Pull over shirt/dress - Perfomed by helper: Thread/unthread left sleeve, Pull shirt over trunk Assist Level:  (total a) Function - Lower Body Dressing/Undressing What is the patient wearing?: Pants, Socks Position: Wheelchair/chair at sink Pants- Performed by helper: Thread/unthread right pants leg, Thread/unthread left pants leg, Pull pants up/down Non-skid slipper socks- Performed by helper: Don/doff right sock, Don/doff left sock Socks - Performed by patient: Don/doff right sock, Don/doff left sock Socks - Performed by helper: Don/doff right sock, Don/doff left sock Assist for footwear: Dependant Assist for lower body dressing:  (total)  Function - Toileting Toileting steps completed by helper: Adjust clothing prior to toileting, Performs perineal hygiene, Adjust clothing after toileting Toileting Assistive Devices: Other (comment) (lift) Assist level: Two helpers  Function - ArchivistToilet Transfers Toilet transfer assistive device: Elevated toilet seat/BSC over toilet, Grab bar Mechanical lift: Huntley DecSara Assist level to toilet: Maximal assist (Pt 25 - 49%/lift and lower) Assist level from toilet: Maximal assist (Pt 25 - 49%/lift and lower)  Function - Chair/bed transfer Chair/bed transfer method: Lateral scoot Chair/bed transfer assist level: Moderate assist (Pt 50 - 74%/lift or lower) Chair/bed transfer assistive device: Sliding board Mechanical lift: Huntley DecSara Chair/bed transfer details: Verbal cues for technique, Tactile cues for weight shifting, Tactile cues for sequencing, Verbal cues for sequencing  Function - Locomotion: Wheelchair Will patient use wheelchair at discharge?: Yes Type: Manual Max wheelchair distance: 7150ft Assist Level: Maximal  assistance (Pt 25 - 49%) Assist Level: Maximal assistance (Pt 25 - 49%) Wheel 150 feet activity did not occur: Safety/medical concerns Turns around,maneuvers to table,bed, and toilet,negotiates 3% grade,maneuvers on rugs and over doorsills: No Function - Locomotion: Ambulation Ambulation activity did not occur: Safety/medical concerns Assistive device: Rail in hallway Max distance: 3 Assist level: Maximal assist (Pt 25 - 49%) Walk 10 feet activity did not occur: Safety/medical concerns Assist level: 2 helpers Walk 50 feet with 2 turns activity did not occur: Safety/medical concerns Walk 150 feet activity did not occur: Safety/medical concerns Walk 10 feet on uneven surfaces activity did not occur: Safety/medical concerns  Function - Comprehension Comprehension: Auditory Comprehension assist level: Follows basic conversation/direction with extra time/assistive device  Function - Expression Expression: Verbal Expression assist level: Expresses basic needs/ideas: With extra time/assistive device  Function - Social Interaction Social Interaction assist level: Interacts appropriately 90% of the time - Needs monitoring or encouragement for participation or interaction.  Function - Problem Solving Problem solving assist level: Solves basic 50 - 74% of the time/requires cueing 25 - 49% of the time  Function - Memory Memory assist level: Recognizes or recalls 50 - 74% of the time/requires cueing 25 - 49% of the time Patient normally able to recall (first 3 days only): Current season, Staff  names and faces, That he or she is in a hospital  Medical Problem List and Plan: 1.  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  .             -CIR, PT, OT SLP,     2.  DVT Prophylaxis/Anticoagulation: Subcutaneous heparin for DVT prophylaxis 3. Pain Management/chronic back pain: Oxycodone 5 mg every 6 hours as needed  -continue baclofen prn for  cramps/spasms 4. Mood: Prozac 40 mg daily.  Abilify 5 mg daily 5. Neuropsych: This patient is capable of making decisions on his own behalf.Poor attn: increased ritalin 10mg  BID 6. Skin/Wound Care: Routine skin checks 7. 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 Gastrostomy PEG tube 12/05/2015. Nocturnal tube feeds recently discontinued. Currently on dysphagia #2 nectar liquid. No signs of aspiration             -regular PEG maintenance, may d/c after 01/19/2016 10. Hypertension. Now hypotensive:  -dc Hydralazine 10 mg every 8 hours  -continue amlodipine 5 mg daily, lisinopril 30 mg daily with hold parameters 11. Diabetes mellitus. Lantus insulin 25 units daily at bedtime.  -reasonable control  -Intake is good  Check blood sugars before meals and at bedtime,             - CBG (last 3)   Recent Labs  01/08/16 1641 01/08/16 2050 01/09/16 0636  GLUCAP 174* 122* 128*     12. Hyperlipidemia. Lipitor 80 mg daily at bedtime 13.Tobacco abuse. Counseling as appropriate 14.  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  ?RLS- pt c/o symptoms BLE prior to CVA   -low dose requip started, no complaints of symptoms at night  -also has prn baclofen as above   LOS (Days) 13 A FACE TO FACE EVALUATION WAS PERFORMED  KIRSTEINS,ANDREW E 01/09/2016, 8:46 AM

## 2016-01-10 ENCOUNTER — Inpatient Hospital Stay (HOSPITAL_COMMUNITY): Payer: BLUE CROSS/BLUE SHIELD | Admitting: Occupational Therapy

## 2016-01-10 ENCOUNTER — Inpatient Hospital Stay (HOSPITAL_COMMUNITY): Payer: BLUE CROSS/BLUE SHIELD | Admitting: Speech Pathology

## 2016-01-10 ENCOUNTER — Inpatient Hospital Stay (HOSPITAL_COMMUNITY): Payer: BLUE CROSS/BLUE SHIELD | Admitting: Physical Therapy

## 2016-01-10 LAB — GLUCOSE, CAPILLARY
GLUCOSE-CAPILLARY: 129 mg/dL — AB (ref 65–99)
GLUCOSE-CAPILLARY: 191 mg/dL — AB (ref 65–99)
Glucose-Capillary: 162 mg/dL — ABNORMAL HIGH (ref 65–99)
Glucose-Capillary: 153 mg/dL — ABNORMAL HIGH (ref 65–99)

## 2016-01-10 NOTE — Progress Notes (Signed)
Physical Therapy Session Note  Patient Details  Name: Kaydon Husby MRN: [id number] Date of Birth: 02-May-1960  Today's Date: 01/10/2016 PT Individual Time: 7276-1848 PT Individual Time Calculation (min): 70 min    Short Term Goals: Week 2:  PT Short Term Goal 1 (Week 2): Patient will perform sitting balance consistently with Min assist  PT Short Term Goal 2 (Week 2): Patient will initiate gait training,  PT Short Term Goal 3 (Week 2): Patient will perform WC mobility with supervision Assist using Hemi technique  PT Short Term Goal 4 (Week 2): Patient will transfer to R with mod assist via squat pivot or SB  PT Short Term Goal 5 (Week 2): Patient will tolerate standing for up to 4 minutes.   Skilled Therapeutic Interventions/Progress Updates:     Pt received sitting in WC and agreeable to PT.   PT transported patient to rehab gym in [facility]. PT exchanged Ulice Dash fushion cushion for basic Ulice Dash cushion to allow improved foot contact with WC mobility. PT instructed patient in [facility] mobility with R hemi technique 143f, 157f 2008fwith min assist progressing to supervision Assist. Mod cues for improved attention to the L side as well as awareness of obstacles at midline. Cues also provided for proper technique with door way management.  Patient able to demonstrate L lateral gaze with eyes beyond midline on this day.   PT instructed patient in tranfer training with SB to L and R x 4 each with min assist to the R and Max assist progressing to mod assist to the L. Min cue to technique and sequencing to the R and mod-max cues for sequencing towards L. Manual facilitation of trunk for lateral closure to allow L lateral movement of trunk as facilitation of proper LE positioning to prevent lateral LOB.  Car transfer with max assist from PT with entry into car; cues as listed above for transfer technique.   Lateral scooting EOB with emphasis and neuromuscular re-education of trunk to allow improved pelvic  mobillity to the L. Awareness of L side of bed with head rotation and lateral visual scanning also instructed for success with lateral scooting.   [facility] returned to room and [facility] and all needs met.     Therapy Documentation Precautions:  Precautions Precautions: Fall Precaution Comments: L hemiplegia, R hemicraniotomy  Required Braces or Orthoses: Other Brace/Splint Other Brace/Splint: Helmet when OOB Restrictions Weight Bearing Restrictions: No General:   Vital Signs: Therapy Vitals Temp: 98.2 F (36.8 C) Temp Source: Oral Pulse Rate: (!) 44 Resp: 16 BP: (!) 124/51 Patient Position (if appropriate): Sitting Oxygen Therapy SpO2: 99 % O2 Device: Not Delivered   See Function Navigator for Current Functional Status.   Therapy/Group: Individual Therapy  AusLorie Phenix/24/2017, 3:13 PM

## 2016-01-10 NOTE — Progress Notes (Signed)
Subjective/Complaints: Pt remembered to ask me about some redness that the RN noted around G tube, no abd pain   ROS: Pt denies fever, rash/itching, headache, blurred or double vision, nausea, vomiting, , diarrhea, chest pain, shortness of breath, palpitations, dysuria,]   Objective: Vital Signs: Blood pressure 112/88, pulse (!) 48, temperature 98.1 F (36.7 C), temperature source Oral, resp. rate 16, height 5\' 10"  (1.778 m), weight 90 kg (198 lb 6.6 oz), SpO2 100 %. No results found. Results for orders placed or performed during the hospital encounter of 12/27/15 (from the past 72 hour(s))  Glucose, capillary     Status: Abnormal   Collection Time: 01/07/16 11:06 AM  Result Value Ref Range   Glucose-Capillary 161 (H) 65 - 99 mg/dL  Glucose, capillary     Status: Abnormal   Collection Time: 01/07/16  4:26 PM  Result Value Ref Range   Glucose-Capillary 108 (H) 65 - 99 mg/dL  Glucose, capillary     Status: Abnormal   Collection Time: 01/07/16  8:43 PM  Result Value Ref Range   Glucose-Capillary 181 (H) 65 - 99 mg/dL  Glucose, capillary     Status: Abnormal   Collection Time: 01/08/16  6:28 AM  Result Value Ref Range   Glucose-Capillary 120 (H) 65 - 99 mg/dL  Glucose, capillary     Status: Abnormal   Collection Time: 01/08/16 11:26 AM  Result Value Ref Range   Glucose-Capillary 147 (H) 65 - 99 mg/dL  Glucose, capillary     Status: Abnormal   Collection Time: 01/08/16  4:41 PM  Result Value Ref Range   Glucose-Capillary 174 (H) 65 - 99 mg/dL  Glucose, capillary     Status: Abnormal   Collection Time: 01/08/16  8:50 PM  Result Value Ref Range   Glucose-Capillary 122 (H) 65 - 99 mg/dL   Comment 1 Notify RN   Glucose, capillary     Status: Abnormal   Collection Time: 01/09/16  6:36 AM  Result Value Ref Range   Glucose-Capillary 128 (H) 65 - 99 mg/dL   Comment 1 Notify RN   Glucose, capillary     Status: Abnormal   Collection Time: 01/09/16 11:39 AM  Result Value Ref Range    Glucose-Capillary 183 (H) 65 - 99 mg/dL  Glucose, capillary     Status: Abnormal   Collection Time: 01/09/16  4:41 PM  Result Value Ref Range   Glucose-Capillary 148 (H) 65 - 99 mg/dL  Glucose, capillary     Status: Abnormal   Collection Time: 01/09/16  8:49 PM  Result Value Ref Range   Glucose-Capillary 101 (H) 65 - 99 mg/dL  Glucose, capillary     Status: Abnormal   Collection Time: 01/10/16  6:17 AM  Result Value Ref Range   Glucose-Capillary 129 (H) 65 - 99 mg/dL      General: comfortable ENT- former trach site CDI, oral mucosa moistMood and affect are appropriate Head: craniectomy site without change--no swelling Heart: RRR, bigeminy Lungs: CTA Abdomen: +BS, soft nontender to palpation, nondistended, PEG site CDI Extremities: no edema Skin: no rash Neurologic: Cranial nerves II through XII intact, motor strength is 5/5 in RIght  deltoid, bicep, tricep, grip, hip flexor, knee extensors, ankle dorsiflexor and plantar flexor, 2-/5 Left hip flexion 2-/5 Left hip knee synergy. Trace resting tone left hamstring, heel cord tight. -0/5 LUE. Sensory exam normal sensation to light touch and proprioception in right upper and lower extremities  Musculoskeletal: Normal ROM all 4 extremities. No joint  swelling   Assessment/Plan: 1. Functional deficits secondary to RIght MCA infarct with left hemiplegia which require 3+ hours per day of interdisciplinary therapy in a comprehensive inpatient rehab setting. Physiatrist is providing close team supervision and 24 hour management of active medical problems listed below. Physiatrist and rehab team continue to assess barriers to discharge/monitor patient progress toward functional and medical goals. FIM: Function - Bathing Position: Shower Body parts bathed by patient: Left arm, Chest, Abdomen, Front perineal area, Right upper leg, Left upper leg, Right lower leg Body parts bathed by helper: Left lower leg, Buttocks, Back Bathing not  applicable: Front perineal area, Buttocks (did not attempt this session) Assist Level:  (mod A)  Function- Upper Body Dressing/Undressing What is the patient wearing?: Pull over shirt/dress Pull over shirt/dress - Perfomed by patient: Thread/unthread right sleeve, Put head through opening Pull over shirt/dress - Perfomed by helper: Thread/unthread left sleeve, Pull shirt over trunk Assist Level:  (total a) Function - Lower Body Dressing/Undressing What is the patient wearing?: Pants, Socks Position: Wheelchair/chair at sink Pants- Performed by helper: Thread/unthread right pants leg, Thread/unthread left pants leg, Pull pants up/down Non-skid slipper socks- Performed by helper: Don/doff right sock, Don/doff left sock Socks - Performed by patient: Don/doff right sock, Don/doff left sock Socks - Performed by helper: Don/doff right sock, Don/doff left sock Assist for footwear: Dependant Assist for lower body dressing:  (total)  Function - Toileting Toileting steps completed by helper: Adjust clothing prior to toileting, Performs perineal hygiene, Adjust clothing after toileting Toileting Assistive Devices: Other (comment) (lift) Assist level: Two helpers  Function - ArchivistToilet Transfers Toilet transfer assistive device: Elevated toilet seat/BSC over toilet, Grab bar Mechanical lift: Huntley DecSara Assist level to toilet: Maximal assist (Pt 25 - 49%/lift and lower) Assist level from toilet: Maximal assist (Pt 25 - 49%/lift and lower)  Function - Chair/bed transfer Chair/bed transfer method: Lateral scoot Chair/bed transfer assist level: Moderate assist (Pt 50 - 74%/lift or lower) Chair/bed transfer assistive device: Sliding board Mechanical lift: Huntley DecSara Chair/bed transfer details: Verbal cues for technique, Tactile cues for weight shifting, Tactile cues for sequencing, Verbal cues for sequencing  Function - Locomotion: Wheelchair Will patient use wheelchair at discharge?: Yes Type: Manual Max  wheelchair distance: 6950ft Assist Level: Maximal assistance (Pt 25 - 49%) Assist Level: Maximal assistance (Pt 25 - 49%) Wheel 150 feet activity did not occur: Safety/medical concerns Turns around,maneuvers to table,bed, and toilet,negotiates 3% grade,maneuvers on rugs and over doorsills: No Function - Locomotion: Ambulation Ambulation activity did not occur: Safety/medical concerns Assistive device: Rail in hallway Max distance: 3 Assist level: Maximal assist (Pt 25 - 49%) Walk 10 feet activity did not occur: Safety/medical concerns Assist level: 2 helpers Walk 50 feet with 2 turns activity did not occur: Safety/medical concerns Walk 150 feet activity did not occur: Safety/medical concerns Walk 10 feet on uneven surfaces activity did not occur: Safety/medical concerns  Function - Comprehension Comprehension: Auditory Comprehension assist level: Understands basic 90% of the time/cues < 10% of the time  Function - Expression Expression: Verbal Expression assist level: Expresses basic 50 - 74% of the time/requires cueing 25 - 49% of the time. Needs to repeat parts of sentences.  Function - Social Interaction Social Interaction assist level: Interacts appropriately 75 - 89% of the time - Needs redirection for appropriate language or to initiate interaction.  Function - Problem Solving Problem solving assist level: Solves basic 25 - 49% of the time - needs direction more than half the time to initiate,  plan or complete simple activities  Function - Memory Memory assist level: Recognizes or recalls 50 - 74% of the time/requires cueing 25 - 49% of the time Patient normally able to recall (first 3 days only): Current season, Staff names and faces, That he or she is in a hospital  Medical Problem List and Plan: 1.  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  .             -CIR, PT, OT SLP,    Developing increasing tone, may need to adjust  baclofen if this increases 2.  DVT Prophylaxis/Anticoagulation: Subcutaneous heparin for DVT prophylaxis 3. Pain Management/chronic back pain: Oxycodone 5 mg every 6 hours as needed  -continue baclofen prn for cramps/spasms 4. Mood: Prozac 40 mg daily.  Abilify 5 mg daily 5. Neuropsych: This patient is capable of making decisions on his own behalf.Poor attn: increased ritalin 10mg  BID 6. Skin/Wound Care: Routine skin checks 7. 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 Gastrostomy PEG tube 12/05/2015. Nocturnal tube feeds recently discontinued. Currently on dysphagia #2 nectar liquid.PEG site with mild erythema Left side of stoma, adjusted bumper              -regular PEG maintenance, may d/c after 01/19/2016 10. Hypertension. Now hypotensive:  -dc Hydralazine 10 mg every 8 hours  -continue amlodipine 5 mg daily, lisinopril 30 mg daily with hold parameters Vitals:   01/09/16 1427 01/10/16 0511  BP: 114/79 112/88  Pulse: (!) 46 (!) 48  Resp: 18 16  Temp: 97.6 F (36.4 C) 98.1 F (36.7 C)   11. Diabetes mellitus. Lantus insulin 25 units daily at bedtime.  -reasonable control  -Intake is good  Check blood sugars before meals and at bedtime,             - CBG (last 3)   Recent Labs  01/09/16 1641 01/09/16 2049 01/10/16 0617  GLUCAP 148* 101* 129*     12. Hyperlipidemia. Lipitor 80 mg daily at bedtime 13.Tobacco abuse. Counseling as appropriate 14.  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  ?RLS- pt c/o symptoms BLE prior to CVA   -low dose requip started, no complaints of symptoms at night  -also has prn baclofen as above   LOS (Days)  14 A FACE TO FACE EVALUATION WAS PERFORMED  Paiten Boies E 01/10/2016, 8:16 AM

## 2016-01-10 NOTE — Progress Notes (Signed)
Occupational Therapy Weekly Progress Note  Patient Details  Name: James Holden MRN: [id number] Date of Birth: [birth year]  Beginning of progress report period: January 04, 2016 End of progress report period: January 10, 2016  Today's Date: 01/10/2016 OT Individual Time: 0902-1000 OT Individual Time Calculation (min): 58 min     Patient has met 4 of 4 short term goals.  Mr. Furukawa continues to make progress in OT treatments at this time.  He demonstrates improvement in functional mobility and for selfcare.  Min assist for UB bathing and dressing, with mod instructional cueing for following hemi dressing techniques.  He continues to exhibit visual processing dysfunction secondary to his left neglect and has trouble scanning to the left to identify parts of his clothing in order to begin with the hemiparetic left side.  He still demonstrates severe left UE hemiparesis with shoulder movement noted at only trace and no active movement in the fingers at this time.  Max hand over hand assistance for using the LUE as a stabilizer for bathing tasks as well.  Sitting balance continues to improve and he is able to sit unsupported with supervision and complete UB dressing with only min assist for balance.  He still maintains right head turn and right peripheral gaze but can scan across midline with AROM of eyes slightly across midline as well for periods of up to 5 seconds.  Based on current progress and need still for extensive assistance, recommend continued rehab at CIR level to achieve min assist goals.    Patient continues to demonstrate the following deficits: decreased balance, decreased LUE functional use and coordintaion, decreased awareness, left neglect, decreased strength, and therefore will continue to benefit from skilled OT intervention to enhance overall performance with BADL.  Patient progressing toward long term goals..  Continue plan of care.  OT Short Term Goals Week 3:  OT Short  Term Goal 1 (Week 3): Pt will visually scan left of midline to locate grooming, bathing, or feeding items with no more than mod instructional cueing.   OT Short Term Goal 2 (Week 3): Pt will transfer stand pivot to the walk-in shower with supervision.   OT Short Term Goal 3 (Week 3): Pt will complete LB dressing with mod assist to donn pullover pants only. OT Short Term Goal 3 - Progress (Week 3): Other (comment) OT Short Term Goal 4 (Week 3): Pt will complete toilet transfer with mod assist stand pivot to elevated toilet.   OT Short Term Goal 5 (Week 3): Pt will use the LUE as a stabilizer with selfcare tasks with max assist.    Skilled Therapeutic Interventions/Progress Updates:    Pt worked on dressing sitting EOB.  Max instructional cueing to scan left of midline to locate shirt and pants.  Also needing max assist for orientation of clothing and scanning left to identify parts of the shirt and pants in order to initiate dressing.  Mr. Delapena needed max assist for crossing the LLE over the right knee to begin dressing, but was unable to maintain it secondary to increased hip pain.  Max assist needed for all LB dressing with min assist for donning pullover shirt after assistance with orientation.  Sat on EOB with guitar in lap and worked on using the LUE with washcloth to wipe areas of the guitar.  Max hand over hand assistance needed with max instructional cueing to maintain visual attention to the left hand with movement.  Pt transferred to wheelchair to conclude session.  Pt left in wheelchair with call button and phone in reach.  Half lap tray in place.    Therapy Documentation Precautions:  Precautions Precautions: Fall Precaution Comments: L hemiplegia, R hemicraniotomy  Required Braces or Orthoses: Other Brace/Splint Other Brace/Splint: Helmet when OOB Restrictions Weight Bearing Restrictions: No  Pain: No report of pain  ADL: ADL ADL Comments: Please see functional navigator  See  Function Navigator for Current Functional Status.   Therapy/Group: Individual Therapy  Ridhima Golberg OTR/L 01/10/2016, 5:07 PM

## 2016-01-10 NOTE — Progress Notes (Signed)
Speech Language Pathology Weekly Progress and Session Note  Patient Details  Name: James Holden MRN: [id number] Date of Birth: 03/23/1960  Beginning of progress report period: January 03, 2016 End of progress report period: January 10, 2016  Today's Date: 01/10/2016 SLP Individual Time: 1100-1210 SLP Individual Time Calculation (min): 70 min   Short Term Goals: Week 2: SLP Short Term Goal 1 (Week 2): Patient will consume trials of thin liquids without overt s/s of aspiration with Min A verbal cues for use of small sips over 2 sessions to assess readiness for repeat MBS.  SLP Short Term Goal 1 - Progress (Week 2): Met SLP Short Term Goal 2 (Week 2): Patient will consume current diet without overt s/s of aspiration with min A verbal cues for use of swallowing compensatory strategies.  SLP Short Term Goal 2 - Progress (Week 2): Met SLP Short Term Goal 3 (Week 2): Patient will maintain eye contact at midline to conversation partner during functional conversation/task for ~30 seconds in 25% of opportunities  SLP Short Term Goal 3 - Progress (Week 2): Not met SLP Short Term Goal 4 (Week 2): Patient will perform visual scanning tasks to locate items at midline with mod A multimodal cues in 50% of opportunities.  SLP Short Term Goal 4 - Progress (Week 2): Met SLP Short Term Goal 5 (Week 2): Pt will demonstrate basic problem solving in functional tasks with mod A. SLP Short Term Goal 5 - Progress (Week 2): Not met    New Short Term Goals: Week 3: SLP Short Term Goal 1 (Week 3): Patient will consume current diet without overt s/s of aspiration with Mod I for use of swallowing compensatory strategies.  SLP Short Term Goal 2 (Week 3): Patient will demonstrate efficient mastication and oral clearance with trials of regular textures over 2 sessions prior to upgrade with supervision verbal cues.  SLP Short Term Goal 3 (Week 3): Patient will maintain eye contact at midline to conversation partner  during functional conversation/task for ~30 seconds in 25% of opportunities with Mod A verbal and question cues.  SLP Short Term Goal 4 (Week 3): Patient will perform visual scanning tasks to locate items at midline with min A multimodal cues in 50% of opportunities.  SLP Short Term Goal 5 (Week 3): Patient will demonstrate basic problem solving in functional tasks with mod A verbal and question cues.  Weekly Progress Updates: Patient has made functional gains and has met 3 of 5 STG's this reporting period due to improved swallowing and cognitive functioning. Currently, patient is consuming Dys. 3 textures with thin liquids without overt s/s of aspiration and requires supervision verbal cues for use of swallowing compensatory strategies. Patient requires overall Mod-Max A verbal cues to maintain eye contact with conversation partner at midline, visual scanning and functional problem solving. Patient also requires overall Min-Mod A verbal cues for intellectual awareness of impairments and recall of new information. Patient and family education is ongoing. Patient would benefit from continued skilled SLP intervention to maximize his swallowing and cognitive function and overall functional independence prior to discharge home.    Intensity: Minumum of 1-2 x/day, 30 to 90 minutes Frequency: 3 to 5 out of 7 days Duration/Length of Stay: 01/21/16 Treatment/Interventions: Cognitive remediation/compensation;Cueing hierarchy;Functional tasks;Patient/family education;Therapeutic Activities;Internal/external aids;Dysphagia/aspiration precaution training;Environmental controls;Speech/Language facilitation   Daily Session  Skilled Therapeutic Interventions: Skilled treatment session focused on cognitive and dysphagia goals. SLP facilitated session by providing more than a reasonable amount of time and Mod-Max A verbal  and visual cues for problem solving with a basic money management task. Patient also required  Mod-Max A verbal and visual cues for scanning to left field of environment throughout task. Patient consumed his lunch meal of Dys. 3 textures with thin liquids via straw without overt s/s of aspiration and initially required Min A verbal cues which faded to Mod I by end of session for use of small bites and to self-monitor and correct left anterior spillage and pocketing. Recommend patient continue current diet with full supervision. Patient handed off to NT at end of session. Continue with current plan of care.       Function:   Eating Eating   Modified Consistency Diet: Yes Eating Assist Level: Set up assist for;Supervision or verbal cues;Helper checks for pocketed food   Eating Set Up Assist For: Opening containers       Cognition Comprehension Comprehension assist level: Understands basic 90% of the time/cues < 10% of the time  Expression   Expression assist level: Expresses basic 75 - 89% of the time/requires cueing 10 - 24% of the time. Needs helper to occlude trach/needs to repeat words.  Social Interaction Social Interaction assist level: Interacts appropriately 75 - 89% of the time - Needs redirection for appropriate language or to initiate interaction.  Problem Solving Problem solving assist level: Solves basic 25 - 49% of the time - needs direction more than half the time to initiate, plan or complete simple activities  Memory Memory assist level: Recognizes or recalls 50 - 74% of the time/requires cueing 25 - 49% of the time   Pain Pain Assessment Pain Score: 0-No pain  Therapy/Group: Individual Therapy  James Holden 01/10/2016, 12:37 PM

## 2016-01-11 ENCOUNTER — Inpatient Hospital Stay (HOSPITAL_COMMUNITY): Payer: BLUE CROSS/BLUE SHIELD | Admitting: Occupational Therapy

## 2016-01-11 DIAGNOSIS — E119 Type 2 diabetes mellitus without complications: Secondary | ICD-10-CM

## 2016-01-11 DIAGNOSIS — I10 Essential (primary) hypertension: Secondary | ICD-10-CM

## 2016-01-11 DIAGNOSIS — R443 Hallucinations, unspecified: Secondary | ICD-10-CM

## 2016-01-11 DIAGNOSIS — F339 Major depressive disorder, recurrent, unspecified: Secondary | ICD-10-CM

## 2016-01-11 LAB — GLUCOSE, CAPILLARY
GLUCOSE-CAPILLARY: 119 mg/dL — AB (ref 65–99)
GLUCOSE-CAPILLARY: 137 mg/dL — AB (ref 65–99)
GLUCOSE-CAPILLARY: 175 mg/dL — AB (ref 65–99)
GLUCOSE-CAPILLARY: 137 mg/dL — AB (ref 65–99)

## 2016-01-11 NOTE — Progress Notes (Signed)
Occupational Therapy Session Note  Patient Details  Name: Janey GentaJarvis George Kawecki MRN: [id number] Date of Birth: 09/17/1960  Today's Date: 01/11/2016 OT Individual Time: 1000-1058 OT Individual Time Calculation (min): 58 min     Short Term Goals: Week 3:  OT Short Term Goal 1 (Week 3): Pt will visually scan left of midline to locate grooming, bathing, or feeding items with no more than mod instructional cueing.   OT Short Term Goal 2 (Week 3): Pt will transfer stand pivot to the walk-in shower with supervision.   OT Short Term Goal 3 (Week 3): Pt will complete LB dressing with mod assist to donn pullover pants only. OT Short Term Goal 3 - Progress (Week 3): Other (comment) OT Short Term Goal 4 (Week 3): Pt will complete toilet transfer with mod assist stand pivot to elevated toilet.   OT Short Term Goal 5 (Week 3): Pt will use the LUE as a stabilizer with selfcare tasks with max assist.   Skilled Therapeutic Interventions/Progress Updates:     Upon entering the room, pt supine in bed with c/o fatigue and L hip pain. Pt required coaxing for participation this session. Pt performed supine >sit with min A for trunk. Pt seated on EOB for bathing and UB dressing. Pt needing SBA - min A for dynamic sitting balance during functional task. Pt scanning to midline and L in order to obtain needed items with mod verbal cues. Pt performed hemiplegic dressing techniques with mod multimodal cues for technique. Pt returning to supine secondary to c/o hip pain. While in supine, OT assisted pt in donning LB clothing items with pt rolling L<> R with min A and use of bed rails.Slide board transfer to the R with therapist assisting for set up of equipment and min A for balance. Quick release belt donned and L UE lap tray placed. Pt sitting at RN station for safety at end of session.   Therapy Documentation Precautions:  Precautions Precautions: Fall Precaution Comments: L hemiplegia, R hemicraniotomy  Required  Braces or Orthoses: Other Brace/Splint Other Brace/Splint: Helmet when OOB Restrictions Weight Bearing Restrictions: No General:   Vital Signs: Therapy Vitals BP: (!) 130/54 Pain: Pain Assessment Pain Assessment: 0-10 Pain Score: 5  Pain Type: Acute pain Pain Location: Hip Pain Orientation: Right;Left Pain Descriptors / Indicators: Aching Pain Onset: Gradual Pain Intervention(s): Medication (See eMAR);Repositioned ADL: ADL ADL Comments: Please see functional navigator Exercises:   Other Treatments:    See Function Navigator for Current Functional Status.   Therapy/Group: Individual Therapy  Alen BleacherBradsher, Amillion Macchia P 01/11/2016, 12:29 PM

## 2016-01-11 NOTE — Progress Notes (Signed)
Subjective/Complaints: Pt laying in bed this AM.  He states he slept well, but notes that he has been having hallucinations, seeing his son and wife, who are not presents.  Nursing unaware.    ROS: +Hallucinations. Denies CP, SOB, N/V/D.   Objective: Vital Signs: Blood pressure (!) 130/54, pulse (!) 46, temperature 98 F (36.7 C), temperature source Oral, resp. rate 18, height 5\' 10"  (1.778 m), weight 90.2 kg (198 lb 13.7 oz), SpO2 100 %. No results found. Results for orders placed or performed during the hospital encounter of 12/27/15 (from the past 72 hour(s))  Glucose, capillary     Status: Abnormal   Collection Time: 01/08/16 11:26 AM  Result Value Ref Range   Glucose-Capillary 147 (H) 65 - 99 mg/dL  Glucose, capillary     Status: Abnormal   Collection Time: 01/08/16  4:41 PM  Result Value Ref Range   Glucose-Capillary 174 (H) 65 - 99 mg/dL  Glucose, capillary     Status: Abnormal   Collection Time: 01/08/16  8:50 PM  Result Value Ref Range   Glucose-Capillary 122 (H) 65 - 99 mg/dL   Comment 1 Notify RN   Glucose, capillary     Status: Abnormal   Collection Time: 01/09/16  6:36 AM  Result Value Ref Range   Glucose-Capillary 128 (H) 65 - 99 mg/dL   Comment 1 Notify RN   Glucose, capillary     Status: Abnormal   Collection Time: 01/09/16 11:39 AM  Result Value Ref Range   Glucose-Capillary 183 (H) 65 - 99 mg/dL  Glucose, capillary     Status: Abnormal   Collection Time: 01/09/16  4:41 PM  Result Value Ref Range   Glucose-Capillary 148 (H) 65 - 99 mg/dL  Glucose, capillary     Status: Abnormal   Collection Time: 01/09/16  8:49 PM  Result Value Ref Range   Glucose-Capillary 101 (H) 65 - 99 mg/dL  Glucose, capillary     Status: Abnormal   Collection Time: 01/10/16  6:17 AM  Result Value Ref Range   Glucose-Capillary 129 (H) 65 - 99 mg/dL  Glucose, capillary     Status: Abnormal   Collection Time: 01/10/16 11:56 AM  Result Value Ref Range   Glucose-Capillary 191 (H)  65 - 99 mg/dL  Glucose, capillary     Status: Abnormal   Collection Time: 01/10/16  4:37 PM  Result Value Ref Range   Glucose-Capillary 162 (H) 65 - 99 mg/dL  Glucose, capillary     Status: Abnormal   Collection Time: 01/10/16  8:51 PM  Result Value Ref Range   Glucose-Capillary 153 (H) 65 - 99 mg/dL  Glucose, capillary     Status: Abnormal   Collection Time: 01/11/16  6:44 AM  Result Value Ref Range   Glucose-Capillary 119 (H) 65 - 99 mg/dL     Physical Exam: General: NAD. Well-developed.  ENT- former trach site CDI,  Psych: Mood and affect are appropriate Head: craniectomy site without swelling Heart: Regular rate and rhythm. No JVD Lungs: CTA. Unlabored. Abdomen: +BS, soft. PEG site CDI Musc: no edema. No tenderness. Skin: no rash. Warm and dry. Neurologic:  Motor strength is 5/5 in Right  deltoid, bicep, tricep, grip, hip flexor, knee extensors, ankle dorsiflexor and plantar flexor 2-/5 Left hip flexion  LUE: 0/5  Sensation diminished to light touch LUE  Assessment/Plan: 1. Functional deficits secondary to RIght MCA infarct with left hemiplegia which require 3+ hours per day of interdisciplinary therapy in a comprehensive  inpatient rehab setting. Physiatrist is providing close team supervision and 24 hour management of active medical problems listed below. Physiatrist and rehab team continue to assess barriers to discharge/monitor patient progress toward functional and medical goals. FIM: Function - Bathing Position: Shower Body parts bathed by patient: Left arm, Chest, Abdomen, Front perineal area, Right upper leg, Left upper leg, Right lower leg Body parts bathed by helper: Left lower leg, Buttocks, Back Bathing not applicable: Front perineal area, Buttocks (did not attempt this session) Assist Level:  (mod A)  Function- Upper Body Dressing/Undressing What is the patient wearing?: Pull over shirt/dress Pull over shirt/dress - Perfomed by patient: Thread/unthread  right sleeve, Put head through opening, Pull shirt over trunk Pull over shirt/dress - Perfomed by helper: Thread/unthread left sleeve Assist Level:  (total a) Function - Lower Body Dressing/Undressing What is the patient wearing?: Pants, Socks Position: Wheelchair/chair at sink Pants- Performed by helper: Thread/unthread right pants leg, Thread/unthread left pants leg, Pull pants up/down Non-skid slipper socks- Performed by helper: Don/doff right sock, Don/doff left sock Socks - Performed by patient: Don/doff right sock, Don/doff left sock Socks - Performed by helper: Don/doff right sock, Don/doff left sock Assist for footwear: Dependant Assist for lower body dressing:  (total)  Function - Toileting Toileting steps completed by helper: Adjust clothing prior to toileting, Performs perineal hygiene, Adjust clothing after toileting Toileting Assistive Devices: Other (comment) (lift) Assist level: Two helpers  Function - ArchivistToilet Transfers Toilet transfer assistive device: Elevated toilet seat/BSC over toilet, Grab bar Mechanical lift: Sara Assist level to toilet: Maximal assist (Pt 25 - 49%/lift and lower) Assist level from toilet: Maximal assist (Pt 25 - 49%/lift and lower)  Function - Chair/bed transfer Chair/bed transfer method: Lateral scoot Chair/bed transfer assist level: Touching or steadying assistance (Pt > 75%) Chair/bed transfer assistive device: Sliding board, Armrests Mechanical lift: Huntley DecSara Chair/bed transfer details: Verbal cues for technique, Tactile cues for weight shifting, Tactile cues for sequencing, Verbal cues for sequencing  Function - Locomotion: Wheelchair Will patient use wheelchair at discharge?: Yes Type: Manual Max wheelchair distance: 21200ft  Assist Level: Supervision or verbal cues Assist Level: Supervision or verbal cues Wheel 150 feet activity did not occur: Safety/medical concerns Assist Level: Supervision or verbal cues Turns around,maneuvers to  table,bed, and toilet,negotiates 3% grade,maneuvers on rugs and over doorsills: No Function - Locomotion: Ambulation Ambulation activity did not occur: Safety/medical concerns Assistive device: Rail in hallway Max distance: 3 Assist level: Maximal assist (Pt 25 - 49%) Walk 10 feet activity did not occur: Safety/medical concerns Assist level: 2 helpers Walk 50 feet with 2 turns activity did not occur: Safety/medical concerns Walk 150 feet activity did not occur: Safety/medical concerns Walk 10 feet on uneven surfaces activity did not occur: Safety/medical concerns  Function - Comprehension Comprehension: Auditory Comprehension assist level: Understands basic 90% of the time/cues < 10% of the time  Function - Expression Expression: Verbal Expression assist level: Expresses basic 75 - 89% of the time/requires cueing 10 - 24% of the time. Needs helper to occlude trach/needs to repeat words.  Function - Social Interaction Social Interaction assist level: Interacts appropriately 75 - 89% of the time - Needs redirection for appropriate language or to initiate interaction.  Function - Problem Solving Problem solving assist level: Solves basic 25 - 49% of the time - needs direction more than half the time to initiate, plan or complete simple activities  Function - Memory Memory assist level: Recognizes or recalls 50 - 74% of the time/requires cueing  25 - 49% of the time Patient normally able to recall (first 3 days only): Current season, Staff names and faces, That he or she is in a hospital  Medical Problem List and Plan: 1.  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  .             -Cont CIR   Developing increasing tone, may need to adjust baclofen if this increases 2.  DVT Prophylaxis/Anticoagulation: Subcutaneous heparin for DVT prophylaxis 3. Pain Management/chronic back pain: Oxycodone 5 mg every 6 hours as needed  -continue baclofen prn  for cramps/spasms 4. Mood: Prozac 40 mg daily.  Abilify 5 mg daily 5. Neuropsych: This patient is capable of making decisions on his own behalf.Poor attn: increased ritalin 10mg  BID 6. Skin/Wound Care: Routine skin checks 7. 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 Gastrostomy PEG tube 12/05/2015. Nocturnal tube feeds recently discontinued. Currently on dysphagia #3/thin.              -regular PEG maintenance, may d/c after 01/19/2016 10. Hypertension.   -dced Hydralazine 10 mg every 8 hours  -continue amlodipine 5 mg daily, lisinopril 30 mg daily with hold parameters  Fairly controlled at present Vitals:   01/11/16 0445 01/11/16 0922  BP: (!) 122/58 (!) 130/54  Pulse: (!) 46   Resp: 18   Temp: 98 F (36.7 C)    11. Diabetes mellitus. Lantus insulin 25 units daily at bedtime.  -Intake is good  Check blood sugars before meals and at bedtime  Slightly labile, fairly controlled CBG (last 3)   Recent Labs  01/10/16 1637 01/10/16 2051 01/11/16 0644  GLUCAP 162* 153* 119*   12. Hyperlipidemia. Lipitor 80 mg daily at bedtime 13.Tobacco abuse. Counseling as appropriate 14.  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  ?RLS- pt c/o symptoms BLE prior to CVA   -low dose requip started, no complaints of symptoms at night  -also has prn baclofen as above 16. Hyponatremia  Na 131 on 11/13  Labs ordered for Monday 17. Hallucinations  Meds reviewed, Trazodone d/ced 11/25, several other possible contributors  Will need to monitor closely  LOS (Days) 15 A FACE TO FACE EVALUATION WAS PERFORMED  Ankit Karis JubaAnil Patel 01/11/2016, 11:20 AM

## 2016-01-12 ENCOUNTER — Inpatient Hospital Stay (HOSPITAL_COMMUNITY): Payer: BLUE CROSS/BLUE SHIELD | Admitting: Physical Therapy

## 2016-01-12 ENCOUNTER — Inpatient Hospital Stay (HOSPITAL_COMMUNITY): Payer: BLUE CROSS/BLUE SHIELD | Admitting: *Deleted

## 2016-01-12 ENCOUNTER — Inpatient Hospital Stay (HOSPITAL_COMMUNITY): Payer: BLUE CROSS/BLUE SHIELD | Admitting: Speech Pathology

## 2016-01-12 ENCOUNTER — Inpatient Hospital Stay (HOSPITAL_COMMUNITY): Payer: BLUE CROSS/BLUE SHIELD

## 2016-01-12 DIAGNOSIS — E871 Hypo-osmolality and hyponatremia: Secondary | ICD-10-CM

## 2016-01-12 LAB — GLUCOSE, CAPILLARY
GLUCOSE-CAPILLARY: 133 mg/dL — AB (ref 65–99)
GLUCOSE-CAPILLARY: 148 mg/dL — AB (ref 65–99)
GLUCOSE-CAPILLARY: 161 mg/dL — AB (ref 65–99)
Glucose-Capillary: 186 mg/dL — ABNORMAL HIGH (ref 65–99)

## 2016-01-12 MED ORDER — BISACODYL 10 MG RE SUPP
10.0000 mg | Freq: Every day | RECTAL | Status: DC | PRN
  Filled 2016-01-12: qty 1

## 2016-01-12 NOTE — Progress Notes (Signed)
Physical Therapy Session Note  Patient Details  Name: James Holden MRN: [id number] Date of Birth: 10/13/1960  Today's Date: 01/12/2016 PT Individual Time: 0900-1000 PT Individual Time Calculation (min): 60 min      Skilled Therapeutic Interventions/Progress Updates:  Patient in bed at the beginning of the session , agrees to therapy session. Bed mobility to don shorts-mod to max A, Ted hose applied, helmet on. Transfer to [name] via sliding board with min A.   [name] mobility training with hemi technique and cues for attention to L side.  Gait Training with R side rail and max A from therapist with manual assistance for L LE- patient shows initiated hip flexion, 25 feet completed with close [name] follow.  Transfer stand pivot to mat with max A, multiple sit to stand [name] use of hands, max to mod A and manual locking of L knee. In standing mini squats and weight shifting to increase weight acceptance on L side. Patient needs frequent rest breaks as he gets easily fatigued.   At the end of session patient returned  to room, QR belt on and all needs within reach.   Therapy Documentation Precautions:  Precautions Precautions: Fall Precaution Comments: L hemiplegia, R hemicraniotomy  Required Braces or Orthoses: Other Brace/Splint Other Brace/Splint: Helmet when OOB Restrictions Weight Bearing Restrictions: No   See Function Navigator for Current Functional Status.   Therapy/Group: Individual Therapy  Dorna MaiCzajkowska, James Holden 01/12/2016, 12:26 PM

## 2016-01-12 NOTE — Progress Notes (Signed)
Physical Therapy Weekly Progress Note  Patient Details  Name: James Holden MRN: [id number] Date of Birth: [birth year]  Beginning of progress report period: 01/04/16 End of progress report period: 01/12/16 Today's Date: 01/12/2016 PT Individual Time: 1345-1440 PT Individual Time Calculation (min): 55 min   Patient has met 3 of 5 short term goals.    Patient continues to demonstrate the following deficits: L neglect/ vision deficits, motor, balance, activity tolerance and therefore will continue to benefit from skilled PT intervention to enhance overall performance with activity tolerance, balance, postural control, ability to compensate for deficits, functional use of  left upper extremity and left lower extremity and awareness.  Patient progressing toward long term goals..  Continue plan of care.  PT Short Term Goals  Week 2:  PT Short Term Goal 1 (Week 2): Patient will perform sitting balance consistently with Min assist  PT Short Term Goal 1 - Progress (Week 2): Met PT Short Term Goal 2 (Week 2): Patient will initiate gait training,  PT Short Term Goal 2 - Progress (Week 2): Partly met PT Short Term Goal 3 (Week 2): Patient will perform WC mobility with supervision Assist using Hemi technique  PT Short Term Goal 3 - Progress (Week 2): Met PT Short Term Goal 4 (Week 2): Patient will transfer to R with mod assist via squat pivot or SB  PT Short Term Goal 4 - Progress (Week 2): Met PT Short Term Goal 5 (Week 2): Patient will tolerate standing for up to 4 minutes.  PT Short Term Goal 5 - Progress (Week 2): Not met Week 3:      Skilled Therapeutic Interventions/Progress Updates: wife and son observed tx.  Pt c/o buttocks and coccyx pain from sitting up in w/c earlier.  W/c cushion found to be in chair backwards.  PT educated pt on design and function of cushion, and placed gel cushion in recliner w/c.Gentle PROM/ grade I mobilizations bil hips for increasing joint lubrication.  neuromuscular re-education via multimodal cues for L mass flexion/extenion in supine, x 10.   Rolling L> sitting EOB with mod assist, to L side of bed.  Squat pivot transfers to R with mod assist, to L +2/   Therapeutic activities in sitting  to promote L attention included using Zenia Resides wrench to adjust w/c footrests held in lap to L, reaching for cards with R hand facilitating trunk shortening/lengthening/rotating.  Pt stood x 2 with mod/max assist to come to stand, +2 assist.  Pt tolerated standing x 20-30 seconds;  bil LEs quivering with fatigue.  Pt passed to [place], SLP for next therapy.     Therapy Documentation Precautions:  Precautions Precautions: Fall Precaution Comments: L hemiplegia, R hemicraniotomy  Required Braces or Orthoses: Other Brace/Splint Other Brace/Splint: Helmet when OOB Restrictions Weight Bearing Restrictions: No   Pain: Pain Assessment Pain Assessment: No/denies pain   Other Treatments: Treatments Neuromuscular Facilitation: Left;Lower Extremity;Upper Extremity;Forced use;Activity to increase timing and sequencing;Activity to increase sustained activation;Activity to increase anterior-posterior weight shifting;Activity to increase lateral weight shifting;Limitations Weight Bearing Technique Weight Bearing Technique: Yes LUE Weight Bearing Technique: Forearm seated   See Function Navigator for Current Functional Status.  Therapy/Group: Individual Therapy  Merril Isakson 01/12/2016, 5:00 PM

## 2016-01-12 NOTE — Progress Notes (Signed)
Physical Therapy Session Note  Patient Details  Name: James Holden MRN: [id number] Date of Birth: 02/23/1960  Today's Date: 01/12/2016 PT Individual Time: 1106-1201 PT Individual Time Calculation (min): 55 min    Short Term Goals: Week 2:  PT Short Term Goal 1 (Week 2): Patient will perform sitting balance consistently with Min assist  PT Short Term Goal 2 (Week 2): Patient will initiate gait training,  PT Short Term Goal 3 (Week 2): Patient will perform WC mobility with supervision Assist using Hemi technique  PT Short Term Goal 4 (Week 2): Patient will transfer to R with mod assist via squat pivot or SB  PT Short Term Goal 5 (Week 2): Patient will tolerate standing for up to 4 minutes.   Skilled Therapeutic Interventions/Progress Updates:    Pt received in w/c & agreeable to tx. Pt noted 7/10 pain in B hips & later noted pain in L shoulder; RN administered pain medication prior to PT arrival. Educated pt on potential of subluxation in L shoulder; pt may benefit from taping in L shoulder. Pt propelled w/c x 80 ft with R hemi technique, supervision, and max cuing for linear path as pt is unable to avoid obstacles on L 2/2 L neglect. Utilized dynavision from w/c level with focus on midline orientation & L neglect. Pt with significantly long reaction times to lights on L & requires max cuing to turn head & scan L. Pt with slightly improving ability to scan with repetition & max cuing. Pt performed car transfer (w/c<>car) set at level surface with w/c with total assist for w/c positioning & slide board set up. Educated pt on head/hips relationship and pt able to transfer to L into car with mod assist, then to R back to w/c with min assist. Pt required max multimodal cuing to perform task with head/hips relationship. At end of session pt left sitting in w/c at nurses station with QRB donned.  Throughout session pt noted significant fatigue following therapy sessions earlier this morning; pt  required frequent rest breaks as a result.   Therapy Documentation Precautions:  Precautions Precautions: Fall Precaution Comments: L hemiplegia, R hemicraniotomy  Required Braces or Orthoses: Other Brace/Splint Other Brace/Splint: Helmet when OOB Restrictions Weight Bearing Restrictions: No   See Function Navigator for Current Functional Status.   Therapy/Group: Individual Therapy  Sandi MariscalVictoria M Rollins Wrightson 01/12/2016, 12:06 PM

## 2016-01-12 NOTE — Progress Notes (Signed)
Patient refusing suppository at this time. Patient educated on need for laxatives and LBM at 11/21. Patient reports may try later.

## 2016-01-12 NOTE — Progress Notes (Signed)
Subjective/Complaints: Pt laying in bed this AM this AM.  He slept well.  No notes improvement in pain.  He also notes improvement in hallucinations.   ROS: Denies CP, SOB, N/V/D.   Objective: Vital Signs: Blood pressure (!) 110/47, pulse (!) 44, temperature 97.5 F (36.4 C), temperature source Oral, resp. rate 18, height 5\' 10"  (1.778 m), weight 89.1 kg (196 lb 6.4 oz), SpO2 98 %. No results found. Results for orders placed or performed during the hospital encounter of 12/27/15 (from the past 72 hour(s))  Glucose, capillary     Status: Abnormal   Collection Time: 01/09/16  4:41 PM  Result Value Ref Range   Glucose-Capillary 148 (H) 65 - 99 mg/dL  Glucose, capillary     Status: Abnormal   Collection Time: 01/09/16  8:49 PM  Result Value Ref Range   Glucose-Capillary 101 (H) 65 - 99 mg/dL  Glucose, capillary     Status: Abnormal   Collection Time: 01/10/16  6:17 AM  Result Value Ref Range   Glucose-Capillary 129 (H) 65 - 99 mg/dL  Glucose, capillary     Status: Abnormal   Collection Time: 01/10/16 11:56 AM  Result Value Ref Range   Glucose-Capillary 191 (H) 65 - 99 mg/dL  Glucose, capillary     Status: Abnormal   Collection Time: 01/10/16  4:37 PM  Result Value Ref Range   Glucose-Capillary 162 (H) 65 - 99 mg/dL  Glucose, capillary     Status: Abnormal   Collection Time: 01/10/16  8:51 PM  Result Value Ref Range   Glucose-Capillary 153 (H) 65 - 99 mg/dL  Glucose, capillary     Status: Abnormal   Collection Time: 01/11/16  6:44 AM  Result Value Ref Range   Glucose-Capillary 119 (H) 65 - 99 mg/dL  Glucose, capillary     Status: Abnormal   Collection Time: 01/11/16 11:19 AM  Result Value Ref Range   Glucose-Capillary 137 (H) 65 - 99 mg/dL  Glucose, capillary     Status: Abnormal   Collection Time: 01/11/16  4:32 PM  Result Value Ref Range   Glucose-Capillary 175 (H) 65 - 99 mg/dL  Glucose, capillary     Status: Abnormal   Collection Time: 01/11/16  8:57 PM  Result  Value Ref Range   Glucose-Capillary 137 (H) 65 - 99 mg/dL  Glucose, capillary     Status: Abnormal   Collection Time: 01/12/16  6:54 AM  Result Value Ref Range   Glucose-Capillary 133 (H) 65 - 99 mg/dL  Glucose, capillary     Status: Abnormal   Collection Time: 01/12/16 12:05 PM  Result Value Ref Range   Glucose-Capillary 148 (H) 65 - 99 mg/dL     Physical Exam: General: NAD. Well-developed.  ENT- former trach site CDI,  Psych: Mood and affect are appropriate Head: craniectomy site without swelling Heart: RRR. No JVD Lungs: CTA. Unlabored. Abdomen: +BS, soft. PEG site CDI Musc: no edema. No tenderness. Skin: no rash. Warm and dry. Neurologic:  Motor strength is 5/5 in Right  deltoid, bicep, tricep, grip, hip flexor, knee extensors, ankle dorsiflexor and plantar flexor 2-/5 Left hip flexion  LUE: 0/5  Sensation diminished to light touch LUE  Assessment/Plan: 1. Functional deficits secondary to RIght MCA infarct with left hemiplegia which require 3+ hours per day of interdisciplinary therapy in a comprehensive inpatient rehab setting. Physiatrist is providing close team supervision and 24 hour management of active medical problems listed below. Physiatrist and rehab team continue to assess  barriers to discharge/monitor patient progress toward functional and medical goals. FIM: Function - Bathing Position: Sitting EOB Body parts bathed by patient: Left arm, Chest, Abdomen, Front perineal area, Right upper leg, Left upper leg, Right lower leg Body parts bathed by helper: Left lower leg, Back, Right arm Bathing not applicable: Front perineal area, Buttocks (did not attempt this session) Assist Level: Touching or steadying assistance(Pt > 75%)  Function- Upper Body Dressing/Undressing What is the patient wearing?: Pull over shirt/dress Pull over shirt/dress - Perfomed by patient: Thread/unthread right sleeve, Put head through opening, Pull shirt over trunk Pull over shirt/dress -  Perfomed by helper: Thread/unthread left sleeve Assist Level: Touching or steadying assistance(Pt > 75%) Function - Lower Body Dressing/Undressing What is the patient wearing?: Pants, Ted Hose, Shoes Position: Bed Pants- Performed by helper: Thread/unthread right pants leg, Thread/unthread left pants leg, Pull pants up/down Non-skid slipper socks- Performed by helper: Don/doff right sock, Don/doff left sock Socks - Performed by patient: Don/doff right sock, Don/doff left sock Socks - Performed by helper: Don/doff right sock, Don/doff left sock Shoes - Performed by helper: Don/doff right shoe, Don/doff left shoe, Fasten right, Fasten left TED Hose - Performed by helper: Don/doff right TED hose, Don/doff left TED hose Assist for footwear: Dependant Assist for lower body dressing:  (total)  Function - Toileting Toileting activity did not occur: Safety/medical concerns Toileting steps completed by helper: Adjust clothing prior to toileting, Performs perineal hygiene, Adjust clothing after toileting Toileting Assistive Devices: Other (comment) (lift) Assist level: Two helpers  Function - ArchivistToilet Transfers Toilet transfer activity did not occur: Safety/medical concerns Toilet transfer assistive device: Systems developerMechanical lift Mechanical lift: Forensic scientistara Assist level to toilet: 2 helpers Assist level from toilet: 2 helpers  Function - Chair/bed transfer Chair/bed transfer method: Lateral scoot Chair/bed transfer assist level: Touching or steadying assistance (Pt > 75%) Chair/bed transfer assistive device: Sliding board, Armrests Mechanical lift: Huntley DecSara Chair/bed transfer details: Verbal cues for technique, Tactile cues for weight shifting, Tactile cues for sequencing, Verbal cues for sequencing  Function - Locomotion: Wheelchair Will patient use wheelchair at discharge?: Yes Type: Manual Max wheelchair distance: 80 ft Assist Level: Supervision or verbal cues Assist Level: Supervision or verbal  cues Wheel 150 feet activity did not occur: Safety/medical concerns Assist Level: Supervision or verbal cues Turns around,maneuvers to table,bed, and toilet,negotiates 3% grade,maneuvers on rugs and over doorsills: No Function - Locomotion: Ambulation Ambulation activity did not occur: Safety/medical concerns Assistive device: Rail in hallway Max distance: 3 Assist level: Maximal assist (Pt 25 - 49%) Walk 10 feet activity did not occur: Safety/medical concerns Assist level: 2 helpers Walk 50 feet with 2 turns activity did not occur: Safety/medical concerns Walk 150 feet activity did not occur: Safety/medical concerns Walk 10 feet on uneven surfaces activity did not occur: Safety/medical concerns  Function - Comprehension Comprehension: Auditory Comprehension assist level: Understands basic 90% of the time/cues < 10% of the time  Function - Expression Expression: Verbal Expression assist level: Expresses basic 75 - 89% of the time/requires cueing 10 - 24% of the time. Needs helper to occlude trach/needs to repeat words.  Function - Social Interaction Social Interaction assist level: Interacts appropriately 75 - 89% of the time - Needs redirection for appropriate language or to initiate interaction.  Function - Problem Solving Problem solving assist level: Solves basic 25 - 49% of the time - needs direction more than half the time to initiate, plan or complete simple activities  Function - Memory Memory assist level: Recognizes  or recalls 50 - 74% of the time/requires cueing 25 - 49% of the time Patient normally able to recall (first 3 days only): Current season, Staff names and faces, That he or she is in a hospital  Medical Problem List and Plan: 1.  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  .             -Cont CIR   Developing increasing tone, may need to adjust baclofen if this increases 2.  DVT Prophylaxis/Anticoagulation:  Subcutaneous heparin for DVT prophylaxis 3. Pain Management/chronic back pain: Oxycodone 5 mg every 6 hours as needed  -continue baclofen prn for cramps/spasms 4. Mood: Prozac 40 mg daily.  Abilify 5 mg daily 5. Neuropsych: This patient is capable of making decisions on his own behalf.Poor attn: increased ritalin 10mg  BID 6. Skin/Wound Care: Routine skin checks 7. 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 Gastrostomy PEG tube 12/05/2015. Nocturnal tube feeds recently discontinued. Currently on dysphagia #3/thin.              -regular PEG maintenance, may d/c after 01/19/2016 10. Hypertension.   -dced Hydralazine 10 mg every 8 hours  -continue amlodipine 5 mg daily, lisinopril 30 mg daily with hold parameters  Fairly controlled 11/26 Vitals:   01/12/16 0431 01/12/16 1330  BP: 111/61 (!) 110/47  Pulse: (!) 40 (!) 44  Resp: 18 18  Temp: 97.7 F (36.5 C) 97.5 F (36.4 C)   11. Diabetes mellitus. Lantus insulin 25 units daily at bedtime.  Intake is good  Check blood sugars before meals and at bedtime  Relatively controlled 11/26 CBG (last 3)   Recent Labs  01/11/16 2057 01/12/16 0654 01/12/16 1205  GLUCAP 137* 133* 148*   12. Hyperlipidemia. Lipitor 80 mg daily at bedtime 13.Tobacco abuse. Counseling as appropriate 14.  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 ?RLS- pt c/o symptoms BLE prior to CVA   -low dose requip started, no complaints of symptoms at night  -also has prn baclofen as above 16. Hyponatremia  Na 131 on 11/13  Labs ordered for Monday 17. Hallucinations  Meds reviewed, Trazodone d/ced 11/25, several other possible  contributors  Will need to monitor closely, appears to be improving  LOS (Days) 16 A FACE TO FACE EVALUATION WAS PERFORMED  Ankit Karis Juba 01/12/2016, 4:02 PM

## 2016-01-12 NOTE — Progress Notes (Signed)
Speech Language Pathology Daily Session Note  Patient Details  Name: James Holden MRN: [id number] Date of Birth: 01/18/1961  Today's Date: 01/12/2016 SLP Individual Time: 1440-1510 SLP Individual Time Calculation (min): 30 min   Short Term Goals:Week 3: SLP Short Term Goal 1 (Week 3): Patient will consume current diet without overt s/s of aspiration with Mod I for use of swallowing compensatory strategies.  SLP Short Term Goal 2 (Week 3): Patient will demonstrate efficient mastication and oral clearance with trials of regular textures over 2 sessions prior to upgrade with supervision verbal cues.  SLP Short Term Goal 3 (Week 3): Patient will maintain eye contact at midline to conversation partner during functional conversation/task for ~30 seconds in 25% of opportunities with Mod A verbal and question cues.  SLP Short Term Goal 4 (Week 3): Patient will perform visual scanning tasks to locate items at midline with min A multimodal cues in 50% of opportunities.  SLP Short Term Goal 5 (Week 3): Patient will demonstrate basic problem solving in functional tasks with mod A verbal and question cues.  Skilled Therapeutic Interventions: Skilled treatment session focused on addressing cognition goals. SLP facilitated session by providing set-up of task with a familiar and new task rule for a total of 2 rules.  SLP also facilitated session with Mod increased to Max assist multimodal cues to scan left of midline to locate items for task completion by finding pairs of 11 or run of jack, queen, king.  Patient reported being fatigued, which SLP suspected to be cause of need for increased cues as session progressed.  Session ended with patient in bed and family at bedside.  Continue with current plan of care.    Function:  Eating Eating   Modified Consistency Diet: Yes Eating Assist Level: Set up assist for   Eating Set Up Assist For: Opening containers Helper Scoops Food on Utensil: Occasionally     Cognition Comprehension Comprehension assist level: Understands basic 90% of the time/cues < 10% of the time  Expression   Expression assist level: Expresses basic 75 - 89% of the time/requires cueing 10 - 24% of the time. Needs helper to occlude trach/needs to repeat words.  Social Interaction Social Interaction assist level: Interacts appropriately 75 - 89% of the time - Needs redirection for appropriate language or to initiate interaction.  Problem Solving Problem solving assist level: Solves basic 25 - 49% of the time - needs direction more than half the time to initiate, plan or complete simple activities  Memory Memory assist level: Recognizes or recalls 50 - 74% of the time/requires cueing 25 - 49% of the time    Pain Pain Assessment Pain Assessment: No/denies pain Pain Score: 0-No pain  Therapy/Group: Individual Therapy  Charlane FerrettiMelissa Aava Deland, M.A., CCC-SLP [phone]  [name] 01/12/2016, 3:54 PM

## 2016-01-13 ENCOUNTER — Inpatient Hospital Stay (HOSPITAL_COMMUNITY): Payer: BLUE CROSS/BLUE SHIELD

## 2016-01-13 ENCOUNTER — Inpatient Hospital Stay (HOSPITAL_COMMUNITY): Payer: BLUE CROSS/BLUE SHIELD | Admitting: Occupational Therapy

## 2016-01-13 ENCOUNTER — Inpatient Hospital Stay (HOSPITAL_COMMUNITY): Payer: BLUE CROSS/BLUE SHIELD | Admitting: Speech Pathology

## 2016-01-13 DIAGNOSIS — F339 Major depressive disorder, recurrent, unspecified: Secondary | ICD-10-CM

## 2016-01-13 DIAGNOSIS — E119 Type 2 diabetes mellitus without complications: Secondary | ICD-10-CM

## 2016-01-13 DIAGNOSIS — R443 Hallucinations, unspecified: Secondary | ICD-10-CM

## 2016-01-13 DIAGNOSIS — E871 Hypo-osmolality and hyponatremia: Secondary | ICD-10-CM

## 2016-01-13 LAB — BASIC METABOLIC PANEL
ANION GAP: 8 (ref 5–15)
BUN: 12 mg/dL (ref 6–20)
CALCIUM: 9.2 mg/dL (ref 8.9–10.3)
CO2: 26 mmol/L (ref 22–32)
CREATININE: 0.98 mg/dL (ref 0.61–1.24)
Chloride: 100 mmol/L — ABNORMAL LOW (ref 101–111)
GLUCOSE: 144 mg/dL — AB (ref 65–99)
Potassium: 4.3 mmol/L (ref 3.5–5.1)
Sodium: 134 mmol/L — ABNORMAL LOW (ref 135–145)

## 2016-01-13 LAB — CBC WITH DIFFERENTIAL/PLATELET
BASOS ABS: 0 10*3/uL (ref 0.0–0.1)
BASOS PCT: 0 %
EOS ABS: 0.2 10*3/uL (ref 0.0–0.7)
Eosinophils Relative: 3 %
HEMATOCRIT: 35.3 % — AB (ref 39.0–52.0)
Hemoglobin: 11.1 g/dL — ABNORMAL LOW (ref 13.0–17.0)
Lymphocytes Relative: 36 %
Lymphs Abs: 2.5 10*3/uL (ref 0.7–4.0)
MCH: 26.9 pg (ref 26.0–34.0)
MCHC: 31.4 g/dL (ref 30.0–36.0)
MCV: 85.7 fL (ref 78.0–100.0)
MONO ABS: 0.5 10*3/uL (ref 0.1–1.0)
Monocytes Relative: 7 %
NEUTROS ABS: 3.7 10*3/uL (ref 1.7–7.7)
Neutrophils Relative %: 54 %
PLATELETS: 181 10*3/uL (ref 150–400)
RBC: 4.12 MIL/uL — ABNORMAL LOW (ref 4.22–5.81)
RDW: 14.8 % (ref 11.5–15.5)
WBC: 6.9 10*3/uL (ref 4.0–10.5)

## 2016-01-13 LAB — GLUCOSE, CAPILLARY
GLUCOSE-CAPILLARY: 136 mg/dL — AB (ref 65–99)
GLUCOSE-CAPILLARY: 138 mg/dL — AB (ref 65–99)
GLUCOSE-CAPILLARY: 101 mg/dL — AB (ref 65–99)
Glucose-Capillary: 182 mg/dL — ABNORMAL HIGH (ref 65–99)

## 2016-01-13 NOTE — Progress Notes (Signed)
Occupational Therapy Session Note  Patient Details  Name: James Holden MRN: [id number] Date of Birth: 02/03/1961  Today's Date: 01/13/2016 OT Individual Time: 0905-1005 OT Individual Time Calculation (min): 60 min     Short Term Goals:Week 3:  OT Short Term Goal 1 (Week 3): Pt will visually scan left of midline to locate grooming, bathing, or feeding items with no more than mod instructional cueing.   OT Short Term Goal 2 (Week 3): Pt will transfer stand pivot to the walk-in shower with supervision.   OT Short Term Goal 3 (Week 3): Pt will complete LB dressing with mod assist to donn pullover pants only. OT Short Term Goal 3 - Progress (Week 3): Other (comment) OT Short Term Goal 4 (Week 3): Pt will complete toilet transfer with mod assist stand pivot to elevated toilet.   OT Short Term Goal 5 (Week 3): Pt will use the LUE as a stabilizer with selfcare tasks with max assist.   Skilled Therapeutic Interventions/Progress Updates:    Pt seen this session to work on balance, L side awareness and scanning, sit to stand, activity tolerance. Pt received in bed and agreeable to B/D at sink level. He did not want to try a shower.  Pt sat to EOB with min A by fully rolling onto L side and using R arm to push up. Demonstrated stable static sit with S only.  Used stedy for transfer. Pt able to pull into stand using R hand with min A only and maintained trunk control in [place] well. Transferred to wc.  Worked on UB B/D with min A. Good attention to LUE. Pt completed LB bathing with min from sitting. Sit to stand at sink with R hand for support with mod A and maintained upright posture with mod A for therapist to wash bottom and adjust clothing over hips.  Pt fatigued after standing for just 1 minute and stated he had to sit down.  Completed grooming tasks with set up.  Pt stated his LUE has been very painful.  Supported arm on lap tray with extra towels for increased height.  Wrist flexors tight, PROM to  bring into extension with gentle stretching. Attempted to have pt do self ROM of wrist with his hands clasped but he had pain in his MP joints. Focused on gentle shoulder horizontal abd to neutral with slight external rotation and gentle massage over anterior shoulder. Pt felt this was very helpful and it did provide him some pain relief.  Hand off to SLP therapist for the next session.  Therapy Documentation Precautions:  Precautions Precautions: Fall Precaution Comments: L hemiplegia, R hemicraniotomy  Required Braces or Orthoses: Other Brace/Splint Other Brace/Splint: Helmet when OOB Restrictions Weight Bearing Restrictions: No    Vital Signs: Therapy Vitals Temp: 98.1 F (36.7 C) Temp Source: Oral Pulse Rate: (!) 46 Resp: 18 BP: 112/68 Patient Position (if appropriate): Lying Oxygen Therapy SpO2: 99 % O2 Device: Not Delivered Pain: Pain Assessment Pain Assessment: No/denies pain ADL: ADL ADL Comments: Please see functional navigator  See Function Navigator for Current Functional Status.   Therapy/Group: Individual Therapy  SAGUIER,JULIA 01/13/2016, 8:32 AM

## 2016-01-13 NOTE — Progress Notes (Signed)
Physical Therapy Note  Patient Details  Name: James Holden George Shibata MRN: [id number] Date of Birth: 08/04/1960 Today's Date: 01/13/2016  1350-1445, 55 min individual tx Pain: none reported  Pt exhausted and abdomen sore due to constipation/meds/straining on toilet last night.  In supine in bed, neuromuscular re-education via multimodal cues for bil bridging, bil lower trunk rotation, R/L straight leg raises (L assisted), L mass flexion/ext, modified abdominal crunches, and bil glut sets x count of 3. In R side lying with hips flexed- L hip abduction/adduction (clam shells) L hip flex/extension and isolated L knee ext/flexion.  Bed mobility in flat bed to scoot laterally and up in bed. Blocked practice for rolling R with attention to LUE and LLE.  Pt successful for rolling R with railing, without cues after 5 reps. Pt left resting in bed with alarm set and all needs within reach.  Ezra Denne 01/13/2016, 2:18 PM

## 2016-01-13 NOTE — Progress Notes (Signed)
Subjective/Complaints: Moved bowels yesterday but has had constipation  ROS: Denies CP, SOB, N/V/D.   Objective: Vital Signs: Blood pressure 112/68, pulse (!) 46, temperature 98.1 F (36.7 C), temperature source Oral, resp. rate 18, height 5\' 10"  (1.778 m), weight 89.9 kg (198 lb 3.1 oz), SpO2 99 %. No results found. Results for orders placed or performed during the hospital encounter of 12/27/15 (from the past 72 hour(s))  Glucose, capillary     Status: Abnormal   Collection Time: 01/10/16 11:56 AM  Result Value Ref Range   Glucose-Capillary 191 (H) 65 - 99 mg/dL  Glucose, capillary     Status: Abnormal   Collection Time: 01/10/16  4:37 PM  Result Value Ref Range   Glucose-Capillary 162 (H) 65 - 99 mg/dL  Glucose, capillary     Status: Abnormal   Collection Time: 01/10/16  8:51 PM  Result Value Ref Range   Glucose-Capillary 153 (H) 65 - 99 mg/dL  Glucose, capillary     Status: Abnormal   Collection Time: 01/11/16  6:44 AM  Result Value Ref Range   Glucose-Capillary 119 (H) 65 - 99 mg/dL  Glucose, capillary     Status: Abnormal   Collection Time: 01/11/16 11:19 AM  Result Value Ref Range   Glucose-Capillary 137 (H) 65 - 99 mg/dL  Glucose, capillary     Status: Abnormal   Collection Time: 01/11/16  4:32 PM  Result Value Ref Range   Glucose-Capillary 175 (H) 65 - 99 mg/dL  Glucose, capillary     Status: Abnormal   Collection Time: 01/11/16  8:57 PM  Result Value Ref Range   Glucose-Capillary 137 (H) 65 - 99 mg/dL  Glucose, capillary     Status: Abnormal   Collection Time: 01/12/16  6:54 AM  Result Value Ref Range   Glucose-Capillary 133 (H) 65 - 99 mg/dL  Glucose, capillary     Status: Abnormal   Collection Time: 01/12/16 12:05 PM  Result Value Ref Range   Glucose-Capillary 148 (H) 65 - 99 mg/dL  Glucose, capillary     Status: Abnormal   Collection Time: 01/12/16  4:31 PM  Result Value Ref Range   Glucose-Capillary 186 (H) 65 - 99 mg/dL  Glucose, capillary      Status: Abnormal   Collection Time: 01/12/16  8:57 PM  Result Value Ref Range   Glucose-Capillary 161 (H) 65 - 99 mg/dL  Glucose, capillary     Status: Abnormal   Collection Time: 01/13/16  6:37 AM  Result Value Ref Range   Glucose-Capillary 136 (H) 65 - 99 mg/dL     Physical Exam: General: NAD. Well-developed.  ENT- former trach site CDI,  Psych: Mood and affect are appropriate Head: craniectomy site without swelling Heart: RRR. No JVD Lungs: CTA. Unlabored. Abdomen: +BS, soft. PEG site CDI Musc: no edema. No tenderness. Skin: no rash. Warm and dry. Neurologic:  Motor strength is 5/5 in Right  deltoid, bicep, tricep, grip, hip flexor, knee extensors, ankle dorsiflexor and plantar flexor 2-/5 Left hip flexion  LUE: 0/5  Sensation diminished to light touch LUE  Assessment/Plan: 1. Functional deficits secondary to RIght MCA infarct with left hemiplegia which require 3+ hours per day of interdisciplinary therapy in a comprehensive inpatient rehab setting. Physiatrist is providing close team supervision and 24 hour management of active medical problems listed below. Physiatrist and rehab team continue to assess barriers to discharge/monitor patient progress toward functional and medical goals. FIM: Function - Bathing Position: Sitting EOB Body parts bathed  by patient: Left arm, Chest, Abdomen, Front perineal area, Right upper leg, Left upper leg, Right lower leg Body parts bathed by helper: Left lower leg, Back, Right arm Bathing not applicable: Front perineal area, Buttocks (did not attempt this session) Assist Level: Touching or steadying assistance(Pt > 75%)  Function- Upper Body Dressing/Undressing What is the patient wearing?: Pull over shirt/dress Pull over shirt/dress - Perfomed by patient: Thread/unthread right sleeve, Put head through opening, Pull shirt over trunk Pull over shirt/dress - Perfomed by helper: Thread/unthread left sleeve Assist Level: Touching or  steadying assistance(Pt > 75%) Function - Lower Body Dressing/Undressing What is the patient wearing?: Pants, Ted Hose, Shoes Position: Bed Pants- Performed by helper: Thread/unthread right pants leg, Thread/unthread left pants leg, Pull pants up/down Non-skid slipper socks- Performed by helper: Don/doff right sock, Don/doff left sock Socks - Performed by patient: Don/doff right sock, Don/doff left sock Socks - Performed by helper: Don/doff right sock, Don/doff left sock Shoes - Performed by helper: Don/doff right shoe, Don/doff left shoe, Fasten right, Fasten left TED Hose - Performed by helper: Don/doff right TED hose, Don/doff left TED hose Assist for footwear: Dependant Assist for lower body dressing:  (total)  Function - Toileting Toileting activity did not occur: Safety/medical concerns Toileting steps completed by helper: Adjust clothing prior to toileting, Performs perineal hygiene, Adjust clothing after toileting Toileting Assistive Devices: Other (comment) (lift) Assist level: Two helpers  Function - Archivist transfer activity did not occur: Safety/medical concerns Toilet transfer assistive device: Systems developer lift: Huntley Dec Assist level to toilet: 2 helpers Assist level from toilet: 2 helpers  Function - Chair/bed transfer Chair/bed transfer method: Squat pivot Chair/bed transfer assist level: Moderate assist (Pt 50 - 74%/lift or lower) Chair/bed transfer assistive device: Sliding board, Armrests Mechanical lift: Huntley Dec Chair/bed transfer details: Verbal cues for technique, Tactile cues for weight shifting, Tactile cues for sequencing, Verbal cues for sequencing  Function - Locomotion: Wheelchair Will patient use wheelchair at discharge?: Yes Type: Manual Max wheelchair distance: 80 ft Assist Level: Supervision or verbal cues Assist Level: Supervision or verbal cues Wheel 150 feet activity did not occur: Safety/medical concerns Assist Level:  Supervision or verbal cues Turns around,maneuvers to table,bed, and toilet,negotiates 3% grade,maneuvers on rugs and over doorsills: No Function - Locomotion: Ambulation Ambulation activity did not occur: Safety/medical concerns Assistive device: Rail in hallway Max distance: 3 Assist level: Maximal assist (Pt 25 - 49%) Walk 10 feet activity did not occur: Safety/medical concerns Assist level: 2 helpers Walk 50 feet with 2 turns activity did not occur: Safety/medical concerns Walk 150 feet activity did not occur: Safety/medical concerns Walk 10 feet on uneven surfaces activity did not occur: Safety/medical concerns  Function - Comprehension Comprehension: Auditory Comprehension assist level: Understands basic 90% of the time/cues < 10% of the time  Function - Expression Expression: Verbal Expression assist level: Expresses basic 75 - 89% of the time/requires cueing 10 - 24% of the time. Needs helper to occlude trach/needs to repeat words.  Function - Social Interaction Social Interaction assist level: Interacts appropriately 75 - 89% of the time - Needs redirection for appropriate language or to initiate interaction.  Function - Problem Solving Problem solving assist level: Solves basic 25 - 49% of the time - needs direction more than half the time to initiate, plan or complete simple activities  Function - Memory Memory assist level: Recognizes or recalls 50 - 74% of the time/requires cueing 25 - 49% of the time Patient normally able to  recall (first 3 days only): Current season, Staff names and faces, That he or she is in a hospital  Medical Problem List and Plan: 1.  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  .             -Cont CIR   PT, OT, SLP 2.  DVT Prophylaxis/Anticoagulation: Subcutaneous heparin for DVT prophylaxis 3. Pain Management/chronic back pain: Oxycodone 5 mg every 6 hours as needed  -continue baclofen prn for  cramps/spasms.  LUE shoulder pain +/- hand pain, no signs of RSD, may just need some ROM today, will monitor 4. Mood: Prozac 40 mg daily.  Abilify 5 mg daily 5. Neuropsych: This patient is capable of making decisions on his own behalf.Poor attn: increased ritalin 10mg  BID 6. Skin/Wound Care: Routine skin checks 7. 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 Gastrostomy PEG tube 12/05/2015. Nocturnal tube feeds recently discontinued. Currently on dysphagia #3/thin.              -regular PEG maintenance, may d/c after 01/19/2016 10. Hypertension.   -dced Hydralazine 10 mg every 8 hours  -continue amlodipine 5 mg daily, lisinopril 30 mg daily with hold parameters  Fairly controlled 11/26 Vitals:   01/12/16 1330 01/13/16 0447  BP: (!) 110/47 112/68  Pulse: (!) 44 (!) 46  Resp: 18 18  Temp: 97.5 F (36.4 C) 98.1 F (36.7 C)   11. Diabetes mellitus. Lantus insulin 25 units daily at bedtime.  Intake is good  Check blood sugars before meals and at bedtime  Relatively controlled 11/26 CBG (last 3)   Recent Labs  01/12/16 1631 01/12/16 2057 01/13/16 0637  GLUCAP 186* 161* 136*   12. Hyperlipidemia. Lipitor 80 mg daily at bedtime 13.Tobacco abuse. Counseling as appropriate 14.  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 ?RLS- pt c/o symptoms BLE prior to CVA   -low dose requip started, no complaints of symptoms at night  -also has prn baclofen as above 16. Hyponatremia  Na 131 on 11/13  Labs ordered for Monday 17. Hallucinations  Meds reviewed, Trazodone d/ced 11/25, several other possible contributors  Will need to monitor closely, appears to be  improving  LOS (Days) 17 A FACE TO FACE EVALUATION WAS PERFORMED  KIRSTEINS,ANDREW E 01/13/2016, 8:04 AM

## 2016-01-13 NOTE — Progress Notes (Signed)
Incontinent of B & B during night. Encouraged patient to call for assistance with urinal, but calls after incontinent. Offered to use condom cath, but patient refuses. Alfredo MartinezMurray, Fritzie Prioleau A

## 2016-01-13 NOTE — Progress Notes (Signed)
Physical Therapy Session Note  Patient Details  Name: James Holden MRN: [id number] Date of Birth: 11/18/1960  Today's Date: 01/13/2016 PT Individual Time: 1126-1156 PT Individual Time Calculation (min): 30 min    Skilled Therapeutic Interventions/Progress Updates:    Session focused on neuro re-ed to address postural control, balance,weightbearing through LUE, trunk dissociation, and L neglect during transfers, dynamic sitting balance/trunk elongation/shortenting activities(during functional reaching task to place specified items on piece of tape on table in left visual field), and bed mobility. Pt completed min assist slideboard transfers to the R and mod assist to the L with focus on lifting bottom vs scooting, correct hand placement, head/hips relationship, and anterior weightshifting. Pt returned back to bed end of session due to fatigue and left with RN.    Therapy Documentation Precautions:  Precautions Precautions: Fall Precaution Comments: L hemiplegia, R hemicraniotomy  Required Braces or Orthoses: Other Brace/Splint Other Brace/Splint: Helmet when OOB Restrictions Weight Bearing Restrictions: No  Pain: Reports L shoulder pain - RN aware.   See Function Navigator for Current Functional Status.   Therapy/Group: Individual Therapy  Karolee StampsGray, Dorine Duffey Darrol PokeBrescia  Concettina Leth B. Tifany Hirsch, PT, DPT  01/13/2016, 12:02 PM

## 2016-01-13 NOTE — Progress Notes (Signed)
Speech Language Pathology Daily Session Note  Patient Details  Name: James Holden MRN: [id number] Date of Birth: 10/20/1960  Today's Date: 01/13/2016 SLP Individual Time: 1005-1100 SLP Individual Time Calculation (min): 55 min   Short Term Goals:Week 3: SLP Short Term Goal 1 (Week 3): Patient will consume current diet without overt s/s of aspiration with Mod I for use of swallowing compensatory strategies.  SLP Short Term Goal 2 (Week 3): Patient will demonstrate efficient mastication and oral clearance with trials of regular textures over 2 sessions prior to upgrade with supervision verbal cues.  SLP Short Term Goal 3 (Week 3): Patient will maintain eye contact at midline to conversation partner during functional conversation/task for ~30 seconds in 25% of opportunities with Mod A verbal and question cues.  SLP Short Term Goal 4 (Week 3): Patient will perform visual scanning tasks to locate items at midline with min A multimodal cues in 50% of opportunities.  SLP Short Term Goal 5 (Week 3): Patient will demonstrate basic problem solving in functional tasks with mod A verbal and question cues.  Skilled Therapeutic Interventions:  Pt was seen for skilled ST targeting cognitive goals.  SLP utilized the [employer] to address visual scanning to the left of midline and measure progress from initial attempt.  Pt demonstrated improved average reaction time overall in comparison to initial trials; however, his reaction time for locating objects on the left is still at least 3x longer then when locating objects on his right.  Pt initially required min assist verbal cues for use of visual scanning techniques to locate targets in a highly controlled setting, but as task progressed therapist was able to fade cues to supervision for 2 consecutive trials.  Therapist also facilitated the session with a previously taught card game to address attention to task and scanning to the left of midline.  Pt needed min  verbal and visual cues to locate cards in bottom left quadrant of visual field.  He was able to sustain his attention to task for 15 minute intervals in a mildly distracting environment with supervision verbal cues for redirection.      Function:  Eating Eating             Cognition Comprehension Comprehension assist level: Follows basic conversation/direction with extra time/assistive device  Expression   Expression assist level: Expresses basic needs/ideas: With extra time/assistive device  Social Interaction Social Interaction assist level: Interacts appropriately 75 - 89% of the time - Needs redirection for appropriate language or to initiate interaction.  Problem Solving Problem solving assist level: Solves basic 50 - 74% of the time/requires cueing 25 - 49% of the time  Memory Memory assist level: Recognizes or recalls 75 - 89% of the time/requires cueing 10 - 24% of the time    Pain Pain Assessment Pain Assessment: No/denies pain  Therapy/Group: Individual Therapy  Tanya Crothers, Joni ReiningNicole L 01/13/2016, 11:01 AM

## 2016-01-14 ENCOUNTER — Inpatient Hospital Stay (HOSPITAL_COMMUNITY): Payer: BLUE CROSS/BLUE SHIELD | Admitting: Speech Pathology

## 2016-01-14 ENCOUNTER — Inpatient Hospital Stay (HOSPITAL_COMMUNITY): Payer: BLUE CROSS/BLUE SHIELD | Admitting: Occupational Therapy

## 2016-01-14 ENCOUNTER — Inpatient Hospital Stay (HOSPITAL_COMMUNITY): Payer: BLUE CROSS/BLUE SHIELD | Admitting: Physical Therapy

## 2016-01-14 LAB — GLUCOSE, CAPILLARY
GLUCOSE-CAPILLARY: 136 mg/dL — AB (ref 65–99)
GLUCOSE-CAPILLARY: 118 mg/dL — AB (ref 65–99)
Glucose-Capillary: 146 mg/dL — ABNORMAL HIGH (ref 65–99)
Glucose-Capillary: 127 mg/dL — ABNORMAL HIGH (ref 65–99)

## 2016-01-14 NOTE — Progress Notes (Addendum)
Subjective/Complaints: Moved bowels yesterday but has had constipation  ROS: Denies CP, SOB, N/V/D.   Objective: Vital Signs: Blood pressure (!) 122/58, pulse (!) 59, temperature 98 F (36.7 C), temperature source Oral, resp. rate 18, height _0  (1.778 m), weight 89.4 kg (197 lb 1.5 oz), SpO2 99 %. No results found. Results for orders placed or performed during the hospital encounter of 12/27/15 (from the past 72 hour(s))  Glucose, capillary     Status: Abnormal   Collection Time: 01/11/16 11:19 AM  Result Value Ref Range   Glucose-Capillary 137 (H) 65 - 99 mg/dL  Glucose, capillary     Status: Abnormal   Collection Time: 01/11/16  4:32 PM  Result Value Ref Range   Glucose-Capillary 175 (H) 65 - 99 mg/dL  Glucose, capillary     Status: Abnormal   Collection Time: 01/11/16  8:57 PM  Result Value Ref Range   Glucose-Capillary 137 (H) 65 - 99 mg/dL  Glucose, capillary     Status: Abnormal   Collection Time: 01/12/16  6:54 AM  Result Value Ref Range   Glucose-Capillary 133 (H) 65 - 99 mg/dL  Glucose, capillary     Status: Abnormal   Collection Time: 01/12/16 12:05 PM  Result Value Ref Range   Glucose-Capillary 148 (H) 65 - 99 mg/dL  Glucose, capillary     Status: Abnormal   Collection Time: 01/12/16  4:31 PM  Result Value Ref Range   Glucose-Capillary 186 (H) 65 - 99 mg/dL  Glucose, capillary     Status: Abnormal   Collection Time: 01/12/16  8:57 PM  Result Value Ref Range   Glucose-Capillary 161 (H) 65 - 99 mg/dL  Glucose, capillary     Status: Abnormal   Collection Time: 01/13/16  6:37 AM  Result Value Ref Range   Glucose-Capillary 136 (H) 65 - 99 mg/dL  Glucose, capillary     Status: Abnormal   Collection Time: 01/13/16 11:57 AM  Result Value Ref Range   Glucose-Capillary 138 (H) 65 - 99 mg/dL  Basic metabolic panel     Status: Abnormal   Collection Time: 01/13/16 12:03 PM  Result Value Ref Range   Sodium 134 (L) 135 - 145 mmol/L   Potassium 4.3 3.5 - 5.1  mmol/L   Chloride 100 (L) 101 - 111 mmol/L   CO2 26 22 - 32 mmol/L   Glucose, Bld 144 (H) 65 - 99 mg/dL   BUN 12 6 - 20 mg/dL   Creatinine, Ser 0.98 0.61 - 1.24 mg/dL   Calcium 9.2 8.9 - 10.3 mg/dL   GFR calc non Af Amer >60 >60 mL/min   GFR calc Af Amer >60 >60 mL/min    Comment: (NOTE) The eGFR has been calculated using the CKD EPI equation. This calculation has not been validated in all clinical situations. eGFR's persistently <60 mL/min signify possible Chronic Kidney Disease.    Anion gap 8 5 - 15  CBC with Differential/Platelet     Status: Abnormal   Collection Time: 01/13/16 12:03 PM  Result Value Ref Range   WBC 6.9 4.0 - 10.5 K/uL   RBC 4.12 (L) 4.22 - 5.81 MIL/uL   Hemoglobin 11.1 (L) 13.0 - 17.0 g/dL   HCT 35.3 (L) 39.0 - 52.0 %   MCV 85.7 78.0 - 100.0 fL   MCH 26.9 26.0 - 34.0 pg   MCHC 31.4 30.0 - 36.0 g/dL   RDW 14.8 11.5 - 15.5 %   Platelets 181 150 - 400 K/uL  Neutrophils Relative % 54 %   Neutro Abs 3.7 1.7 - 7.7 K/uL   Lymphocytes Relative 36 %   Lymphs Abs 2.5 0.7 - 4.0 K/uL   Monocytes Relative 7 %   Monocytes Absolute 0.5 0.1 - 1.0 K/uL   Eosinophils Relative 3 %   Eosinophils Absolute 0.2 0.0 - 0.7 K/uL   Basophils Relative 0 %   Basophils Absolute 0.0 0.0 - 0.1 K/uL  Glucose, capillary     Status: Abnormal   Collection Time: 01/13/16  4:30 PM  Result Value Ref Range   Glucose-Capillary 101 (H) 65 - 99 mg/dL  Glucose, capillary     Status: Abnormal   Collection Time: 01/13/16  8:55 PM  Result Value Ref Range   Glucose-Capillary 182 (H) 65 - 99 mg/dL  Glucose, capillary     Status: Abnormal   Collection Time: 01/14/16  6:44 AM  Result Value Ref Range   Glucose-Capillary 136 (H) 65 - 99 mg/dL     Physical Exam: General: NAD. Well-developed.  ENT- former trach site CDI,  Psych: Mood and affect are appropriate Head: craniectomy site without swelling Heart: RRR. No JVD Lungs: CTA. Unlabored. Abdomen: +BS, soft. PEG site CDI Musc: no edema.  No tenderness. Skin: no rash. Warm and dry. Neurologic:  Motor strength is 5/5 in Right  deltoid, bicep, tricep, grip, hip flexor, knee extensors, ankle dorsiflexor and plantar flexor 2-/5 Left hip flexion  LUE: 0/5  Sensation diminished to light touch LUE  Assessment/Plan: 1. Functional deficits secondary to RIght MCA infarct with left hemiplegia which require 3+ hours per day of interdisciplinary therapy in a comprehensive inpatient rehab setting. Physiatrist is providing close team supervision and 24 hour management of active medical problems listed below. Physiatrist and rehab team continue to assess barriers to discharge/monitor patient progress toward functional and medical goals. FIM: Function - Bathing Position: Wheelchair/chair at sink Body parts bathed by patient: Left arm, Chest, Abdomen, Front perineal area, Right upper leg, Left upper leg, Right lower leg Body parts bathed by helper: Left lower leg, Back, Right arm, Buttocks Bathing not applicable: Front perineal area, Buttocks (did not attempt this session) Assist Level: Touching or steadying assistance(Pt > 75%)  Function- Upper Body Dressing/Undressing What is the patient wearing?: Pull over shirt/dress Pull over shirt/dress - Perfomed by patient: Thread/unthread right sleeve, Put head through opening, Pull shirt over trunk Pull over shirt/dress - Perfomed by helper: Thread/unthread left sleeve Assist Level: Touching or steadying assistance(Pt > 75%) Function - Lower Body Dressing/Undressing What is the patient wearing?: Pants, Maryln Manuel, Shoes Position: Education officer, museum at [employer]- Performed by helper: Thread/unthread right pants leg, Thread/unthread left pants leg, Pull pants up/down Non-skid slipper socks- Performed by helper: Don/doff right sock Socks - Performed by patient: Don/doff right sock, Don/doff left sock Socks - Performed by helper: Don/doff right sock, Don/doff left sock Shoes - Performed by helper:  Don/doff right shoe, Don/doff left shoe, Fasten right, Fasten left TED Hose - Performed by helper: Don/doff left TED hose Assist for footwear: Dependant Assist for lower body dressing:  (total)  Function - Toileting Toileting activity did not occur: Safety/medical concerns Toileting steps completed by helper: Adjust clothing prior to toileting, Performs perineal hygiene, Adjust clothing after toileting Toileting Assistive Devices: Other (comment) (lift) Assist level: Two helpers  Function - Air cabin crew transfer activity did not occur: Safety/medical concerns Toilet transfer assistive device: Facilities manager lift: Clarise Cruz Assist level to toilet: 2 helpers Assist level from toilet: 2 helpers  Function - Chair/bed transfer Chair/bed transfer method: Lateral scoot Chair/bed transfer assist level: Moderate assist (Pt 50 - 74%/lift or lower) Chair/bed transfer assistive device: Armrests, Sliding board Mechanical lift: Clarise Cruz Chair/bed transfer details: Verbal cues for technique, Tactile cues for weight shifting, Tactile cues for sequencing, Verbal cues for sequencing  Function - Locomotion: Wheelchair Will patient use wheelchair at discharge?: Yes Type: Manual Max wheelchair distance: 80 ft Assist Level: Supervision or verbal cues Assist Level: Supervision or verbal cues Wheel 150 feet activity did not occur: Safety/medical concerns Assist Level: Supervision or verbal cues Turns around,maneuvers to table,bed, and toilet,negotiates 3% grade,maneuvers on rugs and over doorsills: No Function - Locomotion: Ambulation Ambulation activity did not occur: Safety/medical concerns Assistive device: Rail in hallway Max distance: 3 Assist level: Maximal assist (Pt 25 - 49%) Walk 10 feet activity did not occur: Safety/medical concerns Assist level: 2 helpers Walk 50 feet with 2 turns activity did not occur: Safety/medical concerns Walk 150 feet activity did not occur:  Safety/medical concerns Walk 10 feet on uneven surfaces activity did not occur: Safety/medical concerns  Function - Comprehension Comprehension: Auditory Comprehension assist level: Follows basic conversation/direction with extra time/assistive device  Function - Expression Expression: Verbal Expression assist level: Expresses basic needs/ideas: With extra time/assistive device  Function - Social Interaction Social Interaction assist level: Interacts appropriately 75 - 89% of the time - Needs redirection for appropriate language or to initiate interaction.  Function - Problem Solving Problem solving assist level: Solves basic 50 - 74% of the time/requires cueing 25 - 49% of the time  Function - Memory Memory assist level: Recognizes or recalls 25 - 49% of the time/requires cueing 50 - 75% of the time Patient normally able to recall (first 3 days only): Current season, Staff names and faces, That he or she is in a hospital  Medical Problem List and Plan: 1.  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  .             -Cont CIR   PT, OT, SLP 2.  DVT Prophylaxis/Anticoagulation: Subcutaneous heparin for DVT prophylaxis 3. Pain Management/chronic back pain: Oxycodone 5 mg every 6 hours as needed  -continue baclofen prn for cramps/spasms.  LUE shoulder pain +/- hand pain, no signs of RSD, improved today       4. Mood: Prozac 40 mg daily.  Abilify 5 mg daily 5. Neuropsych: This patient is capable of making decisions on his own behalf.Poor attn: increased ritalin 8m BID 6. Skin/Wound Care: Routine skin checks 7. 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 Gastrostomy PEG tube 12/05/2015. Nocturnal tube feeds recently discontinued. Currently on dysphagia #3/thin.              -regular PEG maintenance, may d/c after 01/19/2016 10. Hypertension.   -dced  Hydralazine 10 mg every 8 hours  -continue amlodipine 5 mg daily, lisinopril 30 mg daily with hold parameters controlled 11/27 Vitals:   01/13/16 1501 01/14/16 0519  BP: (!) 124/54 (!) 122/58  Pulse: (!) 41 (!) 59  Resp: 18 18  Temp: 98.2 F (36.8 C) 98 F (36.7 C)   11. Diabetes mellitus. Lantus insulin 25 units daily at bedtime.  Intake is good  Check blood sugars before meals and at bedtime  Relatively controlled 11/27 except pm CBG elevated will monitor prior to change CBG (last 3)   Recent Labs  01/13/16 1630 01/13/16 2055 01/14/16 0644  GLUCAP  101* 182* 136*   12. Hyperlipidemia. Lipitor 80 mg daily at bedtime 13.Tobacco abuse. Counseling as appropriate 14.  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 ?RLS- pt c/o symptoms BLE prior to CVA   -low dose requip started, no complaints of symptoms at night  -also has prn baclofen as above 16. Hyponatremia  resolved   LOS (Days) 18 A FACE TO FACE EVALUATION WAS PERFORMED  KIRSTEINS,ANDREW E 01/14/2016, 7:56 AM

## 2016-01-14 NOTE — Progress Notes (Signed)
Physical Therapy Session Note  Patient Details  Name: James Holden MRN: [id number] Date of Birth: 06/11/1960  Today's Date: 01/14/2016 PT Individual Time: 1430-1540 PT Individual Time Calculation (min): 70 min   Skilled Therapeutic Interventions/Progress Updates:    Session focused on L attention, LLE neuro re-ed, activity tolerance, and safety awareness with progression of ambulation, stairs, and lateral scoot/squat pivot transfers without sliding board. Patient in bed upon arrival, donned helmet and performed rolling to don pants and sit EOB with mod A for PT to don shoes. Initial uphill slide board transfer to L from bed to wheelchair with mod A overall. Patient propelled wheelchair x 150 ft with supervision via R hemi technique and max verbal cues for steering in straight path/obstacle negotiation on L due to L neglect. Donned GiveMohr sling LUE in preparation for standing activities. Gait training using R rail in hallway x 30 ft with assist to advance/place LLE and verbal/tactile cues for sequencing with PT preventing L knee hyperextension in terminal stance phase progressed to gait using R hemiwalker x 15-20 ft with max A overall to advance/place LLE with cues for step-through pattern and safe placement of HW, +2 for wheelchair follow for safety. Initiated stair training up/down 4 (6") stairs using R rail with cues for step-to pattern and total A to advance LLE and max verbal/tactile cues for safe sequencing due to impulsivity. Standing with HW for intermittent RUE support for visual scanning and weight shifting task to L to retrieve card slightly across midline with RUE and place to target, 2 x 5 with seated rest break due to fatigue and B hip pain. Patient instructed in squat pivot transfers to R via Bobath technique x 3 to facilitate anterolateral lean to L with max multimodal cues for hand and foot placement and overall technique with max A. Patient left in R sidelying with needs in reach  and bed alarm on.   Therapy Documentation Precautions:  Precautions Precautions: Fall Precaution Comments: L hemiplegia, R hemicraniotomy  Required Braces or Orthoses: Other Brace/Splint Other Brace/Splint: Helmet when OOB Restrictions Weight Bearing Restrictions: No Pain: Pain Assessment Pain Assessment: 0-10 Pain Score: 6  Pain Type: Acute pain Pain Location: Hip Pain Orientation: Right;Left Pain Descriptors / Indicators: Aching Pain Onset: With Activity Pain Intervention(s): Repositioned;Rest  See Function Navigator for Current Functional Status.   Therapy/Group: Individual Therapy  Kerney ElbeVarner, Adaleigh Warf A 01/14/2016, 4:28 PM

## 2016-01-14 NOTE — Progress Notes (Signed)
Orthopedic Tech Progress Note Patient Details:  James Holden 07/05/1960 [id number]  Patient ID: James GentaJarvis George Holden, male   DOB: 07/01/1960, 55 y.o.   MRN: [id number]   Saul FordyceJennifer C Margerie Fraiser 01/14/2016, 1:57 PMCalled [facility] for left resting hand splint.

## 2016-01-14 NOTE — Progress Notes (Signed)
Occupational Therapy Session Note  Patient Details  Name: James Holden MRN: [id number] Date of Birth: 05/29/1960  Today's Date: 01/14/2016 OT Individual Time: [id number] OT Individual Time Calculation (min): 62 min     Short Term Goals: Week 3:  OT Short Term Goal 1 (Week 3): Pt will visually scan left of midline to locate grooming, bathing, or feeding items with no more than mod instructional cueing.   OT Short Term Goal 2 (Week 3): Pt will transfer stand pivot to the walk-in shower with supervision.   OT Short Term Goal 3 (Week 3): Pt will complete LB dressing with mod assist to donn pullover pants only. OT Short Term Goal 3 - Progress (Week 3): Other (comment) OT Short Term Goal 4 (Week 3): Pt will complete toilet transfer with mod assist stand pivot to elevated toilet.   OT Short Term Goal 5 (Week 3): Pt will use the LUE as a stabilizer with selfcare tasks with max assist.   Skilled Therapeutic Interventions/Progress Updates:    Pt completed shower and dressing during session.  Used Huntley DecSara for transfer from EOB to shower in order to evaluate use to update care plan.  He was able to complete sit to stand for use of sara with mod assist as well as subsequent sit to stand intervals from tub bench and wheelchair with mod assist.  Max hand over hand assistance needed to wash the right side using the LUE.  He was able to maintain sitting balance throughout bathing with min guard assist and for dressing with min assist.  He still needs max demonstrational cueing for scanning past midline to the left side to locate bathing items or when identifying parts of clothing for dressing.  Pt left in wheelchair with call button and half lap tray in place.  Also with safety belt in place.  Pt instructed to use call button if assistance is needed or if he wants to get out of the chair for any reason.   Therapy Documentation Precautions:  Precautions Precautions: Fall Precaution Comments: L hemiplegia, R  hemicraniotomy  Required Braces or Orthoses: Other Brace/Splint Other Brace/Splint: Helmet when OOB Restrictions Weight Bearing Restrictions: No  Pain: Pain Assessment Pain Assessment: No/denies pain ADL: See Function Navigator for Current Functional Status.   Therapy/Group: Individual Therapy  Nimsi Males OTR/L 01/14/2016, 1:14 PM

## 2016-01-14 NOTE — Progress Notes (Signed)
Speech Language Pathology Daily Session Note  Patient Details  Name: James Holden Mitchener MRN: [id number] Date of Birth: 02/20/1960  Today's Date: 01/14/2016 SLP Individual Time: 0800-0900 SLP Individual Time Calculation (min): 60 min   Short Term Goals:Week 3: SLP Short Term Goal 1 (Week 3): Patient will consume current diet without overt s/s of aspiration with Mod I for use of swallowing compensatory strategies.  SLP Short Term Goal 2 (Week 3): Patient will demonstrate efficient mastication and oral clearance with trials of regular textures over 2 sessions prior to upgrade with supervision verbal cues.  SLP Short Term Goal 3 (Week 3): Patient will maintain eye contact at midline to conversation partner during functional conversation/task for ~30 seconds in 25% of opportunities with Mod A verbal and question cues.  SLP Short Term Goal 4 (Week 3): Patient will perform visual scanning tasks to locate items at midline with min A multimodal cues in 50% of opportunities.  SLP Short Term Goal 5 (Week 3): Patient will demonstrate basic problem solving in functional tasks with mod A verbal and question cues.  Skilled Therapeutic Interventions: Pt was seen for skilled ST targeting cognitive and dysphagia goals.  Pt consumed dys 3 textures and thin liquids with supervision verbal cues for use of swallowing precautions.  No overt s/s of aspiration were evident with solids or liquids and pt was able to clear solids from the oral cavity without difficulty.  During meal, pt needed min assist verbal cues to locate items on his left side.  He also needed min verbal cues for redirection to task after ~5 minute intervals.  Pt was left in wheelchair with helmet and quick release belt donned at nursing station.  Continue per current plan of care.       Function:  Eating Eating   Modified Consistency Diet: Yes Eating Assist Level: Supervision or verbal cues;Set up assist for   Eating Set Up Assist For: Opening  containers       Cognition Comprehension Comprehension assist level: Follows basic conversation/direction with extra time/assistive device  Expression   Expression assist level: Expresses basic needs/ideas: With extra time/assistive device  Social Interaction Social Interaction assist level: Interacts appropriately 75 - 89% of the time - Needs redirection for appropriate language or to initiate interaction.  Problem Solving Problem solving assist level: Solves basic 50 - 74% of the time/requires cueing 25 - 49% of the time  Memory Memory assist level: Recognizes or recalls 50 - 74% of the time/requires cueing 25 - 49% of the time    Pain Pain Assessment Pain Assessment: No/denies pain  Therapy/Group: Individual Therapy  Vista Sawatzky, Melanee SpryNicole L 01/14/2016, 11:06 AM

## 2016-01-15 ENCOUNTER — Inpatient Hospital Stay (HOSPITAL_COMMUNITY): Payer: BLUE CROSS/BLUE SHIELD | Admitting: Speech Pathology

## 2016-01-15 ENCOUNTER — Inpatient Hospital Stay (HOSPITAL_COMMUNITY): Payer: BLUE CROSS/BLUE SHIELD | Admitting: Occupational Therapy

## 2016-01-15 ENCOUNTER — Inpatient Hospital Stay (HOSPITAL_COMMUNITY): Payer: BLUE CROSS/BLUE SHIELD | Admitting: Physical Therapy

## 2016-01-15 LAB — GLUCOSE, CAPILLARY
GLUCOSE-CAPILLARY: 124 mg/dL — AB (ref 65–99)
Glucose-Capillary: 178 mg/dL — ABNORMAL HIGH (ref 65–99)
Glucose-Capillary: 107 mg/dL — ABNORMAL HIGH (ref 65–99)
Glucose-Capillary: 150 mg/dL — ABNORMAL HIGH (ref 65–99)

## 2016-01-15 NOTE — Progress Notes (Signed)
Occupational Therapy Session Note  Patient Details  Name: James Holden MRN: [id number] Date of Birth: 03/17/1960  Today's Date: 01/15/2016 OT Individual Time: [id number] OT Individual Time Calculation (min): 60 min      Skilled Therapeutic Interventions/Progress Updates:    Pt completed neuromuscular re-education for the LUE during session.  Had pt transfer from the wheelchair to the mat with mod assist stand pivot.  Applied NMES to the left digit flexors and extensors.  Pt with increased flexor tone in the fingers after positioning of the Bioness so concentrated just on digit extension activation using Open Exercise Mode.  Incorporated shoulder flexion with elbow extension for simulated reaching along with stimulation.  Progressed to use of Grasp Mode with slight digit flexion and max hand over hand assistance to reach out and grasp cup and bring to mouth, then replace on the table.  Pt able to tolerate 15 mins of active stimulation.  Slight wrist pain in the radial side of wrist noted during stimulation as well as slight pain in the digits, but this improved when stimulation was decreased in intensity from 10 to 8.   Pt returned to wheelchair and back to room at completion of session.  Applied resting hand splint to the LUE once he returned to the room.  Safety belt and call button in reach.   Therapy Documentation Precautions:  Precautions Precautions: Fall Precaution Comments: L hemiplegia, R hemicraniotomy  Required Braces or Orthoses: Other Brace/Splint Other Brace/Splint: Helmet when OOB Restrictions Weight Bearing Restrictions: No  Pain: Pain Assessment Pain Assessment: Faces Pain Score: 0-No pain Faces Pain Scale: Hurts a little bit Pain Type: Acute pain Pain Location: Wrist Pain Orientation: Left Pain Descriptors / Indicators: Discomfort Pain Onset: With Activity Patients Stated Pain Goal: 2 Pain Intervention(s): Repositioned ADL: See Function Navigator for Current  Functional Status.   Therapy/Group: Individual Therapy  Ramar Nobrega OTR/L 01/15/2016, 12:15 PM

## 2016-01-15 NOTE — Patient Care Conference (Signed)
Inpatient RehabilitationTeam Conference and Plan of Care Update Date: 01/15/2016   Time: 10:55 AM    Patient Name: James Holden      Medical Record Number: [id number]  Date of Birth: 04/21/1960 Sex: Male         Room/Bed: 4W26C/4W26C-01 Payor Info: Payor: BLUE CROSS BLUE SHIELD / Plan: BCBS OTHER / Product Type: *No Product type* /    Admitting Diagnosis: CVA With Craniotomy  Admit Date/Time:  12/27/2015  2:13 PM Admission Comments: No comment available   Primary Diagnosis:  <principal problem not specified> Principal Problem: <principal problem not specified>  Patient Active Problem List   Diagnosis Date Noted  . Hyponatremia   . Hallucinations   . Diabetes mellitus type 2 in nonobese (HCC)   . Benign essential HTN   . Recurrent major depressive disorder (HCC)   . 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 Claudette LawsAndrew Kirsteins Social Worker Present: Dossie DerBecky Dottie Vaquerano, LCSW Nurse Present: Chana Bodeeborah Sharp, RN PT Present: Grier RocherAustin Tucker, PT OT Present: Perrin MalteseJames McGuire, OT SLP Present: Jackalyn LombardNicole Page, SLP PPS Coordinator present : Tora DuckMarie Noel, RN, CRRN     Current Status/Progress Goal Weekly Team Focus  Medical   left hemi developing spasticity, dysphagia improving  D/C PEG prior to home,   improve spasticity   Bowel/Bladder   contient of bowel and bladder with staff assistance. Occasional incontinence of bladder at hs   decrease QHS incontinence, and manage bowel and bladder with min assist  Encourage pt to be more independent with urinal.    Swallow/Nutrition/ Hydration   Upgraded to Regular textures, thin liquids; supervision   supervisoin   toleration of upgrade, carryover of swallowing strategies   ADL's   Min assist for UB bathing, and Mod assist for LB bathing,  Mod assist UB dressing with max assist for LB dressing.  Mod to max assist for stand  pivot transfers.  Still with left neglect including right head turn preference.  Brunnstrum stage II in the left hand and arm.  Tone beginning to be noted in the finger flexors.    min assist overall  selfcare retraining, balance retraining, functional transfers, neuromuscular re-education, visual compensation, pt/family education   Mobility   Min assist level SB transfers to the R. Mod assist squat pivot transfer to R and L. min-mod assist bed mobility. patient able to attend to L side with Mod cues LLE >LUE, supervision Assist with WC mobility.   Min assist  with transfers and Bed mobility Supervision Assist with WC mobility.   improved atterntion to the L side as well as improved motor control of the LLE. postural control and strength with transfers, tolerance with gait training.    Communication   d/c goals, pt mod I for intelligibility and functional communication         Safety/Cognition/ Behavioral Observations  Mod assist   min assist   left inattention, sustained attention to tasks, and awareness of deficits   Pain   Occasional complaints of pain to left leg. Prn oxycodone   Maintain pain management with prn medication 3 or less  assess pain and medicate as needed    Skin   Peg tube with small amount of redness. Bruising to abdomen related to heparin sq. MASD to bottom.   Holden of skin breakdown min assist  assess skin q shift and encourage pressure relief.       *  See Care Plan and progress notes for long and short-term goals.  Barriers to Discharge: Severe Left neglect, Spasticity    Possible Resolutions to Barriers:  extend rehab stay, spasticity     Discharge Planning/Teaching Needs:  Family committed to taking pt home has been here to observe waiting when team needs them to begin education for home      Team Discussion:  Pt making good progress in therapies, feel an extension would benefit him and make him a more consistent min assist level. Upgraded diet to regular today.  Tone in L-UE and L-LE-MD addressing. Improved awareness with left side. Walked 20 ft with hemi walker with mod assist level.   Revisions to Treatment Plan:  Want one week extension and upgraded diet to regular   Continued Need for Acute Rehabilitation Level of Care: The patient requires daily medical management by a physician with specialized training in physical medicine and rehabilitation for the following conditions: Daily direction of a multidisciplinary physical rehabilitation program to ensure safe treatment while eliciting the highest outcome that is of practical value to the patient.: Yes Daily medical management of patient stability for increased activity during participation in an intensive rehabilitation regime.: Yes Daily analysis of laboratory values and/or radiology reports with any subsequent need for medication adjustment of medical intervention for : Neurological problems;Nutritional problems  James Holden, James LivingsRebecca Holden 01/16/2016, 9:06 AM

## 2016-01-15 NOTE — Progress Notes (Signed)
Subjective/Complaints: No issues overnite  ROS: Denies CP, SOB, N/V/D.   Objective: Vital Signs: Blood pressure (!) 160/62, pulse (!) 40, temperature 98.4 F (36.9 C), temperature source Oral, resp. rate 16, height 5' 10"  (1.778 m), weight 87 kg (191 lb 14.4 oz), SpO2 98 %. No results found. Results for orders placed or performed during the hospital encounter of 12/27/15 (from the past 72 hour(s))  Glucose, capillary     Status: Abnormal   Collection Time: 01/12/16 12:05 PM  Result Value Ref Range   Glucose-Capillary 148 (H) 65 - 99 mg/dL  Glucose, capillary     Status: Abnormal   Collection Time: 01/12/16  4:31 PM  Result Value Ref Range   Glucose-Capillary 186 (H) 65 - 99 mg/dL  Glucose, capillary     Status: Abnormal   Collection Time: 01/12/16  8:57 PM  Result Value Ref Range   Glucose-Capillary 161 (H) 65 - 99 mg/dL  Glucose, capillary     Status: Abnormal   Collection Time: 01/13/16  6:37 AM  Result Value Ref Range   Glucose-Capillary 136 (H) 65 - 99 mg/dL  Glucose, capillary     Status: Abnormal   Collection Time: 01/13/16 11:57 AM  Result Value Ref Range   Glucose-Capillary 138 (H) 65 - 99 mg/dL  Basic metabolic panel     Status: Abnormal   Collection Time: 01/13/16 12:03 PM  Result Value Ref Range   Sodium 134 (L) 135 - 145 mmol/L   Potassium 4.3 3.5 - 5.1 mmol/L   Chloride 100 (L) 101 - 111 mmol/L   CO2 26 22 - 32 mmol/L   Glucose, Bld 144 (H) 65 - 99 mg/dL   BUN 12 6 - 20 mg/dL   Creatinine, Ser 0.98 0.61 - 1.24 mg/dL   Calcium 9.2 8.9 - 10.3 mg/dL   GFR calc non Af Amer >60 >60 mL/min   GFR calc Af Amer >60 >60 mL/min    Comment: (NOTE) The eGFR has been calculated using the CKD EPI equation. This calculation has not been validated in all clinical situations. eGFR's persistently <60 mL/min signify possible Chronic Kidney Disease.    Anion gap 8 5 - 15  CBC with Differential/Platelet     Status: Abnormal   Collection Time: 01/13/16 12:03 PM  Result  Value Ref Range   WBC 6.9 4.0 - 10.5 K/uL   RBC 4.12 (L) 4.22 - 5.81 MIL/uL   Hemoglobin 11.1 (L) 13.0 - 17.0 g/dL   HCT 35.3 (L) 39.0 - 52.0 %   MCV 85.7 78.0 - 100.0 fL   MCH 26.9 26.0 - 34.0 pg   MCHC 31.4 30.0 - 36.0 g/dL   RDW 14.8 11.5 - 15.5 %   Platelets 181 150 - 400 K/uL   Neutrophils Relative % 54 %   Neutro Abs 3.7 1.7 - 7.7 K/uL   Lymphocytes Relative 36 %   Lymphs Abs 2.5 0.7 - 4.0 K/uL   Monocytes Relative 7 %   Monocytes Absolute 0.5 0.1 - 1.0 K/uL   Eosinophils Relative 3 %   Eosinophils Absolute 0.2 0.0 - 0.7 K/uL   Basophils Relative 0 %   Basophils Absolute 0.0 0.0 - 0.1 K/uL  Glucose, capillary     Status: Abnormal   Collection Time: 01/13/16  4:30 PM  Result Value Ref Range   Glucose-Capillary 101 (H) 65 - 99 mg/dL  Glucose, capillary     Status: Abnormal   Collection Time: 01/13/16  8:55 PM  Result Value  Ref Range   Glucose-Capillary 182 (H) 65 - 99 mg/dL  Glucose, capillary     Status: Abnormal   Collection Time: 01/14/16  6:44 AM  Result Value Ref Range   Glucose-Capillary 136 (H) 65 - 99 mg/dL  Glucose, capillary     Status: Abnormal   Collection Time: 01/14/16 12:05 PM  Result Value Ref Range   Glucose-Capillary 118 (H) 65 - 99 mg/dL  Glucose, capillary     Status: Abnormal   Collection Time: 01/14/16  4:51 PM  Result Value Ref Range   Glucose-Capillary 146 (H) 65 - 99 mg/dL  Glucose, capillary     Status: Abnormal   Collection Time: 01/14/16  9:01 PM  Result Value Ref Range   Glucose-Capillary 127 (H) 65 - 99 mg/dL   Comment 1 Notify RN   Glucose, capillary     Status: Abnormal   Collection Time: 01/15/16  6:32 AM  Result Value Ref Range   Glucose-Capillary 124 (H) 65 - 99 mg/dL   Comment 1 Notify RN      Physical Exam: General: NAD. Well-developed.  ENT- former trach site CDI,  Psych: Mood and affect are appropriate Head: craniectomy site without swelling Heart: RRR. No JVD Lungs: CTA. Unlabored. Abdomen: +BS, soft. PEG site  CDI Musc: no edema. No tenderness. Skin: no rash. Warm and dry. Neurologic:  Motor strength is 5/5 in Right  deltoid, bicep, tricep, grip, hip flexor, knee extensors, ankle dorsiflexor and plantar flexor 2-/5 Left hip flexion  LUE: 0/5  Sensation diminished to light touch LUE  Assessment/Plan: 1. Functional deficits secondary to RIght MCA infarct with left hemiplegia which require 3+ hours per day of interdisciplinary therapy in a comprehensive inpatient rehab setting. Physiatrist is providing close team supervision and 24 hour management of active medical problems listed below. Physiatrist and rehab team continue to assess barriers to discharge/monitor patient progress toward functional and medical goals. FIM: Function - Bathing Position: Wheelchair/chair at sink Body parts bathed by patient: Left arm, Chest, Abdomen, Front perineal area, Right upper leg, Left upper leg, Right lower leg Body parts bathed by helper: Left lower leg, Buttocks, Right arm Bathing not applicable: Front perineal area, Buttocks (did not attempt this session) Assist Level: Touching or steadying assistance(Pt > 75%)  Function- Upper Body Dressing/Undressing What is the patient wearing?: Pull over shirt/dress Pull over shirt/dress - Perfomed by patient: Thread/unthread right sleeve Pull over shirt/dress - Perfomed by helper: Thread/unthread left sleeve, Put head through opening, Pull shirt over trunk Assist Level: Touching or steadying assistance(Pt > 75%) Function - Lower Body Dressing/Undressing What is the patient wearing?: Pants, Maryln Manuel, Shoes Position: Education officer, museum at [employer]- Performed by patient: Thread/unthread right pants leg Pants- Performed by helper: Thread/unthread left pants leg, Pull pants up/down Non-skid slipper socks- Performed by helper: Don/doff right sock Socks - Performed by patient: Don/doff right sock, Don/doff left sock Socks - Performed by helper: Don/doff right sock,  Don/doff left sock Shoes - Performed by patient: Don/doff right shoe Shoes - Performed by helper: Don/doff left shoe, Fasten right, Fasten left TED Hose - Performed by helper: Don/doff right TED hose, Don/doff left TED hose Assist for footwear: Dependant Assist for lower body dressing:  (total)  Function - Toileting Toileting activity did not occur: Safety/medical concerns Toileting steps completed by helper: Adjust clothing prior to toileting, Performs perineal hygiene, Adjust clothing after toileting Toileting Assistive Devices: Other (comment) (lift) Assist level: Two helpers  Function - Air cabin crew transfer activity did not occur:  Safety/medical concerns Toilet transfer assistive device: Mechanical lift Mechanical lift: Clarise Cruz Assist level to toilet: 2 helpers Assist level from toilet: 2 helpers  Function - Chair/bed transfer Chair/bed transfer [place]: Squat pivot Chair/bed transfer assist level: Maximal assist (Pt 25 - 49%/lift and lower) Chair/bed transfer assistive device: Armrests Mechanical lift: Stedy Chair/bed transfer details: Verbal cues for technique, Tactile cues for weight shifting, Tactile cues for sequencing, Verbal cues for sequencing  Function - Locomotion: Wheelchair Will patient use wheelchair at discharge?: Yes Type: Manual Max wheelchair distance: 150 ft Assist Level: Supervision or verbal cues Assist Level: Supervision or verbal cues Wheel 150 feet activity did not occur: Safety/medical concerns Assist Level: Supervision or verbal cues Turns around,maneuvers to table,bed, and toilet,negotiates 3% grade,maneuvers on rugs and over doorsills: No Function - Locomotion: Ambulation Ambulation activity did not occur: Safety/medical concerns Assistive device: Walker-hemi, Rail in hallway Max distance: 30 ft Assist level: Maximal assist (Pt 25 - 49%) Walk 10 feet activity did not occur: Safety/medical concerns Assist level: Maximal assist (Pt 25 -  49%) Walk 50 feet with 2 turns activity did not occur: Safety/medical concerns Walk 150 feet activity did not occur: Safety/medical concerns Walk 10 feet on uneven surfaces activity did not occur: Safety/medical concerns  Function - Comprehension Comprehension: Auditory Comprehension assist level: Follows basic conversation/direction with extra time/assistive device  Function - Expression Expression: Verbal Expression assist level: Expresses basic needs/ideas: With extra time/assistive device  Function - Social Interaction Social Interaction assist level: Interacts appropriately 90% of the time - Needs monitoring or encouragement for participation or interaction.  Function - Problem Solving Problem solving assist level: Solves basic 50 - 74% of the time/requires cueing 25 - 49% of the time  Function - Memory Memory assist level: Recognizes or recalls 50 - 74% of the time/requires cueing 25 - 49% of the time Patient normally able to recall (first 3 days only): Current season, Staff names and faces, That he or she is in a hospital  Medical Problem List and Plan: 1.  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  .             -Team conference today please see physician documentation under team conference tab, met with team face-to-face to discuss problems,progress, and goals. Formulized individual treatment plan based on medical history, underlying problem and comorbidities.   PT, OT, SLP 2.  DVT Prophylaxis/Anticoagulation: Subcutaneous heparin for DVT prophylaxis 3. Pain Management/chronic back pain: Oxycodone 5 mg every 6 hours as needed  -continue baclofen prn for cramps/spasms.  LUE shoulder pain improved       4. Mood: Prozac 40 mg daily.  Abilify 5 mg daily 5. Neuropsych: This patient is capable of making decisions on his own behalf.Poor attn: increased ritalin 9m BID 6. Skin/Wound Care: Routine skin checks 7.  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 Gastrostomy PEG tube 12/05/2015. Nocturnal tube feeds recently discontinued. Currently on dysphagia #3/thin.              -regular PEG maintenance, may d/c after 01/19/2016 10. Hypertension.   -dced Hydralazine 10 mg every 8 hours  -continue amlodipine 5 mg daily, lisinopril 30 mg daily with hold parameters Some fluctuations/lability Vitals:   01/14/16 1403 01/15/16 0439  BP: (!) 107/54 (!) 160/62  Pulse: (!) 44 (!) 40  Resp: 12 16  Temp: 97.5 F (36.4 C) 98.4 F (36.9 C)   11. Diabetes mellitus. Lantus insulin  25 units daily at bedtime.  Intake is good  Check blood sugars before meals and at bedtime  Relatively controlled  Recent Labs  01/14/16 1651 01/14/16 2101 01/15/16 0632  GLUCAP 146* 127* 124*   12. Hyperlipidemia. Lipitor 80 mg daily at bedtime 13.Tobacco abuse. Counseling as appropriate 14.  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 ?RLS- pt c/o symptoms BLE prior to CVA   -low dose requip started, no complaints of symptoms at night  -also has prn baclofen as above    LOS (Days) 19 A FACE TO FACE EVALUATION WAS PERFORMED  Jacksyn Beeks E 01/15/2016, 8:37 AM

## 2016-01-15 NOTE — Progress Notes (Signed)
Physical Therapy Session Note  Patient Details  Name: James Holden MRN: [id number] Date of Birth: September 15, 1960  Today's Date: 01/15/2016 PT Individual Time: 0920-1030 PT Individual Time Calculation (min): 70 min    Short Term Goals: Week 2:  PT Short Term Goal 1 (Week 2): Patient will perform sitting balance consistently with Min assist  PT Short Term Goal 1 - Progress (Week 2): Met PT Short Term Goal 2 (Week 2): Patient will initiate gait training,  PT Short Term Goal 2 - Progress (Week 2): Partly met PT Short Term Goal 3 (Week 2): Patient will perform WC mobility with supervision Assist using Hemi technique  PT Short Term Goal 3 - Progress (Week 2): Met PT Short Term Goal 4 (Week 2): Patient will transfer to R with mod assist via squat pivot or SB  PT Short Term Goal 4 - Progress (Week 2): Met PT Short Term Goal 5 (Week 2): Patient will tolerate standing for up to 4 minutes.  PT Short Term Goal 5 - Progress (Week 2): Not met  Skilled Therapeutic Interventions/Progress Updates:     Pt received supine in bed and agreeable to PT. Bed mobility with min assist to roll to L and min-mod assist for supine>sit with facilitation at trunk and hips.   Dressing lower body in supine with mod assist using bridge to pull pants to waist as well as mod cues for L hip stability. Upper body dressing in sitting with mod-max assist to dress L UE first and proper technique to lace through sleeve.    Squat pivot transfers with min assist to the R. And mod assist to the L. X 3 each throughout treatment with cues for improved anterior weight shifting and use of LLE.   WC mobility 26f, and 2224fwith supervision assist. Weave through 5 cones with supervision Assist and mod cues for attention to the L.   Gait training 106f 2 with max Assist +2 with WC follow; PT provided constant manual facilitation for limb advancement and to reduce minor flexor synergy tone. Patient noted to roll ankle into inversion  due to flexor synergy tone once throughout gait, and reports feeling pop in ankle. No increase in pain noted after rest break.   Patient returned to room and performed uphill SB transfer to high back WC with mod assist from PT. Left sitting with all needs met in WC.     Therapy Documentation Precautions:  Precautions Precautions: Fall Precaution Comments: L hemiplegia, R hemicraniotomy  Required Braces or Orthoses: Other Brace/Splint Other Brace/Splint: Helmet when OOB Restrictions Weight Bearing Restrictions: No General:   Vital Signs:  Pain: Pain Assessment Pain Assessment: Faces Pain Score: 0-No pain Faces Pain Scale: Hurts a little bit Pain Type: Acute pain Pain Location: Wrist Pain Orientation: Left Pain Descriptors / Indicators: Discomfort Pain Onset: With Activity Patients Stated Pain Goal: 2 Pain Intervention(s): Repositioned   See Function Navigator for Current Functional Status.   Therapy/Group: Individual Therapy  AusLorie Phenix/29/2017, 12:44 PM

## 2016-01-15 NOTE — Progress Notes (Signed)
Nutrition Follow-up  DOCUMENTATION CODES:   Not applicable  INTERVENTION:  Continue Glucerna Shake po BID, each supplement provides 220 kcal and 10 grams of protein  Provide nourishment snacks in between meals.  Continue free water flushes of 50 ml BID via PEG for tube maintenance care.  Encourage adequate PO intake.   NUTRITION DIAGNOSIS:   Increased nutrient needs related to acute illness as evidenced by estimated needs; ongoing  GOAL:   Patient will meet greater than or equal to 90% of their needs; progressed  MONITOR:   PO intake, Supplement acceptance, Labs, Weight trends, Skin, I & O's  REASON FOR ASSESSMENT:   Malnutrition Screening Tool    ASSESSMENT:   Pt admitted to CIR with Left hemiplegia and dysphagia secondary to right MCA infarct with craniotomy 11/15/2015.  Meal completion has been varied from 25-100%, with most po intake at least 50%. Pt currently has Glucerna Shake ordered and has been consuming them. RD to continue with current orders. Pt encouraged to eat his foods at meals and to drink his supplements.   Diet Order:  Diet Carb Modified Fluid consistency: Thin; Room service appropriate? Yes  Skin:   (Incision on head right)  Last BM:  11/27  Height:   Ht Readings from Last 1 Encounters:  12/27/15 5\' 10"  (1.778 m)    Weight:   Wt Readings from Last 1 Encounters:  01/15/16 191 lb 14.4 oz (87 kg)    Ideal Body Weight:  75.5 kg  BMI:  Body mass index is 27.53 kg/m.  Estimated Nutritional Needs:   Kcal:  2100-2300  Protein:  100-115 grams  Fluid:  2.1-2.3 L  EDUCATION NEEDS:   Education needs addressed  Roslyn SmilingStephanie Beadie Matsunaga, MS, RD, LDN Pager # [phone] After hours/ weekend pager # [phone]

## 2016-01-15 NOTE — Progress Notes (Signed)
Speech Language Pathology Daily Session Note  Patient Details  Name: James Holden MRN: [id number] Date of Birth: 12/20/1960  Today's Date: 01/15/2016 SLP Individual Time: 0800-0900 SLP Individual Time Calculation (min): 60 min   Short Term Goals: Week 3: SLP Short Term Goal 1 (Week 3): Patient will consume current diet without overt s/s of aspiration with Mod I for use of swallowing compensatory strategies.  SLP Short Term Goal 2 (Week 3): Patient will demonstrate efficient mastication and oral clearance with trials of regular textures over 2 sessions prior to upgrade with supervision verbal cues.  SLP Short Term Goal 3 (Week 3): Patient will maintain eye contact at midline to conversation partner during functional conversation/task for ~30 seconds in 25% of opportunities with Mod A verbal and question cues.  SLP Short Term Goal 4 (Week 3): Patient will perform visual scanning tasks to locate items at midline with min A multimodal cues in 50% of opportunities.  SLP Short Term Goal 5 (Week 3): Patient will demonstrate basic problem solving in functional tasks with mod A verbal and question cues.  Skilled Therapeutic Interventions:  Pt was seen for skilled ST targeting goals for dysphagia and cognition.  Pt consumed a trial meal tray of regular textures and thin liquids to continue working towards diet progression.  Pt consumed advanced textures with supervision use of swallowing precautions and no overt s/s of aspiration with solids or liquids.  Pt was able to clear solids from the oral cavity without difficulty.  Recommend advancing pt's diet to regular, thin with ongoing full supervision for use of swallowing precautions.  Pt requested to use the bathroom appropriately and was continent of bowel and bladder.  He recalled donning helmet prior to transfer but needed min verbal cues for safe hand placement when using Stedy lift.  Pt was returned to bed with call bell within reach and bed alarm  set.  Continue per current plan of care.        Function:  Eating Eating   Modified Consistency Diet: Yes Eating Assist Level: Supervision or verbal cues;Set up assist for   Eating Set Up Assist For: Opening containers       Cognition Comprehension Comprehension assist level: Follows basic conversation/direction with extra time/assistive device  Expression   Expression assist level: Expresses basic needs/ideas: With no assist  Social Interaction Social Interaction assist level: Interacts appropriately 75 - 89% of the time - Needs redirection for appropriate language or to initiate interaction.  Problem Solving Problem solving assist level: Solves basic 50 - 74% of the time/requires cueing 25 - 49% of the time  Memory Memory assist level: Recognizes or recalls 50 - 74% of the time/requires cueing 25 - 49% of the time    Pain Pain Assessment Pain Assessment: Faces Pain Score: 0-No pain Faces Pain Scale: Hurts even more Pain Type: Acute pain Pain Location: Hip Pain Orientation: Left Pain Descriptors / Indicators: Aching Pain Onset: Gradual Patients Stated Pain Goal: 2 Pain Intervention(s): RN made aware  Therapy/Group: Individual Therapy  Jabre Heo, Melanee SpryNicole L 01/15/2016, 11:32 AM

## 2016-01-15 NOTE — Progress Notes (Signed)
Social Work Patient ID: James Holden, male   DOB: 09/03/1960, 55 y.o.   MRN: [id number]  Spoke with wife via telephone to discuss discharge extension due to pt's progress and doing well. Team feels they can get him at  A more consistent min assist level. Pt aware and will stay not opposed. Will check with [employer] and make sure approves this. Will have family come in next week to begin family education.

## 2016-01-16 ENCOUNTER — Inpatient Hospital Stay (HOSPITAL_COMMUNITY): Payer: BLUE CROSS/BLUE SHIELD

## 2016-01-16 ENCOUNTER — Inpatient Hospital Stay (HOSPITAL_COMMUNITY): Payer: BLUE CROSS/BLUE SHIELD | Admitting: Speech Pathology

## 2016-01-16 ENCOUNTER — Inpatient Hospital Stay (HOSPITAL_COMMUNITY): Payer: BLUE CROSS/BLUE SHIELD | Admitting: Physical Therapy

## 2016-01-16 ENCOUNTER — Inpatient Hospital Stay (HOSPITAL_COMMUNITY): Payer: BLUE CROSS/BLUE SHIELD | Admitting: Occupational Therapy

## 2016-01-16 LAB — GLUCOSE, CAPILLARY
GLUCOSE-CAPILLARY: 122 mg/dL — AB (ref 65–99)
GLUCOSE-CAPILLARY: 141 mg/dL — AB (ref 65–99)
GLUCOSE-CAPILLARY: 167 mg/dL — AB (ref 65–99)
Glucose-Capillary: 114 mg/dL — ABNORMAL HIGH (ref 65–99)

## 2016-01-16 NOTE — Progress Notes (Addendum)
Physical Therapy Note  Patient Details  Name: James Holden MRN: [id number] Date of Birth: 10/13/1960 Today's Date: 01/16/2016  1418-1500, 42 min, inidividual tx Pain: none reported  Resting hand splint removed from L hand while pt in bed.  Squat pivot to R bed> w/c.  Trial of Walk On AFO with pretibial component for gait in parallel bars.  Gait with mod assist for placmeent of L foot, knee control and preventing L ankle from rolling over, Giv-Mohr sling on LUE for subluxation.  Gait forward/backward.  Pt demonstrated L hip flexion and knee extension during swing phase, with excessive adduction; had miinmal activation of L hamstrings for retrograde walking.  James Holden, NT took pt back to room with quick release belt applied.   James Holden 01/16/2016, 2:55 PM

## 2016-01-16 NOTE — Progress Notes (Signed)
Subjective/Complaints: LUE pain is better  ROS: Denies CP, SOB, N/V/D.   Objective: Vital Signs: Blood pressure (!) 122/58, pulse (!) 46, temperature 98.7 F (37.1 C), temperature source Oral, resp. rate 18, height 5' 10"  (1.778 m), weight 87.3 kg (192 lb 8 oz), SpO2 99 %. No results found. Results for orders placed or performed during the hospital encounter of 12/27/15 (from the past 72 hour(s))  Glucose, capillary     Status: Abnormal   Collection Time: 01/13/16 11:57 AM  Result Value Ref Range   Glucose-Capillary 138 (H) 65 - 99 mg/dL  Basic metabolic panel     Status: Abnormal   Collection Time: 01/13/16 12:03 PM  Result Value Ref Range   Sodium 134 (L) 135 - 145 mmol/L   Potassium 4.3 3.5 - 5.1 mmol/L   Chloride 100 (L) 101 - 111 mmol/L   CO2 26 22 - 32 mmol/L   Glucose, Bld 144 (H) 65 - 99 mg/dL   BUN 12 6 - 20 mg/dL   Creatinine, Ser 0.98 0.61 - 1.24 mg/dL   Calcium 9.2 8.9 - 10.3 mg/dL   GFR calc non Af Amer >60 >60 mL/min   GFR calc Af Amer >60 >60 mL/min    Comment: (NOTE) The eGFR has been calculated using the CKD EPI equation. This calculation has not been validated in all clinical situations. eGFR's persistently <60 mL/min signify possible Chronic Kidney Disease.    Anion gap 8 5 - 15  CBC with Differential/Platelet     Status: Abnormal   Collection Time: 01/13/16 12:03 PM  Result Value Ref Range   WBC 6.9 4.0 - 10.5 K/uL   RBC 4.12 (L) 4.22 - 5.81 MIL/uL   Hemoglobin 11.1 (L) 13.0 - 17.0 g/dL   HCT 35.3 (L) 39.0 - 52.0 %   MCV 85.7 78.0 - 100.0 fL   MCH 26.9 26.0 - 34.0 pg   MCHC 31.4 30.0 - 36.0 g/dL   RDW 14.8 11.5 - 15.5 %   Platelets 181 150 - 400 K/uL   Neutrophils Relative % 54 %   Neutro Abs 3.7 1.7 - 7.7 K/uL   Lymphocytes Relative 36 %   Lymphs Abs 2.5 0.7 - 4.0 K/uL   Monocytes Relative 7 %   Monocytes Absolute 0.5 0.1 - 1.0 K/uL   Eosinophils Relative 3 %   Eosinophils Absolute 0.2 0.0 - 0.7 K/uL   Basophils Relative 0 %   Basophils Absolute 0.0 0.0 - 0.1 K/uL  Glucose, capillary     Status: Abnormal   Collection Time: 01/13/16  4:30 PM  Result Value Ref Range   Glucose-Capillary 101 (H) 65 - 99 mg/dL  Glucose, capillary     Status: Abnormal   Collection Time: 01/13/16  8:55 PM  Result Value Ref Range   Glucose-Capillary 182 (H) 65 - 99 mg/dL  Glucose, capillary     Status: Abnormal   Collection Time: 01/14/16  6:44 AM  Result Value Ref Range   Glucose-Capillary 136 (H) 65 - 99 mg/dL  Glucose, capillary     Status: Abnormal   Collection Time: 01/14/16 12:05 PM  Result Value Ref Range   Glucose-Capillary 118 (H) 65 - 99 mg/dL  Glucose, capillary     Status: Abnormal   Collection Time: 01/14/16  4:51 PM  Result Value Ref Range   Glucose-Capillary 146 (H) 65 - 99 mg/dL  Glucose, capillary     Status: Abnormal   Collection Time: 01/14/16  9:01 PM  Result Value  Ref Range   Glucose-Capillary 127 (H) 65 - 99 mg/dL   Comment 1 Notify RN   Glucose, capillary     Status: Abnormal   Collection Time: 01/15/16  6:32 AM  Result Value Ref Range   Glucose-Capillary 124 (H) 65 - 99 mg/dL   Comment 1 Notify RN   Glucose, capillary     Status: Abnormal   Collection Time: 01/15/16 12:01 PM  Result Value Ref Range   Glucose-Capillary 178 (H) 65 - 99 mg/dL  Glucose, capillary     Status: Abnormal   Collection Time: 01/15/16  4:27 PM  Result Value Ref Range   Glucose-Capillary 107 (H) 65 - 99 mg/dL  Glucose, capillary     Status: Abnormal   Collection Time: 01/15/16 10:00 PM  Result Value Ref Range   Glucose-Capillary 150 (H) 65 - 99 mg/dL  Glucose, capillary     Status: Abnormal   Collection Time: 01/16/16  6:39 AM  Result Value Ref Range   Glucose-Capillary 122 (H) 65 - 99 mg/dL     Physical Exam: General: NAD. Well-developed.  ENT- former trach site CDI,  Psych: Mood and affect are appropriate Head: craniectomy site without swelling Heart: RRR. No JVD Lungs: CTA. Unlabored. Abdomen: +BS, soft. PEG  site CDI Musc: no edema. No tenderness. Skin: no rash. Warm and dry. Neurologic:  Motor strength is 5/5 in Right  deltoid, bicep, tricep, grip, hip flexor, knee extensors, ankle dorsiflexor and plantar flexor 2-/5 Left hip flexion  LUE: 0/5  Sensation diminished to light touch LUE  Assessment/Plan: 1. Functional deficits secondary to RIght MCA infarct with left hemiplegia which require 3+ hours per day of interdisciplinary therapy in a comprehensive inpatient rehab setting. Physiatrist is providing close team supervision and 24 hour management of active medical problems listed below. Physiatrist and rehab team continue to assess barriers to discharge/monitor patient progress toward functional and medical goals. FIM: Function - Bathing Position: Wheelchair/chair at sink Body parts bathed by patient: Left arm, Chest, Abdomen, Front perineal area, Right upper leg, Left upper leg, Right lower leg Body parts bathed by helper: Left lower leg, Buttocks, Right arm Bathing not applicable: Front perineal area, Buttocks (did not attempt this session) Assist Level: Touching or steadying assistance(Pt > 75%)  Function- Upper Body Dressing/Undressing What is the patient wearing?: Pull over shirt/dress Pull over shirt/dress - Perfomed by patient: Thread/unthread right sleeve Pull over shirt/dress - Perfomed by helper: Thread/unthread left sleeve, Put head through opening, Pull shirt over trunk Assist Level: Touching or steadying assistance(Pt > 75%) Function - Lower Body Dressing/Undressing What is the patient wearing?: Pants, Maryln Manuel, Shoes Position: Education officer, museum at [employer]- Performed by patient: Thread/unthread right pants leg Pants- Performed by helper: Thread/unthread left pants leg, Pull pants up/down Non-skid slipper socks- Performed by helper: Don/doff right sock Socks - Performed by patient: Don/doff right sock, Don/doff left sock Socks - Performed by helper: Don/doff right sock,  Don/doff left sock Shoes - Performed by patient: Don/doff right shoe Shoes - Performed by helper: Don/doff left shoe, Fasten right, Fasten left TED Hose - Performed by helper: Don/doff right TED hose, Don/doff left TED hose Assist for footwear: Dependant Assist for lower body dressing:  (total)  Function - Toileting Toileting activity did not occur: Safety/medical concerns Toileting steps completed by helper: Adjust clothing prior to toileting, Performs perineal hygiene, Adjust clothing after toileting Toileting Assistive Devices: Other (comment) (lift) Assist level: Two helpers  Function - Air cabin crew transfer activity did not occur: Safety/medical  concerns Toilet transfer assistive device: Mechanical lift Mechanical lift: Clarise Cruz Assist level to toilet: 2 helpers Assist level from toilet: 2 helpers  Function - Chair/bed transfer Chair/bed transfer Gibson: Stand pivot Chair/bed transfer assist level: Touching or steadying assistance (Pt > 75%) Chair/bed transfer assistive device: Armrests Mechanical lift: Stedy Chair/bed transfer details: Verbal cues for technique, Tactile cues for weight shifting, Tactile cues for sequencing, Verbal cues for sequencing  Function - Locomotion: Wheelchair Will patient use wheelchair at discharge?: Yes Type: Manual Max wheelchair distance: 225 Assist Level: Supervision or verbal cues Assist Level: Supervision or verbal cues Wheel 150 feet activity did not occur: Safety/medical concerns Assist Level: Supervision or verbal cues Turns around,maneuvers to table,bed, and toilet,negotiates 3% grade,maneuvers on rugs and over doorsills: No Function - Locomotion: Ambulation Ambulation activity did not occur: Safety/medical concerns Assistive device: Walker-hemi Max distance: 55f  Assist level: 2 helpers Walk 10 feet activity did not occur: Safety/medical concerns Assist level: 2 helpers Walk 50 feet with 2 turns activity did not occur:  Safety/medical concerns Walk 150 feet activity did not occur: Safety/medical concerns Walk 10 feet on uneven surfaces activity did not occur: Safety/medical concerns  Function - Comprehension Comprehension: Auditory Comprehension assist level: Follows basic conversation/direction with extra time/assistive device  Function - Expression Expression: Verbal Expression assist level: Expresses basic needs/ideas: With no assist  Function - Social Interaction Social Interaction assist level: Interacts appropriately 75 - 89% of the time - Needs redirection for appropriate language or to initiate interaction.  Function - Problem Solving Problem solving assist level: Solves basic 50 - 74% of the time/requires cueing 25 - 49% of the time  Function - Memory Memory assist level: Recognizes or recalls 50 - 74% of the time/requires cueing 25 - 49% of the time Patient normally able to recall (first 3 days only): Current season, Staff names and faces, That he or she is in a hospital  Medical Problem List and Plan: 1.  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  .             -discussed extension with pt   PT, OT, SLP 2.  DVT Prophylaxis/Anticoagulation: Subcutaneous heparin for DVT prophylaxis 3. Pain Management/chronic back pain: Oxycodone 5 mg every 6 hours as needed  -continue baclofen prn for cramps/spasms.  Left hand and wrist pain improved with splint       4. Mood: Prozac 40 mg daily.  Abilify 5 mg daily 5. Neuropsych: This patient is capable of making decisions on his own behalf.Poor attn: increased ritalin 183mBID 6. Skin/Wound Care: Routine skin checks 7. 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 Gastrostomy PEG tube 12/05/2015. Nocturnal tube feeds recently discontinued. Currently on dysphagia #3/thin.              -regular PEG maintenance, may d/c  after 01/19/2016 10. Hypertension.   -dced Hydralazine 10 mg every 8 hours  -continue amlodipine 5 mg daily, lisinopril 30 mg daily with hold parameters Some fluctuations/lability Vitals:   01/15/16 1300 01/16/16 0530  BP: (!) 121/59 (!) 122/58  Pulse: (!) 40 (!) 46  Resp: 16 18  Temp: 98.6 F (37 C) 98.7 F (37.1 C)   11. Diabetes mellitus. Lantus insulin 25 units daily at bedtime.  Intake is good  Check blood sugars before meals and at bedtime  Relatively controlled   Recent Labs  01/15/16 1627 01/15/16 2200 01/16/16 0639  GLUCAP 107* 150*  122*   12. Hyperlipidemia. Lipitor 80 mg daily at bedtime 13.Tobacco abuse. Counseling as appropriate 14.  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 ?RLS- pt c/o symptoms BLE prior to CVA   -low dose requip started, no complaints of symptoms at night  -also has prn baclofen as above    LOS (Days) 20 A FACE TO FACE EVALUATION WAS PERFORMED  Erman Thum E 01/16/2016, 7:41 AM

## 2016-01-16 NOTE — Progress Notes (Signed)
Occupational Therapy Session Note  Patient Details  Name: James GentaJarvis George Harbach MRN: [id number] Date of Birth: 05/23/1960  Today's Date: 01/16/2016 OT Individual Time: 1001-1105 OT Individual Time Calculation (min): 64 min     Short Term Goals: Week 3:  OT Short Term Goal 1 (Week 3): Pt will visually scan left of midline to locate grooming, bathing, or feeding items with no more than mod instructional cueing.   OT Short Term Goal 2 (Week 3): Pt will transfer stand pivot to the walk-in shower with supervision.   OT Short Term Goal 3 (Week 3): Pt will complete LB dressing with mod assist to donn pullover pants only. OT Short Term Goal 3 - Progress (Week 3): Other (comment) OT Short Term Goal 4 (Week 3): Pt will complete toilet transfer with mod assist stand pivot to elevated toilet.   OT Short Term Goal 5 (Week 3): Pt will use the LUE as a stabilizer with selfcare tasks with max assist.   Skilled Therapeutic Interventions/Progress Updates:    Pt completed transfer to the wheelchair and then to the therapy mat with min assist squat pivot and mod assist stand pivot during session.  Noted inversion of the left foot when attempting stand pivot secondary to increased tone.  Applied Bioness H200 to left hand in order to work on digit extension.  Pt with increased left wrist pain after a couple of mins secondary to hand unit being too small.  Removed Bioness and applied NMES through tens unit to digit extensors.  Pt tolerated 10 mins of active stimulation with only slight pain in the wrist and above the elbow.  Intensity set on level 38 with PPS at 50 and pulse width at 300.  On time/off 10 second intervals.  Completed joint mobilizations to the wrist, carpals, and MPs after 5 mins of stimulation.  Pt again encouraged to help extend digits as stimulation was active.  Pt with increased hip pain on the right while sitting on mat.  Transferred back to wheelchair for comfort.  Attempted gentle weightbearing over  the LUE while sitting on the mat but pt too uncomfortable to maintain attention secondary to pain.  Took pt back to room at end of session and left up in wheelchair with safety belt in place, resting hand splint on the left hand, and half lap tray positioning the LUE as well.   Therapy Documentation Precautions:  Precautions Precautions: Fall Precaution Comments: L hemiplegia, R hemicraniotomy  Required Braces or Orthoses: Other Brace/Splint Other Brace/Splint: Helmet when OOB Restrictions Weight Bearing Restrictions: No  Pain: Pain Assessment Pain Assessment: Faces Faces Pain Scale: Hurts little more Pain Type: Acute pain Pain Location: Hand Pain Orientation: Left Pain Descriptors / Indicators: Aching Pain Onset: With Activity Pain Intervention(s): Repositioned ADL: See Function Navigator for Current Functional Status.   Therapy/Group: Individual Therapy  Kamarri Lovvorn OTR/L 01/16/2016, 12:37 PM

## 2016-01-16 NOTE — Progress Notes (Signed)
Patient A/O, no noted distress. Denies pain. LUE/LLE flaccid. Educated patient on medications, call light system, and safety. Cont bladder throughout the night. Patient slept well throughout the night.

## 2016-01-16 NOTE — Progress Notes (Signed)
Speech Language Pathology Daily Session Note  Patient Details  Name: James Holden MRN: [id number] Date of Birth: 06/15/1960  Today's Date: 01/16/2016 SLP Individual Time: 1106-1201 SLP Individual Time Calculation (min): 55 min   Short Term Goals: Week 3: SLP Short Term Goal 1 (Week 3): Patient will consume current diet without overt s/s of aspiration with Mod I for use of swallowing compensatory strategies.  SLP Short Term Goal 2 (Week 3): Patient will demonstrate efficient mastication and oral clearance with trials of regular textures over 2 sessions prior to upgrade with supervision verbal cues.  SLP Short Term Goal 3 (Week 3): Patient will maintain eye contact at midline to conversation partner during functional conversation/task for ~30 seconds in 25% of opportunities with Mod A verbal and question cues.  SLP Short Term Goal 4 (Week 3): Patient will perform visual scanning tasks to locate items at midline with min A multimodal cues in 50% of opportunities.  SLP Short Term Goal 5 (Week 3): Patient will demonstrate basic problem solving in functional tasks with mod A verbal and question cues.  Skilled Therapeutic Interventions: Pt was seen for skilled ST targeting goals for dysphagia and cognition.  Pt requested to brush his teeth upon therapist's arrival and needed overall min assist verbal cues to locate items on the left side of the sink.  Pt asked for assistance appropriately for locating items.  Pt was also able to visually scan between left and right environment during a basic puzzle task with min assist verbal cues for use of scanning techniques.  Pt consumed regular textures and thin liquids with distant supervision for use of swallowing precautions.  No overt s/s of aspiration were evident with solids or liquids.  Recommend that pt continue on current diet with full supervision for use of precautions.  Pt was returned back to room and left in bed with call bell within reach and bed  alarm set.  Continue per current plan of care.       Function:  Eating Eating                 Cognition Comprehension Comprehension assist level: Follows basic conversation/direction with no assist  Expression   Expression assist level: Expresses basic needs/ideas: With no assist  Social Interaction Social Interaction assist level: Interacts appropriately 90% of the time - Needs monitoring or encouragement for participation or interaction.  Problem Solving Problem solving assist level: Solves basic 50 - 74% of the time/requires cueing 25 - 49% of the time  Memory Memory assist level: Recognizes or recalls 75 - 89% of the time/requires cueing 10 - 24% of the time    Pain Pain Assessment Pain Assessment: Faces Faces Pain Scale: Hurts a little bit Pain Type: Acute pain Pain Location: Buttocks Pain Descriptors / Indicators: Aching Pain Intervention(s): Repositioned  Therapy/Group: Individual Therapy  Ediel Unangst, Melanee SpryNicole L 01/16/2016, 12:01 PM

## 2016-01-16 NOTE — Progress Notes (Signed)
Physical Therapy Session Note  Patient Details  Name: James Holden MRN: [id number] Date of Birth: 04-Sep-1960  Today's Date: 01/16/2016 PT Individual Time: 0806-0900 PT Individual Time Calculation (min): 54 min    Short Term Goals: Week 2:  PT Short Term Goal 1 (Week 2): Patient will perform sitting balance consistently with Min assist  PT Short Term Goal 1 - Progress (Week 2): Met PT Short Term Goal 2 (Week 2): Patient will initiate gait training,  PT Short Term Goal 2 - Progress (Week 2): Partly met PT Short Term Goal 3 (Week 2): Patient will perform WC mobility with supervision Assist using Hemi technique  PT Short Term Goal 3 - Progress (Week 2): Met PT Short Term Goal 4 (Week 2): Patient will transfer to R with mod assist via squat pivot or SB  PT Short Term Goal 4 - Progress (Week 2): Met PT Short Term Goal 5 (Week 2): Patient will tolerate standing for up to 4 minutes.  PT Short Term Goal 5 - Progress (Week 2): Not met   Skilled Therapeutic Interventions/Progress Updates:     Pt received supine in bed and reports just getting meal. Requested extra time to finish easting. PT returned after 5 minutes, and patient agreeable to PT.   Bed mobility instructed by PT including roll to L with supervision Assist sidelying to sitting with mod assist with stabilization to the trunk and pelvis.    Dressing instructed by PT with max assist for proper set up using bridge to pull pants to waist as well as donning shirt in sitting with max assist for proper sequencing.   WC mobility through hall for 139f and 1461fwith supervision Assist with min-mod cues for awareness of L side, patient demonstrated decreased cervical rotation compared to previous session.   Transfer training instructed by PT for Lateral scoot to L across SB. Mod assist x 3. With mod cues from PT at trunk to facilitate lateral pelvic movement. Lateral scooting on Bed x 5 to R and L with min assist From PT.   Bed  mobility to return to bed with mod assist from PT.   Patient left in bed with call bell in reach.   Therapy Documentation Precautions:  Precautions Precautions: Fall Precaution Comments: L hemiplegia, R hemicraniotomy  Required Braces or Orthoses: Other Brace/Splint Other Brace/Splint: Helmet when OOB Restrictions Weight Bearing Restrictions: No General:   Vital Signs: Therapy Vitals Temp: 98.7 F (37.1 C) Temp Source: Oral Pulse Rate: (!) 46 Resp: 18 BP: 128/62 Patient Position (if appropriate): Lying Oxygen Therapy SpO2: 99 % O2 Device: Not Delivered Pain:   3/10 Bilateral hips.    See Function Navigator for Current Functional Status.   Therapy/Group: Individual Therapy  AuLorie Phenix1/30/2017, 9:02 AM

## 2016-01-17 ENCOUNTER — Inpatient Hospital Stay (HOSPITAL_COMMUNITY): Payer: BLUE CROSS/BLUE SHIELD | Admitting: Occupational Therapy

## 2016-01-17 ENCOUNTER — Inpatient Hospital Stay (HOSPITAL_COMMUNITY): Payer: BLUE CROSS/BLUE SHIELD | Admitting: Physical Therapy

## 2016-01-17 ENCOUNTER — Inpatient Hospital Stay (HOSPITAL_COMMUNITY): Payer: BLUE CROSS/BLUE SHIELD | Admitting: Speech Pathology

## 2016-01-17 LAB — GLUCOSE, CAPILLARY
GLUCOSE-CAPILLARY: 125 mg/dL — AB (ref 65–99)
Glucose-Capillary: 176 mg/dL — ABNORMAL HIGH (ref 65–99)
Glucose-Capillary: 141 mg/dL — ABNORMAL HIGH (ref 65–99)
Glucose-Capillary: 158 mg/dL — ABNORMAL HIGH (ref 65–99)

## 2016-01-17 MED ORDER — HEPARIN SODIUM (PORCINE) 5000 UNIT/ML IJ SOLN
5000.0000 [IU] | Freq: Three times a day (TID) | INTRAMUSCULAR | Status: DC
  Administered 2016-01-18 – 2016-01-28 (×32): 5000 [IU] via SUBCUTANEOUS
  Filled 2016-01-17 (×32): qty 1

## 2016-01-17 NOTE — Progress Notes (Addendum)
Speech Language Pathology Weekly Progress and Session Note  Patient Details  Name: Hakeen Shipes MRN: [id number] Date of Birth: 11-25-1960  Beginning of progress report period: January 10, 2016   End of progress report period: January 17, 2016  Today's Date: 01/17/2016 SLP Individual Time: 1004-1100 SLP Individual Time Calculation (min): 56 min   Short Term Goals: Week 3: SLP Short Term Goal 1 (Week 3): Patient will consume current diet without overt s/s of aspiration with Mod I for use of swallowing compensatory strategies.  SLP Short Term Goal 1 - Progress (Week 3): Progressing toward goal SLP Short Term Goal 2 (Week 3): Patient will demonstrate efficient mastication and oral clearance with trials of regular textures over 2 sessions prior to upgrade with supervision verbal cues.  SLP Short Term Goal 2 - Progress (Week 3): Met SLP Short Term Goal 3 (Week 3): Patient will maintain eye contact at midline to conversation partner during functional conversation/task for ~30 seconds in 25% of opportunities with Mod A verbal and question cues.  SLP Short Term Goal 3 - Progress (Week 3): Met SLP Short Term Goal 4 (Week 3): Patient will perform visual scanning tasks to locate items at midline with min A multimodal cues in 50% of opportunities.  SLP Short Term Goal 4 - Progress (Week 3): Met SLP Short Term Goal 5 (Week 3): Patient will demonstrate basic problem solving in functional tasks with mod A verbal and question cues. SLP Short Term Goal 5 - Progress (Week 3): Met    New Short Term Goals: Week 4: SLP Short Term Goal 1 (Week 4): Patient will consume regular textures and thin liquids without overt s/s of aspiration with Mod I for use of swallowing compensatory strategies.  SLP Short Term Goal 2 (Week 4): Patient will maintain eye contact at midline to conversation partner during functional conversation/task for >1 minute in >50% of opportunities with Mod A verbal and question cues.  SLP  Short Term Goal 3 (Week 4): Patient will perform visual scanning tasks to locate items at midline with min A multimodal cues in 75% of opportunities.  SLP Short Term Goal 4 (Week 4): Patient will demonstrate basic problem solving in functional tasks with min A verbal and question cues.  Weekly Progress Updates: Pt has made functional gains this reporting period and has met 4 out of 5 short term goals.  Pt's diet has been upgraded to regular textures and thin liquids with continuation of full supervision during meals due to cognition.  Pt is demonstrating improved visual scanning to midline and to the left of midline with min-mod assist cues for use of scanning techniques.  Pt is also presenting with improved attention and awareness of his deficits but continues to need assistance for basic problem solving and safety.  As a result, pt would continue to benefit from skilled ST while inpatient in order to maximize functional independence and reduce burden of care prior to discharge.  No family has been present for education this reporting period.  Will continue efforts closer to discharge.  Continue to recommend 24/7 supervision in addition to [place] follow up at next level of care.       Intensity: Minumum of 1-2 x/day, 30 to 90 minutes Frequency: 3 to 5 out of 7 days Duration/Length of Stay: 12/5 Treatment/Interventions: Cognitive remediation/compensation;Cueing hierarchy;Functional tasks;Patient/family education;Therapeutic Activities;Internal/external aids;Dysphagia/aspiration precaution training;Environmental controls;Speech/Language facilitation   Daily Session  Skilled Therapeutic Interventions: Pt was seen for skilled ST targeting cognitive goals.  Pt received at  nursing station with complaints of left hand pain.  RN made aware.  Pt also reported poor sleep last night and appeared more fatigued today in comparison to previous therapy sessions.  Pt needed max assist for visual scanning, attention to  task, and task organization during a basic 4 step picture sequencing activity.  Pt with improved alertness during a novel card game.  Pt needed mod assist verbal cues to attend to cards on his left side as well as to plan and execute a problem solving strategy during the abovementioned task.  Pt removed helmet briefly during today's therapy session and needed mod assist to correctly donn again due to poor error awareness.  Pt was returned to room and transferred back to bed.  Pt left in bed with bed alarm set and call bell within reach.  Goals updated on this date to reflect current progress and plan of care.      Function:   Eating Eating                 Cognition Comprehension Comprehension assist level: Follows basic conversation/direction with no assist  Expression   Expression assist level: Expresses basic needs/ideas: With no assist  Social Interaction Social Interaction assist level: Interacts appropriately 90% of the time - Needs monitoring or encouragement for participation or interaction.  Problem Solving Problem solving assist level: Solves basic 50 - 74% of the time/requires cueing 25 - 49% of the time  Memory Memory assist level: Recognizes or recalls 75 - 89% of the time/requires cueing 10 - 24% of the time   General    Pain Pain Assessment Pain Assessment: Faces Pain Score: 0-No pain Faces Pain Scale: Hurts little more Pain Type: Acute pain Pain Location: Hand Pain Orientation: Left Pain Descriptors / Indicators: Aching Pain Intervention(s): RN made aware  Therapy/Group: Individual Therapy  Drishti Pepperman, Selinda Orion 01/17/2016, 11:49 AM

## 2016-01-17 NOTE — Progress Notes (Signed)
Subjective/Complaints: Feeling very well. Sleeping at night. Denies pain  ROS: pt denies nausea, vomiting, diarrhea, cough, shortness of breath or chest pain    Objective: Vital Signs: Blood pressure (!) 133/53, pulse (!) 40, temperature 97.5 F (36.4 C), temperature source Oral, resp. rate 18, height 5\' 10"  (1.778 m), weight 65.6 kg (144 lb 9.6 oz), SpO2 98 %. No results found. Results for orders placed or performed during the hospital encounter of 12/27/15 (from the past 72 hour(s))  Glucose, capillary     Status: Abnormal   Collection Time: 01/14/16 12:05 PM  Result Value Ref Range   Glucose-Capillary 118 (H) 65 - 99 mg/dL  Glucose, capillary     Status: Abnormal   Collection Time: 01/14/16  4:51 PM  Result Value Ref Range   Glucose-Capillary 146 (H) 65 - 99 mg/dL  Glucose, capillary     Status: Abnormal   Collection Time: 01/14/16  9:01 PM  Result Value Ref Range   Glucose-Capillary 127 (H) 65 - 99 mg/dL   Comment 1 Notify RN   Glucose, capillary     Status: Abnormal   Collection Time: 01/15/16  6:32 AM  Result Value Ref Range   Glucose-Capillary 124 (H) 65 - 99 mg/dL   Comment 1 Notify RN   Glucose, capillary     Status: Abnormal   Collection Time: 01/15/16 12:01 PM  Result Value Ref Range   Glucose-Capillary 178 (H) 65 - 99 mg/dL  Glucose, capillary     Status: Abnormal   Collection Time: 01/15/16  4:27 PM  Result Value Ref Range   Glucose-Capillary 107 (H) 65 - 99 mg/dL  Glucose, capillary     Status: Abnormal   Collection Time: 01/15/16 10:00 PM  Result Value Ref Range   Glucose-Capillary 150 (H) 65 - 99 mg/dL  Glucose, capillary     Status: Abnormal   Collection Time: 01/16/16  6:39 AM  Result Value Ref Range   Glucose-Capillary 122 (H) 65 - 99 mg/dL  Glucose, capillary     Status: Abnormal   Collection Time: 01/16/16 11:56 AM  Result Value Ref Range   Glucose-Capillary 141 (H) 65 - 99 mg/dL   Comment 1 Notify RN   Glucose, capillary     Status:  Abnormal   Collection Time: 01/16/16  3:55 PM  Result Value Ref Range   Glucose-Capillary 114 (H) 65 - 99 mg/dL   Comment 1 Notify RN   Glucose, capillary     Status: Abnormal   Collection Time: 01/16/16  8:55 PM  Result Value Ref Range   Glucose-Capillary 167 (H) 65 - 99 mg/dL   Comment 1 Notify RN   Glucose, capillary     Status: Abnormal   Collection Time: 01/17/16  6:11 AM  Result Value Ref Range   Glucose-Capillary 125 (H) 65 - 99 mg/dL   Comment 1 Notify RN      Physical Exam: General: NAD. Well-developed.  ENT- former trach site CDI,  Psych: Mood and affect are appropriate Head: craniectomy site without swelling Heart: RRR without jvd Lungs: clear. Normal effort Abdomen: +BS, soft. PEG site CDI Musc: no edema. No tenderness. Skin: no rash. Warm and dry. Neurologic:  Motor strength is 5/5 in Right  deltoid, bicep, tricep, grip, hip flexor, knee extensors, ankle dorsiflexor and plantar flexor 2-/5 Left hip flexion  LUE: 0/5  Sensation diminished to light touch LUE  Assessment/Plan: 1. Functional deficits secondary to RIght MCA infarct with left hemiplegia which require 3+ hours per  day of interdisciplinary therapy in a comprehensive inpatient rehab setting. Physiatrist is providing close team supervision and 24 hour management of active medical problems listed below. Physiatrist and rehab team continue to assess barriers to discharge/monitor patient progress toward functional and medical goals. FIM: Function - Bathing Position: Wheelchair/chair at sink Body parts bathed by patient: Left arm, Chest, Abdomen, Front perineal area, Right upper leg, Left upper leg, Right lower leg Body parts bathed by helper: Left lower leg, Buttocks, Right arm Bathing not applicable: Front perineal area, Buttocks (did not attempt this session) Assist Level: Touching or steadying assistance(Pt > 75%)  Function- Upper Body Dressing/Undressing What is the patient wearing?: Pull over  shirt/dress Pull over shirt/dress - Perfomed by patient: Thread/unthread right sleeve Pull over shirt/dress - Perfomed by helper: Thread/unthread left sleeve, Put head through opening, Pull shirt over trunk Assist Level: Touching or steadying assistance(Pt > 75%) Function - Lower Body Dressing/Undressing What is the patient wearing?: Pants, Faythe Dingwalled Hose, Shoes Position: Research officer, trade unionWheelchair/chair at Harrah's Entertainmentsink Pants- Performed by patient: Thread/unthread right pants leg Pants- Performed by helper: Thread/unthread left pants leg, Pull pants up/down Non-skid slipper socks- Performed by helper: Don/doff right sock Socks - Performed by patient: Don/doff right sock, Don/doff left sock Socks - Performed by helper: Don/doff right sock, Don/doff left sock Shoes - Performed by patient: Don/doff right shoe Shoes - Performed by helper: Don/doff left shoe, Fasten right, Fasten left TED Hose - Performed by helper: Don/doff right TED hose, Don/doff left TED hose Assist for footwear: Dependant Assist for lower body dressing:  (total)  Function - Toileting Toileting activity did not occur: Safety/medical concerns Toileting steps completed by helper: Adjust clothing prior to toileting, Performs perineal hygiene, Adjust clothing after toileting Toileting Assistive Devices: Other (comment) (lift) Assist level: Two helpers  Function - ArchivistToilet Transfers Toilet transfer activity did not occur: Safety/medical concerns Toilet transfer assistive device: Systems developerMechanical lift Mechanical lift: Huntley DecSara Assist level to toilet: 2 helpers Assist level from toilet: 2 helpers  Function - Chair/bed transfer Chair/bed transfer method: Squat pivot Chair/bed transfer assist level: Moderate assist (Pt 50 - 74%/lift or lower) Chair/bed transfer assistive device: Sliding board, Armrests Mechanical lift: Stedy Chair/bed transfer details: Verbal cues for technique, Tactile cues for weight shifting, Tactile cues for sequencing, Verbal cues for  sequencing, Manual facilitation for weight shifting  Function - Locomotion: Wheelchair Will patient use wheelchair at discharge?: Yes Type: Manual Max wheelchair distance: [id number] Assist Level: Supervision or verbal cues Assist Level: Supervision or verbal cues Wheel 150 feet activity did not occur: Safety/medical concerns Assist Level: Supervision or verbal cues Turns around,maneuvers to table,bed, and toilet,negotiates 3% grade,maneuvers on rugs and over doorsills: No Function - Locomotion: Ambulation Ambulation activity did not occur: Safety/medical concerns Assistive device: Parallel bars, Orthosis Max distance: 4510ft  Assist level: 2 helpers Walk 10 feet activity did not occur: Safety/medical concerns Assist level: Moderate assist (Pt 50 - 74%) Walk 50 feet with 2 turns activity did not occur: Safety/medical concerns Walk 150 feet activity did not occur: Safety/medical concerns Walk 10 feet on uneven surfaces activity did not occur: Safety/medical concerns  Function - Comprehension Comprehension: Auditory Comprehension assist level: Follows basic conversation/direction with no assist  Function - Expression Expression: Verbal Expression assist level: Expresses basic needs/ideas: With no assist  Function - Social Interaction Social Interaction assist level: Interacts appropriately 90% of the time - Needs monitoring or encouragement for participation or interaction.  Function - Problem Solving Problem solving assist level: Solves basic 50 - 74% of the time/requires  cueing 25 - 49% of the time  Function - Memory Memory assist level: Recognizes or recalls 75 - 89% of the time/requires cueing 10 - 24% of the time Patient normally able to recall (first 3 days only): Current season, That he or she is in a hospital  Medical Problem List and Plan: 1.  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  .             -continue PT, OT, SLP 2.   DVT Prophylaxis/Anticoagulation: Subcutaneous heparin for DVT prophylaxis 3. Pain Management/chronic back pain: Oxycodone 5 mg every 6 hours as needed  -continue baclofen prn for cramps/spasms.  Left hand and wrist pain improved with splint        4. Mood: Prozac 40 mg daily.  Abilify 5 mg daily 5. Neuropsych: This patient is capable of making decisions on his own behalf.Poor attn: increased ritalin 10mg  BID 6. Skin/Wound Care: Routine skin checks 7. 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 Gastrostomy PEG tube 12/05/2015. Nocturnal tube feeds recently discontinued. Currently on dysphagia #3/thin.              -regular PEG maintenance, may d/c after 01/19/2016 10. Hypertension.   -dced Hydralazine 10 mg every 8 hours  -continue amlodipine 5 mg daily, lisinopril 30 mg daily with hold parameters Some fluctuations/lability Vitals:   01/16/16 1414 01/17/16 0500  BP: (!) 138/49 (!) 133/53  Pulse: (!) 40 (!) 40  Resp: 18 18  Temp: 97.8 F (36.6 C) 97.5 F (36.4 C)   11. Diabetes mellitus. Lantus insulin 25 units daily at bedtime.  Intake is good  Check blood sugars before meals and at bedtime  Reasonable control  Recent Labs  01/16/16 1555 01/16/16 2055 01/17/16 0611  GLUCAP 114* 167* 125*   12. Hyperlipidemia. Lipitor 80 mg daily at bedtime 13.Tobacco abuse. Counseling as appropriate 14.  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 ?RLS- pt c/o symptoms BLE prior to CVA   -low dose requip effective    LOS (Days) 21 A FACE TO FACE EVALUATION WAS PERFORMED  SWARTZ,ZACHARY T 01/17/2016, 10:32 AM

## 2016-01-17 NOTE — Progress Notes (Signed)
Occupational Therapy Session Note  Patient Details  Name: Denver Bentson MRN: [id number] Date of Birth: Jan 11, 1961  Today's Date: 01/17/2016 OT Individual Time:  - 0900-1000  (60 min)       Short Term Goals: Week 1:  OT Short Term Goal 1 (Week 1): Pt will attend to L side of body to wash L arm with Mod instructional cues OT Short Term Goal 1 - Progress (Week 1): Not met OT Short Term Goal 2 (Week 1): Pt will don shirt with max A using hemi techniques OT Short Term Goal 2 - Progress (Week 1): Met OT Short Term Goal 3 (Week 1): Pt will tolerate giv-more-sling on L UE when OOB OT Short Term Goal 3 - Progress (Week 1): Not met Week 2:  OT Short Term Goal 1 (Week 2): Pt will maintain sitting balance min assist unsupported during selfcare tasks.  OT Short Term Goal 1 - Progress (Week 2): Met OT Short Term Goal 2 (Week 2): Pt will complete sit to stand for LB bathing and dressing with no more than mod assist level. OT Short Term Goal 2 - Progress (Week 2): Met OT Short Term Goal 3 (Week 2): Pt will complete UB dressing with min assist and mod demonstrational cueing  OT Short Term Goal 3 - Progress (Week 2): Met OT Short Term Goal 4 (Week 2): Pt will initiate and complete supine to sit on the left side with mod assist in preparation for selfcare tasks.  OT Short Term Goal 4 - Progress (Week 2): Met OT Short Term Goal 5 (Week 2): Pt will visually scan left of midline to locate grooming, bathing, or feeding items with no more than mod instructional cueing.   OT Short Term Goal 5 - Progress (Week 2): Not met Week 3:  OT Short Term Goal 1 (Week 3): Pt will visually scan left of midline to locate grooming, bathing, or feeding items with no more than mod instructional cueing.   OT Short Term Goal 2 (Week 3): Pt will transfer stand pivot to the walk-in shower with supervision.   OT Short Term Goal 3 (Week 3): Pt will complete LB dressing with mod assist to donn pullover pants only. OT Short Term  Goal 3 - Progress (Week 3): Other (comment) OT Short Term Goal 4 (Week 3): Pt will complete toilet transfer with mod assist stand pivot to elevated toilet.   OT Short Term Goal 5 (Week 3): Pt will use the LUE as a stabilizer with selfcare tasks with max assist.      Skilled Therapeutic Interventions/Progress Updates:    Pt lying in bed upon OT arrival.  Addressed LB bathing and dressing with pt rolling to right and left with min assist.  Perfomed bridging to don pants with max assist for pants and SBA for bridging.  Pt sat EOB to completed UB bathing and dressing. Transferred to wc using stedy.  Completed joint mobilizations to the wrist, carpals, and MPs, and ankle.  Ppt tolerated for 5 minutes.  Pt states his right or left  hip hurts more when he is sitting.  Showed pt on pressure relief strategies.  Placed half lap tray on wc and educated pt on rotating palm in weightbearing over the LUE while sitting/  Left pt in wc and took to nursing station with safety belt in place,  Educated pt on working hand in palm down position, rocking the baby, and moving left LE towards the Right left for decreased ankle eversion.  Terapy Documentation Precautions:  Precautions Precautions: Fall Precaution Comments: L hemiplegia, R hemicraniotomy  Required Braces or Orthoses: Other Brace/Splint Other Brace/Splint: Helmet when OOB Restrictions Weight Bearing Restrictions: No General:   Vital Signs: Therapy Vitals Temp: 97.5 F (36.4 C) Temp Source: Oral Pulse Rate: (!) 40 Resp: 18 BP: (!) 133/53 Patient Position (if appropriate): Lying Oxygen Therapy SpO2: 98 % O2 Device: Not Delivered Pain:none ADL: ADL ADL Comments: Please see functional navigator    Other Treatments:    See Function Navigator for Current Functional Status.   Therapy/Group: Individual Therapy  Lisa Roca 01/17/2016, 8:01 AM

## 2016-01-17 NOTE — Progress Notes (Signed)
Patient A/O, no noted distress. Denies pain. Patient requested sleeping aid, but does not have a prn order. Patient noted to be sleep on rounds, but easily awaken. Patient calls to for usage of urinal, does not use the call light. Staff will continue to monitor and meet needs.

## 2016-01-17 NOTE — Progress Notes (Signed)
Physical Therapy Weekly Progress Note  Patient Details  Name: James Holden MRN: [id number] Date of Birth: Feb 13, 1961  Beginning of progress report period: January 10, 2016 End of progress report period: January 17, 2016  Today's Date: 01/17/2016 PT Individual Time: 1346-1500 PT Individual Time Calculation (min): 74 min    Patient has met 3 of 4 short term goals.  Continued L sided neglect requires mod-max cues to attention to the L for [facility] mobility   Patient continues to demonstrate the following deficits: awareness of the L side, strength, spasticity, abnormal tone, motor control, proprioception and therefore will continue to benefit from skilled PT intervention to enhance overall performance with activity tolerance, balance, postural control, ability to compensate for deficits, functional use of  left upper extremity and left lower extremity, attention and awareness.  Patient progressing toward long term goals..  Continue plan of care.  PT Short Term Goals Week 3:  PT Short Term Goal 1 (Week 3): Patient will perform SB transfers with min assist to R and Mod assist consistently to L PT Short Term Goal 1 - Progress (Week 3): Met PT Short Term Goal 2 (Week 3): Patient will perform squat pivot transfer to L and R with mod assist  PT Short Term Goal 2 - Progress (Week 3): Met PT Short Term Goal 3 (Week 3): Patient will ambulate 32f with LRAD and max assist  PT Short Term Goal 3 - Progress (Week 3): Met PT Short Term Goal 4 (Week 3): Patient will propel WC with supervision assist and only min cues for awareness of L side  PT Short Term Goal 4 - Progress (Week 3): Partly met Week 4:  PT Short Term Goal 1 (Week 4): LTG = STG due to ELOS   Skilled Therapeutic Interventions/Progress Updates:     Patient received supine in bed and agreeable to PT. Dressing lower body with max assist using bridge to pull pants to waist. Bed mobility with supervision Assist and heavy use of bed rails with  supine to sit on the R side. Cue for improved LLE movement and control.   WC mobility instructed by PT x [id number] [id number] supervision assist and max cues for awareness of L side. Pt noted to have decreased awareness of L on this day compared to previous, hitting wall multiple times before making corrections.   Car transfer instructed by PT with min assist from PT and max cues for proper SB placement, improve anterior weight shift, lateral L pelvic movement and assist with the LLE into the car.   Stand pivot and squat pivot transfers instructed by PT. Mod assist progressing to Min assist to R and L with max cues for improved anterior weight shift, proper LE placement, to increase trunk activation to allow Lateral L trunk closure.  SB transfers also instructed by PT to L with mod progressing to min assist from PT.   Patient returned to room and [facility]     Therapy Documentation Precautions:  Precautions Precautions: Fall Precaution Comments: L hemiplegia, R hemicraniotomy  Required Braces or Orthoses: Other Brace/Splint Other Brace/Splint: Helmet when OOB Restrictions Weight Bearing Restrictions: No Vital Signs: Therapy Vitals Temp: 98.2 F (36.8 C) Temp Source: Oral Pulse Rate: (!) 42 Resp: 16 BP: (!) 128/51 Patient Position (if appropriate): Lying Oxygen Therapy SpO2: 100 % O2 Device: Not Delivered Pain:   0/10    See Function Navigator for Current Functional Status.  Therapy/Group: Individual Therapy  Lorie Phenix 01/17/2016, 4:52 PM

## 2016-01-18 ENCOUNTER — Inpatient Hospital Stay (HOSPITAL_COMMUNITY): Payer: BLUE CROSS/BLUE SHIELD | Admitting: Physical Therapy

## 2016-01-18 LAB — GLUCOSE, CAPILLARY
GLUCOSE-CAPILLARY: 130 mg/dL — AB (ref 65–99)
GLUCOSE-CAPILLARY: 144 mg/dL — AB (ref 65–99)
GLUCOSE-CAPILLARY: 184 mg/dL — AB (ref 65–99)
Glucose-Capillary: 162 mg/dL — ABNORMAL HIGH (ref 65–99)

## 2016-01-18 NOTE — Progress Notes (Signed)
Patient ID: James Holden, male   DOB: 01/06/1961, 55 y.o.   MRN: [id number]   01/18/16.  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 Patient continues to do well.  Only complaint is insomnia.  Apparently he did not do well with trazodone which caused some hallucinations  PMH  History a tracheostomy decannulated 12/25/2015 History of gastrostomy, status post PEG, 12/05/2015.  Currently, on dysphagia 3 diet Hypertension Dyslipidemia History tobacco abuse History of restless leg syndrome Diabetes mellitus     Objective: Vital Signs: Blood pressure 132/61, pulse (!) 46, temperature 98.2 F (36.8 C), temperature source Oral, resp. rate 18, height 5\' 10"  (1.778 m), weight 143 lb 15.4 oz (65.3 kg), SpO2 100 %. No results found. No results for input(s): WBC, HGB, HCT, PLT in the last 72 hours. No results for input(s): NA, K, CL, CO2, GLUCOSE, BUN, CREATININE, CALCIUM in the last 72 hours. CBG (last 3)   Recent Labs  01/17/16 1730 01/17/16 2108 01/18/16 0637  GLUCAP 141* 158* 130*    Wt Readings from Last 3 Encounters:  01/18/16 143 lb 15.4 oz (65.3 kg)  12/26/15 201 lb 11.2 oz (91.5 kg)   BP Readings from Last 3 Encounters:  01/18/16 132/61   Physical Exam:  General appearance:  Alert, no distress Head: Defect right parietal skull Eyes: Negative Nose: Negative Throat: Clear Neck: Status post trach  Resp: Clear to auscultation Cardio: Normal S1, S2 GI: Status post PEG Extremities: No edema  Skin: No rash Neurologic: Right hemiparesis    Assessment/Plan:  Left hemiparesis secondary to right MCA infarct; status post right craniotomy Hypertension.  Well-controlled Insomnia.  Will try zolpidem 5 mg Status post gastrostomy PEG tube-continues to tolerate dysphagia 3 diet Diabetes mellitus.  Continue Lantus at bedtime.  Nice control    ELOS (Days)  22  Rogelia BogaKWIATKOWSKI,Zymiere Trostle FRANK 01/18/2016, 10:54 AM

## 2016-01-18 NOTE — Progress Notes (Signed)
Physical Therapy Session Note  Patient Details  Name: James Holden MRN: [id number] Date of Birth: 06-16-1960  Today's Date: 01/18/2016 PT Individual Time: 1415-1445 PT Individual Time Calculation (min): 30 min    Short Term Goals: Week 3:  PT Short Term Goal 1 (Week 3): Patient will perform SB transfers with min assist to R and Mod assist consistently to L PT Short Term Goal 1 - Progress (Week 3): Met PT Short Term Goal 2 (Week 3): Patient will perform squat pivot transfer to L and R with mod assist  PT Short Term Goal 2 - Progress (Week 3): Met PT Short Term Goal 3 (Week 3): Patient will ambulate 39f with LRAD and max assist  PT Short Term Goal 3 - Progress (Week 3): Met PT Short Term Goal 4 (Week 3): Patient will propel WC with supervision assist and only min cues for awareness of L side  PT Short Term Goal 4 - Progress (Week 3): Partly met Week 4:  PT Short Term Goal 1 (Week 4): LTG = STG due to ELOS  Skilled Therapeutic Interventions/Progress Updates:     Patient received supine in bed and agreeable to PT. PT assisted patient to don pants un supine with max assist with bridge to pull to waist; PT required to stabilize the LLE to allow gluteal clearance.  Supine to sit to the R side with min assist to manage the LLE. Pt donned shoes with max assist from PT sitting EOB. SB transfer to the L with min assist from PT to stabilize SB and position LLE.   [facility] tolerance at parallel bars x 4.5 minutes with min assist from PT. Pt noted to hyperextend LLE with weight shifting over the LLE.   Patient returned to room and performed squat pivot transfer with min-mod assist from PT. Sit>supine with supervision assist from PT for use of RLE to control the LLE and heavy use of bed rail by patient.   Patient left supine with call bell in reach.   Therapy Documentation Precautions:  Precautions Precautions: Fall Precaution Comments: L hemiplegia, R  hemicraniotomy  Required Braces or Orthoses: Other Brace/Splint Other Brace/Splint: Helmet when OOB Restrictions Weight Bearing Restrictions: No Vital Signs: Therapy Vitals Temp: 98.2 F (36.8 C) Temp Source: Oral Pulse Rate: (!) 43 Resp: 19 BP: (!) 140/49 Patient Position (if appropriate): Lying Oxygen Therapy SpO2: 100 % O2 Device: Not Delivered   See Function Navigator for Current Functional Status.   Therapy/Group: Individual Therapy  ALorie Phenix12/03/2015, 4:13 PM

## 2016-01-19 LAB — GLUCOSE, CAPILLARY
GLUCOSE-CAPILLARY: 149 mg/dL — AB (ref 65–99)
Glucose-Capillary: 160 mg/dL — ABNORMAL HIGH (ref 65–99)
Glucose-Capillary: 115 mg/dL — ABNORMAL HIGH (ref 65–99)
Glucose-Capillary: 198 mg/dL — ABNORMAL HIGH (ref 65–99)

## 2016-01-19 NOTE — Plan of Care (Signed)
Problem: RH BOWEL ELIMINATION Goal: RH STG MANAGE BOWEL WITH ASSISTANCE STG Manage Bowel with mod Assistance.  Outcome: Not Progressing Sorbitol given

## 2016-01-19 NOTE — Progress Notes (Signed)
Patient ID: James Holden, male   DOB: 10/21/1960, 55 y.o.   MRN: [id number]   01/19/16.  James Holden is a 55 y.o. male  Admitted for CIR following a right MCA stroke with left hemiparesis.  He is status post craniotomy on 11/14/2016.  PMH  History a tracheostomy decannulated 12/25/2015 History of gastrostomy, status post PEG, 12/05/2015.  Currently, on dysphagia 3 diet Hypertension Dyslipidemia History tobacco abuse History of restless leg syndrome Diabetes mellitus  Subjective: No new complaints.  Had a good night last night.  No further complaints of insomnia.  Objective: Vital signs in last 24 hours: Temp:  [98.2 F (36.8 C)] 98.2 F (36.8 C) (12/03 0616) Pulse Rate:  [42-43] 42 (12/03 0616) Resp:  [18-19] 18 (12/03 0616) BP: (132-140)/(49-61) 132/61 (12/03 0616) SpO2:  [96 %-100 %] 96 % (12/03 0616) Weight:  [181 lb (82.1 kg)] 181 lb (82.1 kg) (12/03 0616) Weight change: 37 lb 0.6 oz (16.8 kg) Last BM Date: 01/15/16  Intake/Output from previous day: 12/02 0701 - 12/03 0700 In: 960 [P.O.:960] Out: 900 [Urine:900] Last cbgs: CBG (last 3)   Recent Labs  01/18/16 1637 01/18/16 2119 01/19/16 0639  GLUCAP 184* 162* 149*   BP Readings from Last 3 Encounters:  01/19/16 132/61    Physical Exam General: No apparent distress   HEENT: not dry Lungs: Normal effort. Lungs clear to auscultation, no crackles or wheezes. Cardiovascular: Regular rate and rhythm, no edema Abdomen: S/NT/ND; BS(+) Musculoskeletal:  unchanged Neurological: No new neurological deficits Wounds: N/A    Skin: clear   Mental state: Alert, oriented, cooperative    Lab Results: BMET    Component Value Date/Time   NA 134 (L) 01/13/2016 1203   K 4.3 01/13/2016 1203   CL 100 (L) 01/13/2016 1203   CO2 26 01/13/2016 1203   GLUCOSE 144 (H) 01/13/2016 1203   BUN 12 01/13/2016 1203   CREATININE 0.98 01/13/2016 1203   CALCIUM 9.2 01/13/2016 1203   GFRNONAA >60 01/13/2016 1203   GFRAA >60 01/13/2016 1203   CBC    Component Value Date/Time   WBC 6.9 01/13/2016 1203   RBC 4.12 (L) 01/13/2016 1203   HGB 11.1 (L) 01/13/2016 1203   HCT 35.3 (L) 01/13/2016 1203   PLT 181 01/13/2016 1203   MCV 85.7 01/13/2016 1203   MCH 26.9 01/13/2016 1203   MCHC 31.4 01/13/2016 1203   RDW 14.8 01/13/2016 1203   LYMPHSABS 2.5 01/13/2016 1203   MONOABS 0.5 01/13/2016 1203   EOSABS 0.2 01/13/2016 1203   BASOSABS 0.0 01/13/2016 1203    No results found for: HGBA1C Medications: I have reviewed the patient's current medications.  Assessment/Plan:  Status post right MCA infarct.  Continue CIR Essential hypertension.  Blood pressure well controlled Insomnia, improved Diabetes mellitus.  Reasonable control.  No change in therapy    Length of stay, days: 23  Rogelia BogaKWIATKOWSKI,Alec Jaros FRANK , MD 01/19/2016, 9:42 AM

## 2016-01-20 ENCOUNTER — Inpatient Hospital Stay (HOSPITAL_COMMUNITY): Payer: BLUE CROSS/BLUE SHIELD | Admitting: Speech Pathology

## 2016-01-20 ENCOUNTER — Inpatient Hospital Stay (HOSPITAL_COMMUNITY): Payer: BLUE CROSS/BLUE SHIELD | Admitting: Physical Therapy

## 2016-01-20 ENCOUNTER — Inpatient Hospital Stay (HOSPITAL_COMMUNITY): Payer: BLUE CROSS/BLUE SHIELD | Admitting: Occupational Therapy

## 2016-01-20 LAB — GLUCOSE, CAPILLARY
Glucose-Capillary: 133 mg/dL — ABNORMAL HIGH (ref 65–99)
Glucose-Capillary: 135 mg/dL — ABNORMAL HIGH (ref 65–99)
Glucose-Capillary: 161 mg/dL — ABNORMAL HIGH (ref 65–99)
Glucose-Capillary: 198 mg/dL — ABNORMAL HIGH (ref 65–99)

## 2016-01-20 MED ORDER — INSULIN GLARGINE 100 UNIT/ML ~~LOC~~ SOLN
25.0000 [IU] | Freq: Every day | SUBCUTANEOUS | Status: DC
  Administered 2016-01-20: 25 [IU] via SUBCUTANEOUS
  Filled 2016-01-20 (×2): qty 0.25

## 2016-01-20 NOTE — Progress Notes (Signed)
Occupational Therapy Weekly Progress Note  Patient Details  Name: James Holden MRN: [id number] Date of Birth: March 06, 1960  Beginning of progress report period: January 10, 2016 End of progress report period: January 20, 2016  Today's Date: 01/20/2016 OT Individual Time: 1045-1200 OT Individual Time Calculation (min): 75 min     Patient has met 3 of 5 short term goals.  Pt is making strong progress with his transfers as he is now able to complete squat pivot transfers with min -mod A.  To transfer to the toilet, he is continuing to work on doing this consistently.  The shower transfer is not yet safe to perform without the stedy lift due to confined space to support him with a squat pivot.  As he progresses with these transfers, a shower transfer will be attempted without using the [facility] lift.  He is improving with scanning to the L to find items with min cues and using LUE as a stabilizer with B/D. He is also doing well with donning pants. He can now stand to the sink with min A and pull the pants 80% of the way over his hips.  Patient continues to demonstrate the following deficits: dense LUE hemiplegia with subluxation, increased flexor tone and consistent pain with passive movement, L inattention, decreased postural control in standing, decreased processing and memory skills and therefore will continue to benefit from skilled OT intervention to enhance overall performance with BADL.  Patient progressing toward long term goals..  Plan of care revisions: Pt is making progress, but his LTGs of toileting and LB dressing have been downgraded from min A to mod A.., due to limited LLE movement limiting balance.  OT Short Term Goals Week 1:  OT Short Term Goal 1 (Week 1): Pt will attend to L side of body to wash L arm with Mod instructional cues OT Short Term Goal 1 - Progress (Week 1): Not met OT Short Term Goal 2 (Week 1): Pt will don shirt with max A using hemi techniques OT Short Term Goal  2 - Progress (Week 1): Met OT Short Term Goal 3 (Week 1): Pt will tolerate giv-more-sling on L UE when OOB OT Short Term Goal 3 - Progress (Week 1): Not met Week 2:  OT Short Term Goal 1 (Week 2): Pt will maintain sitting balance min assist unsupported during selfcare tasks.  OT Short Term Goal 1 - Progress (Week 2): Met OT Short Term Goal 2 (Week 2): Pt will complete sit to stand for LB bathing and dressing with no more than mod assist level. OT Short Term Goal 2 - Progress (Week 2): Met OT Short Term Goal 3 (Week 2): Pt will complete UB dressing with min assist and mod demonstrational cueing  OT Short Term Goal 3 - Progress (Week 2): Met OT Short Term Goal 4 (Week 2): Pt will initiate and complete supine to sit on the left side with mod assist in preparation for selfcare tasks.  OT Short Term Goal 4 - Progress (Week 2): Met OT Short Term Goal 5 (Week 2): Pt will visually scan left of midline to locate grooming, bathing, or feeding items with no more than mod instructional cueing.   OT Short Term Goal 5 - Progress (Week 2): Not met Week 3:  OT Short Term Goal 1 (Week 3): Pt will visually scan left of midline to locate grooming, bathing, or feeding items with no more than mod instructional cueing.   OT Short Term Goal 1 - Progress (  Week 3): Met OT Short Term Goal 2 (Week 3): Pt will transfer stand pivot to the walk-in shower with supervision.   OT Short Term Goal 2 - Progress (Week 3): Not met (Pt has needed to use Stedy lift due to confined transfer space.) OT Short Term Goal 3 (Week 3): Pt will complete LB dressing with mod assist to donn pullover pants only. OT Short Term Goal 3 - Progress (Week 3): Met OT Short Term Goal 4 (Week 3): Pt will complete toilet transfer with mod assist stand pivot to elevated toilet.   OT Short Term Goal 4 - Progress (Week 3): Progressing toward goal OT Short Term Goal 5 (Week 3): Pt will use the LUE as a stabilizer with selfcare tasks with max assist.  OT Short  Term Goal 5 - Progress (Week 3): Met Week 4:  OT Short Term Goal 1 (Week 4): STGs = LTGs   Skilled Therapeutic Interventions/Progress Updates:    "I am feeling very down today and am not motivated to do anything" Spent some time counseling and encouraging pt and then he was agreeable to getting out of bed for a sponge bath at the sink. He declined a shower, stating that it would feel like too much. Pt sat at EOB for several minutes while working on L visual scanning activities. Attempted a stand pivot to w/c to L but unable to mobilize LLE safely. He did very well with squat pivot to W/c needing min -mod A. Stood to sink with only min A and maintained balance with steadying A as he actively used RUE for LB self care.  Demonstrated improved scanning to L to find items on sink. Cued pt to use R hand to search with eyes following hand.  Pt felt this cue was helpful. kinesio tape to L shoulder to provide support for subluxation Estim 10 min to L finger extensors to reduce tone at intensity 40 at 35 pps, 10 sec on/off. Pt tolerated stimulation well and cued to visually focus on hand.  Hand placed in resting hand splint. Quick release belt applied.  Nurse tech arriving to room to provide lunch.  Therapy Documentation Precautions: Precautions Precautions: Fall Precaution Comments: L hemiplegia, R hemicraniotomy  Required Braces or Orthoses: Other Brace/Splint Other Brace/Splint: Helmet when OOB Restrictions Weight Bearing Restrictions: No  Pain: Pain Assessment Pain Assessment: No/denies pain - pt has pain with PROM of shoulder and wrist ADL: ADL ADL Comments: Please see functional navigator  See Function Navigator for Current Functional Status.   Therapy/Group: Individual Therapy  SAGUIER,JULIA 01/20/2016, 9:16 AM

## 2016-01-20 NOTE — Progress Notes (Addendum)
Subjective/Complaints: Had a good night. No complaints. Sleeping well.   ROS: pt denies nausea, vomiting, diarrhea, cough, shortness of breath or chest pain   Objective: Vital Signs: Blood pressure (!) 132/57, pulse (!) 43, temperature 98 F (36.7 C), temperature source Oral, resp. rate 18, height 5\' 10"  (1.778 m), weight 82.1 kg (181 lb), SpO2 100 %. No results found. Results for orders placed or performed during the hospital encounter of 12/27/15 (from the past 72 hour(s))  Glucose, capillary     Status: Abnormal   Collection Time: 01/17/16  5:30 PM  Result Value Ref Range   Glucose-Capillary 141 (H) 65 - 99 mg/dL   Comment 1 Notify RN   Glucose, capillary     Status: Abnormal   Collection Time: 01/17/16  9:08 PM  Result Value Ref Range   Glucose-Capillary 158 (H) 65 - 99 mg/dL  Glucose, capillary     Status: Abnormal   Collection Time: 01/18/16  6:37 AM  Result Value Ref Range   Glucose-Capillary 130 (H) 65 - 99 mg/dL  Glucose, capillary     Status: Abnormal   Collection Time: 01/18/16 12:03 PM  Result Value Ref Range   Glucose-Capillary 144 (H) 65 - 99 mg/dL   Comment 1 Notify RN   Glucose, capillary     Status: Abnormal   Collection Time: 01/18/16  4:37 PM  Result Value Ref Range   Glucose-Capillary 184 (H) 65 - 99 mg/dL   Comment 1 Notify RN   Glucose, capillary     Status: Abnormal   Collection Time: 01/18/16  9:19 PM  Result Value Ref Range   Glucose-Capillary 162 (H) 65 - 99 mg/dL  Glucose, capillary     Status: Abnormal   Collection Time: 01/19/16  6:39 AM  Result Value Ref Range   Glucose-Capillary 149 (H) 65 - 99 mg/dL  Glucose, capillary     Status: Abnormal   Collection Time: 01/19/16 11:49 AM  Result Value Ref Range   Glucose-Capillary 160 (H) 65 - 99 mg/dL  Glucose, capillary     Status: Abnormal   Collection Time: 01/19/16  4:56 PM  Result Value Ref Range   Glucose-Capillary 115 (H) 65 - 99 mg/dL  Glucose, capillary     Status: Abnormal   Collection Time: 01/19/16  8:31 PM  Result Value Ref Range   Glucose-Capillary 198 (H) 65 - 99 mg/dL   Comment 1 Notify RN   Glucose, capillary     Status: Abnormal   Collection Time: 01/20/16  6:43 AM  Result Value Ref Range   Glucose-Capillary 133 (H) 65 - 99 mg/dL   Comment 1 Notify RN   Glucose, capillary     Status: Abnormal   Collection Time: 01/20/16 11:37 AM  Result Value Ref Range   Glucose-Capillary 135 (H) 65 - 99 mg/dL     Physical Exam: General: NAD ENT- former trach site CDI,  Psych: Mood and affect are appropriate Head: craniectomy site without swelling noted Heart: RRR no jvd Lungs: clear with reg effort Abdomen: +BS, soft. PEG site remains CDI Musc: no edema. No tenderness. Skin: skin intact. Neurologic: left inattention and field cut Motor strength is 5/5 in Right  deltoid, bicep, tricep, grip, hip flexor, knee extensors, ankle dorsiflexor and plantar flexor 2-/5 Left hip flexion  LUE: 0/5 prox to distal.  Sensation diminished to light touch LUE  Assessment/Plan: 1. Functional deficits secondary to RIght MCA infarct with left hemiplegia which require 3+ hours per day of interdisciplinary therapy  in a comprehensive inpatient rehab setting. Physiatrist is providing close team supervision and 24 hour management of active medical problems listed below. Physiatrist and rehab team continue to assess barriers to discharge/monitor patient progress toward functional and medical goals. FIM: Function - Bathing Position: Wheelchair/chair at sink Body parts bathed by patient: Left arm, Chest, Abdomen, Front perineal area, Right upper leg, Left upper leg Body parts bathed by helper: Left lower leg, Buttocks, Right arm, Right lower leg, Back Bathing not applicable: Front perineal area, Buttocks (did not attempt this session) Assist Level: Touching or steadying assistance(Pt > 75%)  Function- Upper Body Dressing/Undressing What is the patient wearing?: Pull over  shirt/dress Pull over shirt/dress - Perfomed by patient: Thread/unthread right sleeve, Put head through opening Pull over shirt/dress - Perfomed by helper: Thread/unthread left sleeve, Pull shirt over trunk Assist Level: Touching or steadying assistance(Pt > 75%) Function - Lower Body Dressing/Undressing What is the patient wearing?: Pants, Faythe Dingwalled Hose, Shoes Position: Research officer, trade unionWheelchair/chair at Harrah's Entertainmentsink Pants- Performed by patient: Thread/unthread right pants leg Pants- Performed by helper: Pull pants up/down, Thread/unthread left pants leg Non-skid slipper socks- Performed by helper: Don/doff right sock Socks - Performed by patient: Don/doff right sock, Don/doff left sock Socks - Performed by helper: Don/doff right sock, Don/doff left sock Shoes - Performed by patient: Don/doff right shoe Shoes - Performed by helper: Don/doff left shoe, Fasten right, Fasten left TED Hose - Performed by helper: Don/doff right TED hose, Don/doff left TED hose Assist for footwear: Partial/moderate assist Assist for lower body dressing: Touching or steadying assistance (Pt > 75%)  Function - Toileting Toileting activity did not occur: Safety/medical concerns Toileting steps completed by helper: Adjust clothing prior to toileting, Performs perineal hygiene, Adjust clothing after toileting Toileting Assistive Devices: Other (comment) (lift) Assist level: Two helpers  Function - ArchivistToilet Transfers Toilet transfer activity did not occur: Safety/medical concerns Toilet transfer assistive device: Systems developerMechanical lift Mechanical lift: Forensic scientistara Assist level to toilet: 2 helpers Assist level from toilet: 2 helpers  Function - Chair/bed transfer Chair/bed transfer method: Lateral scoot Chair/bed transfer assist level: Touching or steadying assistance (Pt > 75%) Chair/bed transfer assistive device: Sliding board, Armrests Mechanical lift: Stedy Chair/bed transfer details: Verbal cues for technique, Tactile cues for weight shifting,  Tactile cues for sequencing, Verbal cues for sequencing, Manual facilitation for weight shifting  Function - Locomotion: Wheelchair Will patient use wheelchair at discharge?: Yes Type: Manual Max wheelchair distance: [id number]  Assist Level: Supervision or verbal cues Assist Level: Supervision or verbal cues Wheel 150 feet activity did not occur: Safety/medical concerns Assist Level: Supervision or verbal cues Turns around,maneuvers to table,bed, and toilet,negotiates 3% grade,maneuvers on rugs and over doorsills: No Function - Locomotion: Ambulation Ambulation activity did not occur: Safety/medical concerns Assistive device: Parallel bars, Orthosis Max distance: 2310ft  Assist level: 2 helpers Walk 10 feet activity did not occur: Safety/medical concerns Assist level: Moderate assist (Pt 50 - 74%) Walk 50 feet with 2 turns activity did not occur: Safety/medical concerns Walk 150 feet activity did not occur: Safety/medical concerns Walk 10 feet on uneven surfaces activity did not occur: Safety/medical concerns  Function - Comprehension Comprehension: Auditory Comprehension assist level: Follows basic conversation/direction with extra time/assistive device  Function - Expression Expression: Verbal Expression assist level: Expresses basic needs/ideas: With extra time/assistive device  Function - Social Interaction Social Interaction assist level: Interacts appropriately 75 - 89% of the time - Needs redirection for appropriate language or to initiate interaction.  Function - Problem Solving Problem solving assist level:  Solves basic 25 - 49% of the time - needs direction more than half the time to initiate, plan or complete simple activities  Function - Memory Memory assist level: Recognizes or recalls 50 - 74% of the time/requires cueing 25 - 49% of the time Patient normally able to recall (first 3 days only): Current season, That he or she is in a hospital  Medical Problem List and  Plan: 1.  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  .             -continue PT, OT, SLP  -ELOS 01/28/16 2.  DVT Prophylaxis/Anticoagulation: Subcutaneous heparin for DVT prophylaxis 3. Pain Management/chronic back pain: Oxycodone 5 mg every 6 hours as needed  -continue baclofen prn for cramps/spasms.    -Left hand and wrist pain improved with splint        4. Mood: Prozac 40 mg daily.  Abilify 5 mg daily 5. Neuropsych: This patient is capable of making decisions on his own behalf.Poor attn: increased ritalin 10mg  BID 6. Skin/Wound Care: Routine skin checks 7. Fluids/Electrolytes/Nutrition: appreciate dietary note 8.H/O  Tracheostomy Decannulated 12/25/2015--healed 9. Gastrostomy PEG tube 12/05/2015. Nocturnal tube feeds recently discontinued. Currently on dysphagia #3/thin.              -regular PEG maintenance, may d/c after 01/19/2016 10. Hypertension.   -dced Hydralazine 10 mg every 8 hours  -continue amlodipine 5 mg daily, lisinopril 30 mg daily with hold parameters Some fluctuations/lability Vitals:   01/20/16 0415 01/20/16 1304  BP: (!) 147/62 (!) 132/57  Pulse: (!) 43 (!) 43  Resp: 16 18  Temp: 98.2 F (36.8 C) 98 F (36.7 C)   11. Diabetes mellitus. Lantus insulin 22 units daily at bedtime.  Intake is good  Check blood sugars before meals and at bedtime  Fair control at present. Will try increasing lantus back  to 25 units for tighter control  Recent Labs  01/19/16 2031 01/20/16 0643 01/20/16 1137  GLUCAP 198* 133* 135*   12. Hyperlipidemia. Lipitor 80 mg daily at bedtime 13.Tobacco abuse. Counseling as appropriate 14.  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 ?RLS- pt  c/o symptoms BLE prior to CVA   -low dose requip remains effective    LOS (Days) 24 A FACE TO FACE EVALUATION WAS PERFORMED  Saniyya Gau T 01/20/2016, 3:15 PM

## 2016-01-20 NOTE — Progress Notes (Signed)
Speech Language Pathology Daily Session Note  Patient Details  Name: James Holden MRN: [id number] Date of Birth: 12/26/1960  Today's Date: 01/20/2016 SLP Individual Time: 0833-0900 SLP Individual Time Calculation (min): 27 min   Short Term Goals: Week 4: SLP Short Term Goal 1 (Week 4): Patient will consume regular textures and thin liquids without overt s/s of aspiration with Mod I for use of swallowing compensatory strategies.  SLP Short Term Goal 2 (Week 4): Patient will maintain eye contact at midline to conversation partner during functional conversation/task for >1 minute in >50% of opportunities with Mod A verbal and question cues.  SLP Short Term Goal 3 (Week 4): Patient will perform visual scanning tasks to locate items at midline with min A multimodal cues in 75% of opportunities.  SLP Short Term Goal 4 (Week 4): Patient will demonstrate basic problem solving in functional tasks with min A verbal and question cues.  Skilled Therapeutic Interventions:   Skilled treatment session focused on addressing cognition goals. SLP facilitated session by providing Mod assist question cues to recall function of current medications and with repetition SLP was able to fade cues to Min assist for second trial after a 2 minute delay.  SLP also facilitated session with Mod assist multimodal cues for use of daily schedule to identify times and types of therapy for today. Continue with current plan of care.    Function:   Cognition Comprehension Comprehension assist level: Understands basic 75 - 89% of the time/ requires cueing 10 - 24% of the time  Expression   Expression assist level: Expresses basic needs/ideas: With extra time/assistive device  Social Interaction Social Interaction assist level: Interacts appropriately 75 - 89% of the time - Needs redirection for appropriate language or to initiate interaction.  Problem Solving Problem solving assist level: Solves basic 50 - 74% of the  time/requires cueing 25 - 49% of the time  Memory Memory assist level: Recognizes or recalls 75 - 89% of the time/requires cueing 10 - 24% of the time    Pain Pain Assessment Pain Assessment: No/denies pain  Therapy/Group: Individual Therapy  Charlane FerrettiMelissa Cache Decoursey, M.A., CCC-SLP [phone]  James Holden 01/20/2016, 8:56 AM

## 2016-01-20 NOTE — Progress Notes (Signed)
Speech Language Pathology Daily Session Note  Patient Details  Name: James Holden MRN: [id number] Date of Birth: 08/08/1960  Today's Date: 01/20/2016 SLP Individual Time: 1005-1030 SLP Individual Time Calculation (min): 25 min   Short Term Goals: Week 4: SLP Short Term Goal 1 (Week 4): Patient will consume regular textures and thin liquids without overt s/s of aspiration with Mod I for use of swallowing compensatory strategies.  SLP Short Term Goal 2 (Week 4): Patient will maintain eye contact at midline to conversation partner during functional conversation/task for >1 minute in >50% of opportunities with Mod A verbal and question cues.  SLP Short Term Goal 3 (Week 4): Patient will perform visual scanning tasks to locate items at midline with min A multimodal cues in 75% of opportunities.  SLP Short Term Goal 4 (Week 4): Patient will demonstrate basic problem solving in functional tasks with min A verbal and question cues.  Skilled Therapeutic Interventions: Pt was seen for skilled ST targeting cognitive goals.  SLP facilitated the session with a basic, structured grocery task targeting visual scanning and sustained attention.  Pt with flatter affect and decreased motivation to participate in therapies today in comparison to previous therapy sessions.  Pt needed max assist to read words or locate items on his left side during task. He also needed max assist during task for cessation of verbal perseveration.  Pt only able to sustain his attention to task for ~30 seconds before yawning or requiring redirection.  Pt reports he was "just having an aggravating day."  Therapist briefly discussed mood, coping, and adjustment as part of stroke recovery which pt appeared to appreciate.  Pt was left in bed with bed alarm set and call bell within reach.  Continue per current plan of care.        Function:  Eating Eating                 Cognition Comprehension Comprehension assist level:  Understands basic 75 - 89% of the time/ requires cueing 10 - 24% of the time  Expression   Expression assist level: Expresses basic needs/ideas: With extra time/assistive device  Social Interaction Social Interaction assist level: Interacts appropriately 75 - 89% of the time - Needs redirection for appropriate language or to initiate interaction.  Problem Solving Problem solving assist level: Solves basic 50 - 74% of the time/requires cueing 25 - 49% of the time  Memory Memory assist level: Recognizes or recalls 75 - 89% of the time/requires cueing 10 - 24% of the time    Pain Pain Assessment Pain Assessment: No/denies pain Pain Score: 0-No pain  Therapy/Group: Individual Therapy  Trini Christiansen, Melanee SpryNicole L 01/20/2016, 10:27 AM

## 2016-01-20 NOTE — Progress Notes (Signed)
Physical Therapy Session Note  Patient Details  Name: James Holden MRN: [id number] Date of Birth: [birth year]  Today's Date: 01/20/2016 PT Individual Time: 1301-1359 PT Individual Time Calculation (min): 58 min    Short Term Goals: Week 4:  PT Short Term Goal 1 (Week 4): LTG = STG due to ELOS    Therapy Documentation Precautions:  Precautions Precautions: Fall Precaution Comments: L hemiplegia, R hemicraniotomy  Required Braces or Orthoses: Other Brace/Splint Other Brace/Splint: Helmet when OOB Restrictions Weight Bearing Restrictions: No    Vital Signs: Therapy Vitals Temp: 98 F (36.7 C) Temp Source: Oral Pulse Rate: (!) 43 Resp: 18 BP: (!) 132/57 Patient Position (if appropriate): Sitting Oxygen Therapy SpO2: 100 % O2 Device: Not Delivered Pain: Pain Assessment Pain Assessment: No/denies pain    Mat Mobility:  Supine to and from short sit edge of mat with mod assist.  Bridging 3x10 with a 5 second hold manual facilitation for left gluteal activation and approximation applied to LLE.   Transfers: Sit to and from stand transfer with min assist; verbal cues for foot placement, even weight distribution and controlled descent.   Squat pivot transfer with min assist: verbal cues for sequencing, hand placement and controlled descent.   Ambulation: Patient ambulated 25, 20, and 50 feet with right hemiwalker and left walk on AFO with mod assist and one episode of max assist secondary to patient with LOB to the right reaching for handrail. Patient ambulated with a step through gait pattern. Patient with a forward flexed posture.  Manual facilitation for weight shift and increased weight bearing on LLE during swing phase of RLE.  Manual facilitation for bilateral hip extension, approximation at left knee to prevent genu recurvatum and stabilization. Max verbal cues for proper sequence and technique for use of hemiwalker. Patient requires increased time throughout  gait cycle in order for porper sequence nad technique.  Patient limited by fatigue.   Patient tolerated treatment well. Vitals monitored and remained stable throughout session responding appropriately to activity. Patient was without pain during session. Patient tolerated session with rest breaks throughout.  Patient returned to nursing station at end of session with all needs met resting comfortably in wheelchair with quick release belt engaged.     Therapy/Group: Individual Therapy  Retta Diones 01/20/2016, 3:03 PM

## 2016-01-21 ENCOUNTER — Inpatient Hospital Stay (HOSPITAL_COMMUNITY): Payer: BLUE CROSS/BLUE SHIELD | Admitting: Speech Pathology

## 2016-01-21 ENCOUNTER — Inpatient Hospital Stay (HOSPITAL_COMMUNITY): Payer: BLUE CROSS/BLUE SHIELD | Admitting: Occupational Therapy

## 2016-01-21 ENCOUNTER — Inpatient Hospital Stay (HOSPITAL_COMMUNITY): Payer: BLUE CROSS/BLUE SHIELD

## 2016-01-21 LAB — GLUCOSE, CAPILLARY
GLUCOSE-CAPILLARY: 143 mg/dL — AB (ref 65–99)
Glucose-Capillary: 133 mg/dL — ABNORMAL HIGH (ref 65–99)
Glucose-Capillary: 195 mg/dL — ABNORMAL HIGH (ref 65–99)
Glucose-Capillary: 111 mg/dL — ABNORMAL HIGH (ref 65–99)

## 2016-01-21 MED ORDER — INSULIN GLARGINE 100 UNIT/ML ~~LOC~~ SOLN
28.0000 [IU] | Freq: Every day | SUBCUTANEOUS | Status: DC
  Administered 2016-01-21 – 2016-01-22 (×2): 28 [IU] via SUBCUTANEOUS
  Filled 2016-01-21 (×3): qty 0.28

## 2016-01-21 MED ORDER — TRAZODONE HCL 50 MG PO TABS
50.0000 mg | ORAL_TABLET | Freq: Every evening | ORAL | Status: DC | PRN
  Administered 2016-01-21 – 2016-01-27 (×4): 50 mg via ORAL
  Filled 2016-01-21 (×4): qty 1

## 2016-01-21 NOTE — Progress Notes (Addendum)
Physical Therapy Session Note  Patient Details  Name: James GentaJarvis George Sigala MRN: [id number] Date of Birth: 10/31/1960  Today's Date: 01/21/2016 PT Individual Time: 1305-1420 PT Individual Time Calculation (min): 75 min    Skilled Therapeutic Interventions/Progress Updates:    Pt asleep but able to be aroused to participate in session despite being frustrated that he just fell asleep. Focused on bed mobility re-training with cues for technique and attention to the L to get to EOB with mod assist for trunk. Dynamic sitting balance with supervision and cues for safety (not to transfer in the chair) while PT assisted with donning of shoes - difficulty with L shoe due to L toe extension. Pt completed min assist squat pivot transfer to the R into w/c with PT blocking L ankle due to noted instability (inverting). Pt reports this hasn't been an issue and only happened a few times. Plan to progress with gait training with HW and L walk on AFO but difficulty getting correct fit with AFO and shoe due to increased big toe extension, ankle inversion and plantarflexion tone.sit <> stand with min assist and cues for hand placement and technique. Attempted to have pt in standing to see with weightbearing would decrease tone but not successful. Further assessment and noted to have increased edema and reports discomfort in standing/weightbearing stating this was not an issue yesterday during PT session (PT who worked with patient yesterday came to observe and reports this is a change from yesterday). Pt reports that he still hasn't received PRAFO boot that therapists have asked for (notified PA and order placed) but also reports he rolled his ankle the other (L). Pt denies tenderness to the touch when stressed. Notified PA of these observations and limiting pt's ability to stand today. Focused on neuro re-ed for transfer training (L and R) and Nustep for reciprocal movement pattern retraining in BLE x 5 min on level 4.  Attempted to use LUE hand splint but pt unable to tolerate, so PT supported UE and focused on BLE and RUE with pt goal to attend to L where time meter is located and checked in with patient each minute to maintain attention to the L. End of session transferred back to the bed with all needs in reach with cues for sequencing, safety, and hand placement. Splint for LUE reapplied.    Therapy Documentation Precautions:  Precautions Precautions: Fall Precaution Comments: L hemiplegia, R hemicraniotomy  Required Braces or Orthoses: Other Brace/Splint Other Brace/Splint: Helmet when OOB Restrictions Weight Bearing Restrictions: No   Pain:  reports some pain in LLE and R hip. Monitored during session and discussed with PA after session.   See Function Navigator for Current Functional Status.   Therapy/Group: Individual Therapy   Karolee StampsGray, Danell Verno Darrol PokeBrescia  Novalynn Branaman B. Makenize Messman, PT, DPT  01/21/2016, 3:28 PM

## 2016-01-21 NOTE — Progress Notes (Signed)
Nutrition Follow-up  DOCUMENTATION CODES:   Not applicable  INTERVENTION:  Continue Glucerna Shake po BID, each supplement provides 220 kcal and 10 grams of protein  Provide nourishment snacks in between meals.   Encourage adequate PO intake.   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  Plans for PEG to be removed tomorrow. Meal completion has been mostly 80-100%, however po 0% at lunch today. Pt currently has Glucerna Shake ordered and has been consuming most of them. RD to continue with current orders to aid in caloric and protein needs.   Diet Order:  Diet Carb Modified Fluid consistency: Thin; Room service appropriate? Yes Diet NPO time specified  Skin:  Reviewed, no issues  Last BM:  12/3  Height:   Ht Readings from Last 1 Encounters:  12/27/15 5' 10"  (1.778 m)    Weight:   Wt Readings from Last 1 Encounters:  01/21/16 199 lb 14.4 oz (90.7 kg)    Ideal Body Weight:  75.5 kg  BMI:  Body mass index is 28.68 kg/m.  Estimated Nutritional Needs:   Kcal:  2100-2300  Protein:  100-115 grams  Fluid:  2.1-2.3 L  EDUCATION NEEDS:   Education needs addressed  Corrin Parker, MS, RD, LDN Pager # [phone] After hours/ weekend pager # [phone]

## 2016-01-21 NOTE — Progress Notes (Signed)
Social Work Patient ID: James Holden, male   DOB: 05/25/1960, 55 y.o.   MRN: [id number]  Spoke with wife who reports pt called her and told her his RN wanted to talk to her. Found katie-RN and asked She reports no issues, he is getting his PEG tube out tomorrow, but didn't not need to speak to wife. Wife to be up later and see pt, made pt aware of this.

## 2016-01-21 NOTE — Progress Notes (Signed)
Orthopedic Tech Progress Note Patient Details:  Janey GentaJarvis George Bloodsaw 08/17/1960 [id number]  Patient ID: Janey GentaJarvis George Flegal, male   DOB: 11/11/1960, 55 y.o.   MRN: [id number]   Nikki DomCrawford, Jamal Haskin 01/21/2016, 3:58 PM Called [facility] advanced brace order; [facility]

## 2016-01-21 NOTE — Progress Notes (Signed)
Subjective/Complaints: Per RN having trouble with sleep , had been on trazodone but this has been d/ced   ROS: pt denies nausea, vomiting, diarrhea, cough, shortness of breath or chest pain   Objective: Vital Signs: Blood pressure 129/64, pulse (!) 42, temperature 98.2 F (36.8 C), temperature source Oral, resp. rate 18, height 5\' 10"  (1.778 m), weight 90.7 kg (199 lb 14.4 oz), SpO2 97 %. No results found. Results for orders placed or performed during the hospital encounter of 12/27/15 (from the past 72 hour(s))  Glucose, capillary     Status: Abnormal   Collection Time: 01/18/16 12:03 PM  Result Value Ref Range   Glucose-Capillary 144 (H) 65 - 99 mg/dL   Comment 1 Notify RN   Glucose, capillary     Status: Abnormal   Collection Time: 01/18/16  4:37 PM  Result Value Ref Range   Glucose-Capillary 184 (H) 65 - 99 mg/dL   Comment 1 Notify RN   Glucose, capillary     Status: Abnormal   Collection Time: 01/18/16  9:19 PM  Result Value Ref Range   Glucose-Capillary 162 (H) 65 - 99 mg/dL  Glucose, capillary     Status: Abnormal   Collection Time: 01/19/16  6:39 AM  Result Value Ref Range   Glucose-Capillary 149 (H) 65 - 99 mg/dL  Glucose, capillary     Status: Abnormal   Collection Time: 01/19/16 11:49 AM  Result Value Ref Range   Glucose-Capillary 160 (H) 65 - 99 mg/dL  Glucose, capillary     Status: Abnormal   Collection Time: 01/19/16  4:56 PM  Result Value Ref Range   Glucose-Capillary 115 (H) 65 - 99 mg/dL  Glucose, capillary     Status: Abnormal   Collection Time: 01/19/16  8:31 PM  Result Value Ref Range   Glucose-Capillary 198 (H) 65 - 99 mg/dL   Comment 1 Notify RN   Glucose, capillary     Status: Abnormal   Collection Time: 01/20/16  6:43 AM  Result Value Ref Range   Glucose-Capillary 133 (H) 65 - 99 mg/dL   Comment 1 Notify RN   Glucose, capillary     Status: Abnormal   Collection Time: 01/20/16 11:37 AM  Result Value Ref Range   Glucose-Capillary 135 (H) 65  - 99 mg/dL  Glucose, capillary     Status: Abnormal   Collection Time: 01/20/16  4:25 PM  Result Value Ref Range   Glucose-Capillary 161 (H) 65 - 99 mg/dL  Glucose, capillary     Status: Abnormal   Collection Time: 01/20/16  8:39 PM  Result Value Ref Range   Glucose-Capillary 198 (H) 65 - 99 mg/dL  Glucose, capillary     Status: Abnormal   Collection Time: 01/21/16  6:37 AM  Result Value Ref Range   Glucose-Capillary 133 (H) 65 - 99 mg/dL     Physical Exam: General: NAD ENT- former trach site CDI,  Psych: Mood and affect are appropriate Head: craniectomy site without swelling noted Heart: RRR no jvd Lungs: clear with reg effort Abdomen: +BS, soft. PEG site remains CDI Musc: no edema. No tenderness. Skin: skin intact. Neurologic: left inattention and field cut Motor strength is 5/5 in Right  deltoid, bicep, tricep, grip, hip flexor, knee extensors, ankle dorsiflexor and plantar flexor 2-/5 Left hip flexion  LUE: 0/5 prox to distal.  Sensation diminished to light touch LUE  Assessment/Plan: 1. Functional deficits secondary to RIght MCA infarct with left hemiplegia which require 3+ hours per day  of interdisciplinary therapy in a comprehensive inpatient rehab setting. Physiatrist is providing close team supervision and 24 hour management of active medical problems listed below. Physiatrist and rehab team continue to assess barriers to discharge/monitor patient progress toward functional and medical goals. FIM: Function - Bathing Position: Wheelchair/chair at sink Body parts bathed by patient: Left arm, Chest, Abdomen, Front perineal area, Right upper leg, Left upper leg Body parts bathed by helper: Left lower leg, Buttocks, Right arm, Right lower leg, Back Bathing not applicable: Front perineal area, Buttocks (did not attempt this session) Assist Level: Touching or steadying assistance(Pt > 75%)  Function- Upper Body Dressing/Undressing What is the patient wearing?: Pull over  shirt/dress Pull over shirt/dress - Perfomed by patient: Thread/unthread right sleeve, Put head through opening Pull over shirt/dress - Perfomed by helper: Thread/unthread left sleeve, Pull shirt over trunk Assist Level: Touching or steadying assistance(Pt > 75%) Function - Lower Body Dressing/Undressing What is the patient wearing?: Pants, Faythe Dingwalled Hose, Shoes Position: Research officer, trade unionWheelchair/chair at Harrah's Entertainmentsink Pants- Performed by patient: Thread/unthread right pants leg Pants- Performed by helper: Pull pants up/down, Thread/unthread left pants leg Non-skid slipper socks- Performed by helper: Don/doff right sock Socks - Performed by patient: Don/doff right sock, Don/doff left sock Socks - Performed by helper: Don/doff right sock, Don/doff left sock Shoes - Performed by patient: Don/doff right shoe Shoes - Performed by helper: Don/doff left shoe, Fasten right, Fasten left TED Hose - Performed by helper: Don/doff right TED hose, Don/doff left TED hose Assist for footwear: Partial/moderate assist Assist for lower body dressing: Touching or steadying assistance (Pt > 75%)  Function - Toileting Toileting activity did not occur: Safety/medical concerns Toileting steps completed by helper: Adjust clothing prior to toileting, Performs perineal hygiene, Adjust clothing after toileting Toileting Assistive Devices: Other (comment) (lift) Assist level: Two helpers  Function - ArchivistToilet Transfers Toilet transfer activity did not occur: Safety/medical concerns Toilet transfer assistive device: Systems developerMechanical lift Mechanical lift: Forensic scientistara Assist level to toilet: 2 helpers Assist level from toilet: 2 helpers  Function - Chair/bed transfer Chair/bed transfer method: Lateral scoot Chair/bed transfer assist level: Touching or steadying assistance (Pt > 75%) Chair/bed transfer assistive device: Sliding board, Armrests Mechanical lift: Stedy Chair/bed transfer details: Verbal cues for technique, Tactile cues for weight shifting,  Tactile cues for sequencing, Verbal cues for sequencing, Manual facilitation for weight shifting  Function - Locomotion: Wheelchair Will patient use wheelchair at discharge?: Yes Type: Manual Max wheelchair distance: 13250ft  Assist Level: Supervision or verbal cues Assist Level: Supervision or verbal cues Wheel 150 feet activity did not occur: Safety/medical concerns Assist Level: Supervision or verbal cues Turns around,maneuvers to table,bed, and toilet,negotiates 3% grade,maneuvers on rugs and over doorsills: No Function - Locomotion: Ambulation Ambulation activity did not occur: Safety/medical concerns Assistive device: Parallel bars, Orthosis Max distance: 3810ft  Assist level: 2 helpers Walk 10 feet activity did not occur: Safety/medical concerns Assist level: Moderate assist (Pt 50 - 74%) Walk 50 feet with 2 turns activity did not occur: Safety/medical concerns Walk 150 feet activity did not occur: Safety/medical concerns Walk 10 feet on uneven surfaces activity did not occur: Safety/medical concerns  Function - Comprehension Comprehension: Auditory Comprehension assist level: Follows basic conversation/direction with extra time/assistive device  Function - Expression Expression: Verbal Expression assist level: Expresses basic needs/ideas: With extra time/assistive device  Function - Social Interaction Social Interaction assist level: Interacts appropriately 75 - 89% of the time - Needs redirection for appropriate language or to initiate interaction.  Function - Problem Solving Problem  solving assist level: Solves basic 25 - 49% of the time - needs direction more than half the time to initiate, plan or complete simple activities  Function - Memory Memory assist level: Recognizes or recalls 50 - 74% of the time/requires cueing 25 - 49% of the time Patient normally able to recall (first 3 days only): Current season, That he or she is in a hospital  Medical Problem List and  Plan: 1.  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  .             -continue PT, OT, SLP  -ELOS 01/28/16, Team conf in am 2.  DVT Prophylaxis/Anticoagulation: Subcutaneous heparin for DVT prophylaxis 3. Pain Management/chronic back pain: Oxycodone 5 mg every 6 hours as needed  -continue baclofen prn for cramps/spasms.    -Left hand and wrist pain improved with splint        4. Mood: Prozac 40 mg daily.  Abilify 5 mg daily 5. Neuropsych: This patient is capable of making decisions on his own behalf.Poor attn: increased ritalin 10mg  BID 6. Skin/Wound Care: Routine skin checks 7. Fluids/Electrolytes/Nutrition: appreciate dietary note 8.H/O  Tracheostomy Decannulated 12/25/2015--healed 9. Gastrostomy PEG tube 12/05/2015. Nocturnal tube feeds recently discontinued. Currently on dysphagia #3/thin.              -Plan D/C PEG in am, NPO p midnoc 10. Hypertension.     -continue amlodipine 5 mg daily, lisinopril 30 mg daily with hold parameters Some fluctuations/lability Vitals:   01/20/16 1304 01/21/16 0409  BP: (!) 132/57 129/64  Pulse: (!) 43 (!) 42  Resp: 18 18  Temp: 98 F (36.7 C) 98.2 F (36.8 C)   11. Diabetes mellitus.  Intake is good  Check blood sugars before meals and at bedtime  Fair control at present.  increase lantus  to 28 units for tighter control  Recent Labs  01/20/16 1625 01/20/16 2039 01/21/16 0637  GLUCAP 161* 198* 133*   12. Hyperlipidemia. Lipitor 80 mg daily at bedtime 13.Tobacco abuse. Counseling as appropriate 14.  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 ?RLS- pt c/o symptoms BLE prior to CVA   -low dose requip remains effective, will add trazodone for  sleep    LOS (Days) 25 A FACE TO FACE EVALUATION WAS PERFORMED  Kadijah Shamoon E 01/21/2016, 8:55 AM

## 2016-01-21 NOTE — Progress Notes (Signed)
Occupational Therapy Session Note  Patient Details  Name: James Holden MRN: [id number] Date of Birth: 04/10/1960  Today's Date: 01/21/2016 OT Individual Time: 0902-1002 OT Individual Time Calculation (min): 60 min     Short Term Goals: Week 4:  OT Short Term Goal 1 (Week 4): STGs = LTGs  Skilled Therapeutic Interventions/Progress Updates:    Pt completed sponge bathe and dressing sit to stand at the sink secondary to not wanting to take a shower.  Noted pt with "blah" mood as he stated.  Mod assist for transfer to the EOB from supine with mod demonstrational cueing.  He was able to complete squat pivot transfer to the right for transfer to wheelchair.  Completed bathing with max hand over hand assist integrating the LUE.  He was able to wash all other parts.  Noted pt doing a better job of scanning left of midline and turning his head and eyes.  He still needs cueing at a mod assist level to complete this however, but is improved from max assist.  Still with visual processing dysfunction related to dressing tasks with decreased ability to determine parts of clothing secondary to visual field deficit.  Mod instructional cueing for hemi techniques to donn pullover shirt and for pants.  Therapist applied kinesiotape to the left shoulder to help reduce subluxation.  Pt left with SLP for next session.  Therapy Documentation Precautions:  Precautions Precautions: Fall Precaution Comments: L hemiplegia, R hemicraniotomy  Required Braces or Orthoses: Other Brace/Splint Other Brace/Splint: Helmet when OOB Restrictions Weight Bearing Restrictions: No  Pain: Pain Assessment Pain Assessment: No/denies pain Faces Pain Scale: Hurts a little bit Pain Type: Acute pain Pain Location: Arm Pain Orientation: Left Pain Intervention(s): Repositioned Multiple Pain Sites: No ADL: See Function Navigator for Current Functional Status.   Therapy/Group: Individual Therapy  Keaston Pile  OTR/L 01/21/2016, 12:15 PM

## 2016-01-21 NOTE — Progress Notes (Signed)
Speech Language Pathology Daily Session Note  Patient Details  Name: James Holden MRN: [id number] Date of Birth: 10/02/1960  Today's Date: 01/21/2016 SLP Individual Time: 1003-1100 SLP Individual Time Calculation (min): 57 min   Short Term Goals: Week 4: SLP Short Term Goal 1 (Week 4): Patient will consume regular textures and thin liquids without overt s/s of aspiration with Mod I for use of swallowing compensatory strategies.  SLP Short Term Goal 2 (Week 4): Patient will maintain eye contact at midline to conversation partner during functional conversation/task for >1 minute in >50% of opportunities with Mod A verbal and question cues.  SLP Short Term Goal 3 (Week 4): Patient will perform visual scanning tasks to locate items at midline with min A multimodal cues in 75% of opportunities.  SLP Short Term Goal 4 (Week 4): Patient will demonstrate basic problem solving in functional tasks with min A verbal and question cues.  Skilled Therapeutic Interventions: Pt was seen for skilled ST targeting cognitive goals.  Pt received from OT therapy session with complaints of fatigue.  Affect still appearing dull, OT reports requesting neuropsych consult for pt.  Therapist facilitated the session with a previously taught card game to address goals for problem solving and visual scanning to the left.  Pt recalled rules of game with supervision question cues but needed mod assist verbal cues to attend to his cards on the left.  Once attending to cards on his left, pt was able to plan and execute a problem solving strategy with min-mod assist verbal and visual cues.  Pt was able to selectively attend to task in a moderately distracting environment (music playing) for ~2 minute intervals with min verbal cues needed for redirection.  Pt was returned to room and left with call bell within reach and bed alarm set.  Continue per current plan of care.       Function:  Eating Eating                  Cognition Comprehension Comprehension assist level: Follows basic conversation/direction with extra time/assistive device  Expression   Expression assist level: Expresses basic needs/ideas: With extra time/assistive device  Social Interaction Social Interaction assist level: Interacts appropriately 75 - 89% of the time - Needs redirection for appropriate language or to initiate interaction.  Problem Solving Problem solving assist level: Solves basic 50 - 74% of the time/requires cueing 25 - 49% of the time  Memory Memory assist level: Recognizes or recalls 75 - 89% of the time/requires cueing 10 - 24% of the time    Pain Pain Assessment Pain Assessment: No/denies pain  Therapy/Group: Individual Therapy  Terius Jacuinde, Joni ReiningNicole L 01/21/2016, 11:00 AM

## 2016-01-21 NOTE — Progress Notes (Signed)
Subjective/Complaints: Per RN having trouble with sleep , had been on trazodone but this has been d/ced   ROS: pt denies nausea, vomiting, diarrhea, cough, shortness of breath or chest pain   Objective: Vital Signs: Blood pressure 129/64, pulse (!) 42, temperature 98.2 F (36.8 C), temperature source Oral, resp. rate 18, height 5\' 10"  (1.778 m), weight 90.7 kg (199 lb 14.4 oz), SpO2 97 %. No results found. Results for orders placed or performed during the hospital encounter of 12/27/15 (from the past 72 hour(s))  Glucose, capillary     Status: Abnormal   Collection Time: 01/18/16 12:03 PM  Result Value Ref Range   Glucose-Capillary 144 (H) 65 - 99 mg/dL   Comment 1 Notify RN   Glucose, capillary     Status: Abnormal   Collection Time: 01/18/16  4:37 PM  Result Value Ref Range   Glucose-Capillary 184 (H) 65 - 99 mg/dL   Comment 1 Notify RN   Glucose, capillary     Status: Abnormal   Collection Time: 01/18/16  9:19 PM  Result Value Ref Range   Glucose-Capillary 162 (H) 65 - 99 mg/dL  Glucose, capillary     Status: Abnormal   Collection Time: 01/19/16  6:39 AM  Result Value Ref Range   Glucose-Capillary 149 (H) 65 - 99 mg/dL  Glucose, capillary     Status: Abnormal   Collection Time: 01/19/16 11:49 AM  Result Value Ref Range   Glucose-Capillary 160 (H) 65 - 99 mg/dL  Glucose, capillary     Status: Abnormal   Collection Time: 01/19/16  4:56 PM  Result Value Ref Range   Glucose-Capillary 115 (H) 65 - 99 mg/dL  Glucose, capillary     Status: Abnormal   Collection Time: 01/19/16  8:31 PM  Result Value Ref Range   Glucose-Capillary 198 (H) 65 - 99 mg/dL   Comment 1 Notify RN   Glucose, capillary     Status: Abnormal   Collection Time: 01/20/16  6:43 AM  Result Value Ref Range   Glucose-Capillary 133 (H) 65 - 99 mg/dL   Comment 1 Notify RN   Glucose, capillary     Status: Abnormal   Collection Time: 01/20/16 11:37 AM  Result Value Ref Range   Glucose-Capillary 135 (H) 65  - 99 mg/dL  Glucose, capillary     Status: Abnormal   Collection Time: 01/20/16  4:25 PM  Result Value Ref Range   Glucose-Capillary 161 (H) 65 - 99 mg/dL  Glucose, capillary     Status: Abnormal   Collection Time: 01/20/16  8:39 PM  Result Value Ref Range   Glucose-Capillary 198 (H) 65 - 99 mg/dL  Glucose, capillary     Status: Abnormal   Collection Time: 01/21/16  6:37 AM  Result Value Ref Range   Glucose-Capillary 133 (H) 65 - 99 mg/dL     Physical Exam: General: NAD ENT- former trach site CDI,  Psych: Mood and affect are appropriate Head: craniectomy site without swelling noted Heart: RRR no jvd Lungs: clear with reg effort Abdomen: +BS, soft. PEG site remains CDI Musc: no edema. No tenderness. Skin: skin intact. Neurologic: left inattention and field cut Motor strength is 5/5 in Right  deltoid, bicep, tricep, grip, hip flexor, knee extensors, ankle dorsiflexor and plantar flexor 2-/5 Left hip flexion  LUE: 0/5 prox to distal.  Sensation diminished to light touch LUE  Assessment/Plan: 1. Functional deficits secondary to RIght MCA infarct with left hemiplegia which require 3+ hours per day  of interdisciplinary therapy in a comprehensive inpatient rehab setting. Physiatrist is providing close team supervision and 24 hour management of active medical problems listed below. Physiatrist and rehab team continue to assess barriers to discharge/monitor patient progress toward functional and medical goals. FIM: Function - Bathing Position: Wheelchair/chair at sink Body parts bathed by patient: Left arm, Chest, Abdomen, Front perineal area, Right upper leg, Left upper leg Body parts bathed by helper: Left lower leg, Buttocks, Right arm, Right lower leg, Back Bathing not applicable: Front perineal area, Buttocks (did not attempt this session) Assist Level: Touching or steadying assistance(Pt > 75%)  Function- Upper Body Dressing/Undressing What is the patient wearing?: Pull over  shirt/dress Pull over shirt/dress - Perfomed by patient: Thread/unthread right sleeve, Put head through opening Pull over shirt/dress - Perfomed by helper: Thread/unthread left sleeve, Pull shirt over trunk Assist Level: Touching or steadying assistance(Pt > 75%) Function - Lower Body Dressing/Undressing What is the patient wearing?: Pants, Faythe Dingwalled Hose, Shoes Position: Research officer, trade unionWheelchair/chair at Harrah's Entertainmentsink Pants- Performed by patient: Thread/unthread right pants leg Pants- Performed by helper: Pull pants up/down, Thread/unthread left pants leg Non-skid slipper socks- Performed by helper: Don/doff right sock Socks - Performed by patient: Don/doff right sock, Don/doff left sock Socks - Performed by helper: Don/doff right sock, Don/doff left sock Shoes - Performed by patient: Don/doff right shoe Shoes - Performed by helper: Don/doff left shoe, Fasten right, Fasten left TED Hose - Performed by helper: Don/doff right TED hose, Don/doff left TED hose Assist for footwear: Partial/moderate assist Assist for lower body dressing: Touching or steadying assistance (Pt > 75%)  Function - Toileting Toileting activity did not occur: Safety/medical concerns Toileting steps completed by helper: Adjust clothing prior to toileting, Performs perineal hygiene, Adjust clothing after toileting Toileting Assistive Devices: Other (comment) (lift) Assist level: Two helpers  Function - ArchivistToilet Transfers Toilet transfer activity did not occur: Safety/medical concerns Toilet transfer assistive device: Systems developerMechanical lift Mechanical lift: Forensic scientistara Assist level to toilet: 2 helpers Assist level from toilet: 2 helpers  Function - Chair/bed transfer Chair/bed transfer method: Lateral scoot Chair/bed transfer assist level: Touching or steadying assistance (Pt > 75%) Chair/bed transfer assistive device: Sliding board, Armrests Mechanical lift: Stedy Chair/bed transfer details: Verbal cues for technique, Tactile cues for weight shifting,  Tactile cues for sequencing, Verbal cues for sequencing, Manual facilitation for weight shifting  Function - Locomotion: Wheelchair Will patient use wheelchair at discharge?: Yes Type: Manual Max wheelchair distance: 1350ft  Assist Level: Supervision or verbal cues Assist Level: Supervision or verbal cues Wheel 150 feet activity did not occur: Safety/medical concerns Assist Level: Supervision or verbal cues Turns around,maneuvers to table,bed, and toilet,negotiates 3% grade,maneuvers on rugs and over doorsills: No Function - Locomotion: Ambulation Ambulation activity did not occur: Safety/medical concerns Assistive device: Parallel bars, Orthosis Max distance: 2410ft  Assist level: 2 helpers Walk 10 feet activity did not occur: Safety/medical concerns Assist level: Moderate assist (Pt 50 - 74%) Walk 50 feet with 2 turns activity did not occur: Safety/medical concerns Walk 150 feet activity did not occur: Safety/medical concerns Walk 10 feet on uneven surfaces activity did not occur: Safety/medical concerns  Function - Comprehension Comprehension: Auditory Comprehension assist level: Follows basic conversation/direction with extra time/assistive device  Function - Expression Expression: Verbal Expression assist level: Expresses basic needs/ideas: With extra time/assistive device  Function - Social Interaction Social Interaction assist level: Interacts appropriately 75 - 89% of the time - Needs redirection for appropriate language or to initiate interaction.  Function - Problem Solving Problem  solving assist level: Solves basic 25 - 49% of the time - needs direction more than half the time to initiate, plan or complete simple activities  Function - Memory Memory assist level: Recognizes or recalls 50 - 74% of the time/requires cueing 25 - 49% of the time Patient normally able to recall (first 3 days only): Current season, That he or she is in a hospital  Medical Problem List and  Plan: 1.  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  .             -continue PT, OT, SLP  -ELOS 01/28/16, Team conf in am 2.  DVT Prophylaxis/Anticoagulation: Subcutaneous heparin for DVT prophylaxis 3. Pain Management/chronic back pain: Oxycodone 5 mg every 6 hours as needed  -continue baclofen prn for cramps/spasms.    -Left hand and wrist pain improved with splint        4. Mood: Prozac 40 mg daily.  Abilify 5 mg daily 5. Neuropsych: This patient is capable of making decisions on his own behalf.Poor attn: increased ritalin 10mg  BID 6. Skin/Wound Care: Routine skin checks 7. Fluids/Electrolytes/Nutrition: appreciate dietary note 8.H/O  Tracheostomy Decannulated 12/25/2015--healed 9. Gastrostomy PEG tube 12/05/2015. Nocturnal tube feeds recently discontinued. Currently on dysphagia #3/thin.              -Plan D/C PEG in am, NPO p midnoc 10. Hypertension.     -continue amlodipine 5 mg daily, lisinopril 30 mg daily with hold parameters Some fluctuations/lability Vitals:   01/20/16 1304 01/21/16 0409  BP: (!) 132/57 129/64  Pulse: (!) 43 (!) 42  Resp: 18 18  Temp: 98 F (36.7 C) 98.2 F (36.8 C)   11. Diabetes mellitus.  Intake is good  Check blood sugars before meals and at bedtime  Fair control at present.  increase lantus  to 28 units for tighter control  Recent Labs  01/20/16 1625 01/20/16 2039 01/21/16 0637  GLUCAP 161* 198* 133*   12. Hyperlipidemia. Lipitor 80 mg daily at bedtime 13.Tobacco abuse. Counseling as appropriate 14.  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 ?RLS- pt c/o symptoms BLE prior to CVA   -low dose requip remains effective, will add trazodone for  sleep    LOS (Days) 25 A FACE TO FACE EVALUATION WAS PERFORMED  James Holden,James Holden 01/21/2016, 7:56 AM

## 2016-01-22 ENCOUNTER — Inpatient Hospital Stay (HOSPITAL_COMMUNITY): Payer: BLUE CROSS/BLUE SHIELD | Admitting: Occupational Therapy

## 2016-01-22 ENCOUNTER — Inpatient Hospital Stay (HOSPITAL_COMMUNITY): Payer: BLUE CROSS/BLUE SHIELD | Admitting: Speech Pathology

## 2016-01-22 ENCOUNTER — Inpatient Hospital Stay (HOSPITAL_COMMUNITY): Payer: BLUE CROSS/BLUE SHIELD

## 2016-01-22 LAB — URINALYSIS, ROUTINE W REFLEX MICROSCOPIC
Bilirubin Urine: NEGATIVE
GLUCOSE, UA: NEGATIVE mg/dL
HGB URINE DIPSTICK: NEGATIVE
KETONES UR: NEGATIVE mg/dL
LEUKOCYTES UA: NEGATIVE
Nitrite: NEGATIVE
PROTEIN: NEGATIVE mg/dL
Specific Gravity, Urine: 1.015 (ref 1.005–1.030)
pH: 5 (ref 5.0–8.0)

## 2016-01-22 LAB — GLUCOSE, CAPILLARY
GLUCOSE-CAPILLARY: 145 mg/dL — AB (ref 65–99)
GLUCOSE-CAPILLARY: 128 mg/dL — AB (ref 65–99)
Glucose-Capillary: 163 mg/dL — ABNORMAL HIGH (ref 65–99)
Glucose-Capillary: 183 mg/dL — ABNORMAL HIGH (ref 65–99)

## 2016-01-22 MED ORDER — BACLOFEN 5 MG HALF TABLET
5.0000 mg | ORAL_TABLET | Freq: Four times a day (QID) | ORAL | Status: DC
  Administered 2016-01-22 – 2016-01-28 (×25): 5 mg via ORAL
  Filled 2016-01-22 (×25): qty 1

## 2016-01-22 NOTE — Progress Notes (Signed)
Occupational Therapy Session Note  Patient Details  Name: James GentaJarvis [name] MRN: [id number] Date of Birth: 02/14/1961  Today's Date: 01/22/2016 OT Individual Time: 1446-1530 OT Individual Time Calculation (min): 44 min     Short Term Goals: Week 4:  OT Short Term Goal 1 (Week 4): STGs = LTGs  Skilled Therapeutic Interventions/Progress Updates:    Pt started session with finishing working on lunch.  Noted pt missing half of main item on the left side of the plate.  Therapist provided diet coke that pt wanted with item placed on the left side of the tray.  Mod instructional cueing to scan left of midline to locate cup.  Therapist next applied NMES to the digit flexors and extensors for 5 mins each.  He reported increased pain in the wrist and digits intermittently for digit flexion with intensity on level 28 with PPS at 50 and pulse width at 300.  Digit extension completed at the same settings as previously mentioned except for intensity which was increased to level 38.  [facility] and extension with elbow flexion.  Noted trace shoulder flexion with greater movement in extension, but still only trace as well.  Finished session with squat pivot transfer back to bed with mod assist to the left.  Pt transitioned to supine with mod assist as well.  Placed resting hand splint on the left hand and positioned phone and call button in reach.  PRAFO also in place on the LLE.    Therapy Documentation Precautions:  Precautions Precautions: Fall Precaution Comments: L hemiplegia, R hemicraniotomy  Required Braces or Orthoses: Other Brace/Splint Other Brace/Splint: Helmet when OOB Restrictions Weight Bearing Restrictions: No  Pain: Pain Assessment Pain Assessment: Faces Faces Pain Scale: Hurts a little bit Pain Type: Acute pain Pain Location: Wrist Pain Orientation: Left Pain Descriptors / Indicators: Discomfort Pain Onset: With Activity Pain Intervention(s):  Repositioned ADL: See Function Navigator for Current Functional Status.   Therapy/Group: Individual Therapy  Dorethy Tomey OTR/L 01/22/2016, 4:17 PM

## 2016-01-22 NOTE — Progress Notes (Signed)
 Subjective/Complaints:   ROS: pt denies nausea, vomiting, diarrhea, cough, shortness of breath or chest pain   Objective: Vital Signs: Blood pressure (!) 133/53, pulse (!) 42, temperature 98.2 F (36.8 C), temperature source Oral, resp. rate 18, height 5' 10" (1.778 m), weight 90.7 kg (199 lb 15.3 oz), SpO2 98 %. No results found. Results for orders placed or performed during the hospital encounter of 12/27/15 (from the past 72 hour(s))  Glucose, capillary     Status: Abnormal   Collection Time: 01/19/16 11:49 AM  Result Value Ref Range   Glucose-Capillary 160 (H) 65 - 99 mg/dL  Glucose, capillary     Status: Abnormal   Collection Time: 01/19/16  4:56 PM  Result Value Ref Range   Glucose-Capillary 115 (H) 65 - 99 mg/dL  Glucose, capillary     Status: Abnormal   Collection Time: 01/19/16  8:31 PM  Result Value Ref Range   Glucose-Capillary 198 (H) 65 - 99 mg/dL   Comment 1 Notify RN   Glucose, capillary     Status: Abnormal   Collection Time: 01/20/16  6:43 AM  Result Value Ref Range   Glucose-Capillary 133 (H) 65 - 99 mg/dL   Comment 1 Notify RN   Glucose, capillary     Status: Abnormal   Collection Time: 01/20/16 11:37 AM  Result Value Ref Range   Glucose-Capillary 135 (H) 65 - 99 mg/dL  Glucose, capillary     Status: Abnormal   Collection Time: 01/20/16  4:25 PM  Result Value Ref Range   Glucose-Capillary 161 (H) 65 - 99 mg/dL  Glucose, capillary     Status: Abnormal   Collection Time: 01/20/16  8:39 PM  Result Value Ref Range   Glucose-Capillary 198 (H) 65 - 99 mg/dL  Glucose, capillary     Status: Abnormal   Collection Time: 01/21/16  6:37 AM  Result Value Ref Range   Glucose-Capillary 133 (H) 65 - 99 mg/dL  Glucose, capillary     Status: Abnormal   Collection Time: 01/21/16 11:47 AM  Result Value Ref Range   Glucose-Capillary 195 (H) 65 - 99 mg/dL   Comment 1 Notify RN   Glucose, capillary     Status: Abnormal   Collection Time: 01/21/16  5:02 PM  Result  Value Ref Range   Glucose-Capillary 111 (H) 65 - 99 mg/dL   Comment 1 Notify RN   Glucose, capillary     Status: Abnormal   Collection Time: 01/21/16  8:46 PM  Result Value Ref Range   Glucose-Capillary 143 (H) 65 - 99 mg/dL  Urinalysis, Routine w reflex microscopic     Status: None   Collection Time: 01/22/16  6:41 AM  Result Value Ref Range   Color, Urine YELLOW YELLOW   APPearance CLEAR CLEAR   Specific Gravity, Urine 1.015 1.005 - 1.030   pH 5.0 5.0 - 8.0   Glucose, UA NEGATIVE NEGATIVE mg/dL   Hgb urine dipstick NEGATIVE NEGATIVE   Bilirubin Urine NEGATIVE NEGATIVE   Ketones, ur NEGATIVE NEGATIVE mg/dL   Protein, ur NEGATIVE NEGATIVE mg/dL   Nitrite NEGATIVE NEGATIVE   Leukocytes, UA NEGATIVE NEGATIVE  Glucose, capillary     Status: Abnormal   Collection Time: 01/22/16  6:47 AM  Result Value Ref Range   Glucose-Capillary 145 (H) 65 - 99 mg/dL     Physical Exam: General: NAD ENT- former trach site CDI,  Psych: Mood and affect are appropriate Head: craniectomy site without swelling noted Heart: RRR no jvd   Lungs: clear with reg effort Abdomen: +BS, soft. PEG site remains CDI Musc: no edema. No tenderness. Skin: skin intact. Neurologic: left inattention and field cut Motor strength is 5/5 in Right  deltoid, bicep, tricep, grip, hip flexor, knee extensors, ankle dorsiflexor and plantar flexor 2-/5 Left hip flexion  LUE: 0/5 prox to distal.  Sensation diminished to light touch LUE  Assessment/Plan: 1. Functional deficits secondary to RIght MCA infarct with left hemiplegia which require 3+ hours per day of interdisciplinary therapy in a comprehensive inpatient rehab setting. Physiatrist is providing close team supervision and 24 hour management of active medical problems listed below. Physiatrist and rehab team continue to assess barriers to discharge/monitor patient progress toward functional and medical goals. FIM: Function - Bathing Position: Wheelchair/chair at  sink Body parts bathed by patient: Left arm, Chest, Abdomen, Front perineal area, Right upper leg, Left upper leg, Right lower leg, Buttocks Body parts bathed by helper: Back, Left lower leg Bathing not applicable: Front perineal area, Buttocks (did not attempt this session) Assist Level: Touching or steadying assistance(Pt > 75%)  Function- Upper Body Dressing/Undressing What is the patient wearing?: Pull over shirt/dress Pull over shirt/dress - Perfomed by patient: Thread/unthread right sleeve, Put head through opening, Pull shirt over trunk Pull over shirt/dress - Perfomed by helper: Thread/unthread left sleeve Assist Level: Touching or steadying assistance(Pt > 75%) Function - Lower Body Dressing/Undressing What is the patient wearing?: Pants, Ted Hose, Shoes Position: Wheelchair/chair at sink Pants- Performed by patient: Thread/unthread right pants leg Pants- Performed by helper: Thread/unthread left pants leg, Pull pants up/down Non-skid slipper socks- Performed by helper: Don/doff right sock Socks - Performed by patient: Don/doff right sock, Don/doff left sock Socks - Performed by helper: Don/doff right sock, Don/doff left sock Shoes - Performed by patient: Don/doff right shoe Shoes - Performed by helper: Don/doff left shoe, Fasten right, Fasten left TED Hose - Performed by patient: Don/doff right TED hose, Don/doff left TED hose TED Hose - Performed by helper: Don/doff right TED hose, Don/doff left TED hose Assist for footwear: Partial/moderate assist Assist for lower body dressing: Touching or steadying assistance (Pt > 75%)  Function - Toileting Toileting activity did not occur: Safety/medical concerns Toileting steps completed by helper: Adjust clothing prior to toileting, Performs perineal hygiene, Adjust clothing after toileting Toileting Assistive Devices: Other (comment) (lift) Assist level: Two helpers  Function - Toilet Transfers Toilet transfer activity did not  occur: Safety/medical concerns Toilet transfer assistive device: Mechanical lift Mechanical lift: Sara Assist level to toilet: 2 helpers Assist level from toilet: 2 helpers  Function - Chair/bed transfer Chair/bed transfer method: Squat pivot Chair/bed transfer assist level: Moderate assist (Pt 50 - 74%/lift or lower) Chair/bed transfer assistive device: Armrests Mechanical lift: Stedy Chair/bed transfer details: Verbal cues for technique, Tactile cues for weight shifting, Tactile cues for sequencing, Verbal cues for sequencing, Manual facilitation for weight shifting  Function - Locomotion: Wheelchair Will patient use wheelchair at discharge?: Yes Type: Manual Max wheelchair distance: 150ft  Assist Level: Supervision or verbal cues Assist Level: Supervision or verbal cues Wheel 150 feet activity did not occur: Safety/medical concerns Assist Level: Supervision or verbal cues Turns around,maneuvers to table,bed, and toilet,negotiates 3% grade,maneuvers on rugs and over doorsills: No Function - Locomotion: Ambulation Ambulation activity did not occur: Safety/medical concerns Assistive device: Parallel bars, Orthosis Max distance: 10ft  Assist level: 2 helpers Walk 10 feet activity did not occur: Safety/medical concerns Assist level: Moderate assist (Pt 50 - 74%) Walk 50 feet with 2   turns activity did not occur: Safety/medical concerns Walk 150 feet activity did not occur: Safety/medical concerns Walk 10 feet on uneven surfaces activity did not occur: Safety/medical concerns  Function - Comprehension Comprehension: Auditory Comprehension assist level: Follows basic conversation/direction with extra time/assistive device  Function - Expression Expression: Verbal Expression assist level: Expresses basic needs/ideas: With extra time/assistive device  Function - Social Interaction Social Interaction assist level: Interacts appropriately 75 - 89% of the time - Needs redirection for  appropriate language or to initiate interaction.  Function - Problem Solving Problem solving assist level: Solves basic 50 - 74% of the time/requires cueing 25 - 49% of the time  Function - Memory Memory assist level: Recognizes or recalls 75 - 89% of the time/requires cueing 10 - 24% of the time Patient normally able to recall (first 3 days only): Current season, That he or she is in a hospital  Medical Problem List and Plan: 1.  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  .             -continue PT, OT, SLP  -ELOS 01/28/16, Team conference today please see physician documentation under team conference tab, met with team face-to-face to discuss problems,progress, and goals. Formulized individual treatment plan based on medical history, underlying problem and comorbidities. 2.  DVT Prophylaxis/Anticoagulation: Subcutaneous heparin for DVT prophylaxis 3. Pain Management/chronic back pain: Oxycodone 5 mg every 6 hours as needed  -continue baclofen prn for cramps/spasms.    -Left hand and wrist pain improved with splint        4. Mood: Prozac 40 mg daily.  Abilify 5 mg daily 5. Neuropsych: This patient is capable of making decisions on his own behalf.Poor attn: increased ritalin 62m BID 6. Skin/Wound Care: Routine skin checks 7. Fluids/Electrolytes/Nutrition: appreciate dietary note 8.H/O  Tracheostomy Decannulated 12/25/2015--healed 9. Gastrostomy PEG tube 12/05/2015. Nocturnal tube feeds recently discontinued. Currently on dysphagia #3/thin.              -PEG d/ced with traction removal minimal bleeding. 4x4 dressing applied, may resume po in 4 hrs 10. Hypertension.     -continue amlodipine 5 mg daily, lisinopril 30 mg daily with hold parameters Some fluctuations/lability Vitals:   01/21/16 1523 01/22/16 0421  BP: (!) 124/46 (!) 133/53  Pulse: (!) 40 (!) 42  Resp: 18 18  Temp: 98.7 F (37.1 C) 98.2 F (36.8 C)   11. Diabetes  mellitus.  Intake is good  Check blood sugars before meals and at bedtime  Fair control at present.  increase lantus  to 28 units for tighter control  Recent Labs  01/21/16 1702 01/21/16 2046 01/22/16 0647  GLUCAP 111* 143* 145*   12. Hyperlipidemia. Lipitor 80 mg daily at bedtime 13.Tobacco abuse. Counseling as appropriate 14.  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 ?RLS- pt c/o symptoms BLE prior to CVA   -low dose requip remains effective, will add trazodone for sleep    LOS (Days) 26 A FACE TO FACE EVALUATION WAS PERFORMED  Kassius Battiste E 01/22/2016, 8:42 AM

## 2016-01-22 NOTE — Progress Notes (Signed)
Speech Language Pathology Daily Session Note  Patient Details  Name: James Holden George Sica MRN: [id number] Date of Birth: 08/08/1960  Today's Date: 01/22/2016 SLP Individual Time: 1205-1230 SLP Individual Time Calculation (min): 25 min   Short Term Goals:Week 4: SLP Short Term Goal 1 (Week 4): Patient will consume regular textures and thin liquids without overt s/s of aspiration with Mod I for use of swallowing compensatory strategies.  SLP Short Term Goal 2 (Week 4): Patient will maintain eye contact at midline to conversation partner during functional conversation/task for >1 minute in >50% of opportunities with Mod A verbal and question cues.  SLP Short Term Goal 3 (Week 4): Patient will perform visual scanning tasks to locate items at midline with min A multimodal cues in 75% of opportunities.  SLP Short Term Goal 4 (Week 4): Patient will demonstrate basic problem solving in functional tasks with min A verbal and question cues.  Skilled Therapeutic Interventions: Pt was seen for skilled ST targeting dysphagia goals.  Following set up of items, pt was able to consume regular textures and thin liquids with mod I use of swallowing precautions.  No overt s/s of aspiration were noted with solids or liquids.  No other safety concerns noted with PO intake.  Discussed with pt's nurse tech who reports pt has been consistently using his swallowing precautions during meals with set up assist only.  As a result, recommend upgrading pt to intermittent supervision with meals.  Pt verbalized understanding and was in agreement with recommended plan of care.  Pt left in bed with bed alarm set and call bell within reach.  Continue per current plan of care.       Function:  Eating Eating   Modified Consistency Diet: Yes Eating Assist Level: Set up assist for;Swallowing techniques: self managed   Eating Set Up Assist For: Opening containers       Cognition Comprehension Comprehension assist level: Follows  basic conversation/direction with no assist  Expression   Expression assist level: Expresses basic needs/ideas: With no assist  Social Interaction Social Interaction assist level: Interacts appropriately 75 - 89% of the time - Needs redirection for appropriate language or to initiate interaction.  Problem Solving Problem solving assist level: Solves basic 50 - 74% of the time/requires cueing 25 - 49% of the time  Memory Memory assist level: Recognizes or recalls 75 - 89% of the time/requires cueing 10 - 24% of the time    Pain Pain Assessment Pain Assessment: No/denies pain   Therapy/Group: Individual Therapy  Deandria Klute, Melanee SpryNicole L 01/22/2016, 12:32 PM

## 2016-01-22 NOTE — Progress Notes (Signed)
Occupational Therapy Session Note  Patient Details  Name: James Holden MRN: [id number] Date of Birth: 08/17/1960  Today's Date: 01/22/2016 OT Individual Time: [id number] OT Individual Time Calculation (min): 65 min     Short Term Goals: Week 4:  OT Short Term Goal 1 (Week 4): STGs = LTGs  Skilled Therapeutic Interventions/Progress Updates:    Pt with increased abdominal pain in bed this session bu agreed to participate.  Transferred to wheelchair at bedside with mod assist squat pivot to the left.  Once sitting pt reporting increased pain in the left foot and leg.  Noted flexor withdrawal in the hamstring as well a inversion and big toe extension with any movement or weightbearing in the LLE.  With attempts to stand during LB bathing and for LB dressing he needed multiple attempts secondary to not being able to tolerate weightbearing over the LLE.  Still demonstrates decreased attention to the left for locating items during bathing.  Mod instructional cueing to scan for location of washcloth and towel.  He also still requires max assist for integration of the LUE in bathing task as a stabilizer or active assist.  Still with visual perceptual issues when attempting to donn his clothing as he cannot identify the left side without max cueing.  Pt left in wheelchair with safety belt in place and call button in reach.  Pain and tone in LLE more limiting this session overall.   Therapy Documentation Precautions: Precautions Precautions: Fall Precaution Comments: L hemiplegia, R hemicraniotomy  Required Braces or Orthoses: Other Brace/Splint Other Brace/Splint: Helmet when OOB Restrictions Weight Bearing Restrictions: No  Pain: Pain Assessment Pain Assessment: Faces Faces Pain Scale: Hurts even more Pain Type: Acute pain Pain Location: Ankle Pain Descriptors / Indicators: Discomfort Pain Intervention(s): Repositioned;RN made aware ADL: See Function Navigator for Current Functional  Status.   Therapy/Group: Individual Therapy  Jerie Basford OTR/L 01/22/2016, 11:04 AM

## 2016-01-22 NOTE — Progress Notes (Signed)
Physical Therapy Note  Patient Details  Name: James Holden MRN: [id number] Date of Birth: 04/08/1960 Today's Date: 01/22/2016  1300-1400, 60 min individual tx Pain:  LLE due to spqsticity; started on Baclofen today  pt wearing L PRAFO for foot positioning.  Sit>< stand from raised bed with min assist.  Squat pivot to R with limited wt bearing LLE, min/mod asssit. neuromuscular re-education for L hip flexibilty in supine; for alternating reciprocal movements x 3 extremities on NutStep. L hand not used on NuStep.  Pt tolerated only very small 2" excursions for bilLE movement due to L foot pain. Attempted gait but pt unable to bear wt.  He is tender around 4 metatarsal, but no bruising noted. Seated dual task activity to reach across midline with R hand, L forearm wt bearing, and choosing small 1 animal figures out of 2 or 3 in answer to questions about type of animal it is, etc. Pt accurate 100% with extra time and occasional cues to look L. W/c propulsion x 50' using hemi method, c/o R quad fatigue.  Lem Peary 01/22/2016, 1:41 PM

## 2016-01-23 ENCOUNTER — Encounter (HOSPITAL_COMMUNITY): Payer: BLUE CROSS/BLUE SHIELD

## 2016-01-23 ENCOUNTER — Inpatient Hospital Stay (HOSPITAL_COMMUNITY): Payer: BLUE CROSS/BLUE SHIELD | Admitting: Occupational Therapy

## 2016-01-23 ENCOUNTER — Inpatient Hospital Stay (HOSPITAL_COMMUNITY): Payer: BLUE CROSS/BLUE SHIELD | Admitting: Speech Pathology

## 2016-01-23 ENCOUNTER — Inpatient Hospital Stay (HOSPITAL_COMMUNITY): Payer: BLUE CROSS/BLUE SHIELD | Admitting: Physical Therapy

## 2016-01-23 DIAGNOSIS — F4321 Adjustment disorder with depressed mood: Secondary | ICD-10-CM

## 2016-01-23 LAB — URINE CULTURE

## 2016-01-23 LAB — GLUCOSE, CAPILLARY
GLUCOSE-CAPILLARY: 180 mg/dL — AB (ref 65–99)
GLUCOSE-CAPILLARY: 162 mg/dL — AB (ref 65–99)
GLUCOSE-CAPILLARY: 153 mg/dL — AB (ref 65–99)
GLUCOSE-CAPILLARY: 167 mg/dL — AB (ref 65–99)

## 2016-01-23 MED ORDER — INSULIN GLARGINE 100 UNIT/ML ~~LOC~~ SOLN
30.0000 [IU] | Freq: Every day | SUBCUTANEOUS | Status: DC
  Administered 2016-01-23 – 2016-01-25 (×3): 30 [IU] via SUBCUTANEOUS
  Filled 2016-01-23 (×4): qty 0.3

## 2016-01-23 NOTE — Progress Notes (Signed)
Speech Language Pathology Daily Session Note  Patient Details  Name: Janey GentaJarvis George Hartlage MRN: [id number] Date of Birth: 09/10/1960  Today's Date: 01/23/2016 SLP Individual Time: 1000-1100 SLP Individual Time Calculation (min): 60 min   Short Term Goals: Week 4: SLP Short Term Goal 1 (Week 4): Patient will consume regular textures and thin liquids without overt s/s of aspiration with Mod I for use of swallowing compensatory strategies.  SLP Short Term Goal 2 (Week 4): Patient will maintain eye contact at midline to conversation partner during functional conversation/task for >1 minute in >50% of opportunities with Mod A verbal and question cues.  SLP Short Term Goal 3 (Week 4): Patient will perform visual scanning tasks to locate items at midline with min A multimodal cues in 75% of opportunities.  SLP Short Term Goal 4 (Week 4): Patient will demonstrate basic problem solving in functional tasks with min A verbal and question cues.  Skilled Therapeutic Interventions:Skilled treatment session focused on dysphagia and cognition goals. SLP facilitated session by providing Mod I when pt consuming thin liquids. Pt able to recall swallow compensatory strategies with Mod I. Pt able to recall recent progress with supervision level during intake. Pt required Max A verbal and visual cues to maintain eye contact at midline to conversation partner for > 1 minute in 50%. Pt required Max A faded to Mod A verbal cues to complete visual scanning tasks at midline. Pt was left upright in his wheelchair. Nursing was advised that pt was in room with safety belt donned. All needs were within reach. Continue current plan of care.   Function:  Eating Eating   Modified Consistency Diet: No Eating Assist Level: Set up assist for;Swallowing techniques: self managed   Eating Set Up Assist For: Opening containers Helper Scoops Food on Utensil: Occasionally     Cognition Comprehension Comprehension assist level:  Follows basic conversation/direction with no assist  Expression   Expression assist level: Expresses basic needs/ideas: With no assist  Social Interaction Social Interaction assist level: Interacts appropriately 75 - 89% of the time - Needs redirection for appropriate language or to initiate interaction.  Problem Solving Problem solving assist level: Solves basic 50 - 74% of the time/requires cueing 25 - 49% of the time  Memory Memory assist level: Recognizes or recalls 75 - 89% of the time/requires cueing 10 - 24% of the time    Pain Pain Assessment Pain Assessment: Faces Faces Pain Scale: Hurts a little bit Pain Type: Acute pain Pain Location: Foot Pain Orientation: Left Pain Descriptors / Indicators: Discomfort Pain Onset: On-going Pain Intervention(s): Repositioned  Therapy/Group: Individual Therapy  Ishika Chesterfield 01/23/2016, 12:03 PM

## 2016-01-23 NOTE — Progress Notes (Signed)
Social Work Patient ID: James Holden, male   DOB: 04/15/1960, 55 y.o.   MRN: [id number]  Spoke with wife to discuss team conference goals and need for family education. She plans to come in Sat and Sun and this worker Encouraged to come in Monday also with regular therapists. Son is also coming with her. Discussed equipment and she wanted worker to contact her CM through her [employer] and discuss what is needed. Wife is aware pt will need 24 hr care At home.

## 2016-01-23 NOTE — Patient Care Conference (Signed)
Inpatient RehabilitationTeam Conference and Plan of Care Update Date: 01/22/2016   Time: 10:50 AM    Patient Name: James Holden      Medical Record Number: [id number]  Date of Birth: 12/15/1960 Sex: Male         Room/Bed: 4W26C/4W26C-01 Payor Info: Payor: BLUE CROSS BLUE SHIELD / Plan: BCBS OTHER / Product Type: *No Product type* /    Admitting Diagnosis: CVA With Craniotomy  Admit Date/Time:  12/27/2015  2:13 PM Admission Comments: No comment available   Primary Diagnosis:  <principal problem not specified> Principal Problem: <principal problem not specified>  Patient Active Problem List   Diagnosis Date Noted  . Adjustment disorder with depressed mood   . Hyponatremia   . Hallucinations   . Diabetes mellitus type 2 in nonobese (HCC)   . Benign essential HTN   . Recurrent major depressive disorder (HCC)   . 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 Claudette LawsAndrew Kirsteins Social Worker Present: Dossie DerBecky Marit Goodwill, LCSW Nurse Present: Carmie EndAngie Joyce, RN PT Present: Wanda Plumparoline Cook, PT;Rodney Leo GrosserWishart, Antonietta JewelPT;Austin Tucker, PT OT Present: Perrin MalteseJames McGuire, OT SLP Present: Jackalyn LombardNicole Page, SLP PPS Coordinator present : Tora DuckMarie Noel, RN, CRRN     Current Status/Progress Goal Weekly Team Focus  Medical   Swallowing improved, PEG removed  Maintain adequate nutrition without PEG  Caregiver training for discharge   Bowel/Bladder   Continent of bowel and bladder with timed toileting q 3 hours.  Continue timed toileting at HS- to promote continence  Educate patient to follow schedule implemented by staff to assist with continence.  Continue timed toileting q 3 hours   Swallow/Nutrition/ Hydration   Tolerating regular, textures; full supervision   supervision   upgrade to intermittent supervision during meals   ADL's   min assist for UB bathing, mod assist for LB bathing sit  to stand, Min assist for UB dressing with mod assist for LB dressing sit to stand.  Left neglect improving with pt now scanning across midline to the left with slightly less cueing.  Brunnstrum stage I-II in the LUE at this time with slight flexor tone noted in the digits.  Still with pain at times in the hand with PROM as well as the shoulder.  Min to mod assist for squat pivot transfers to the toilet.  min assist overall  selfcare retraining, balance retraining, functional transfers, neuromuscular re-education, visual compensation, pt/family education   Mobility   Min-mod assist bed mobility with heavy use of rails. Supervisoin sitting balance. Min assist squat pivot/SB transfers. intermittent mod-max assist gait with HW up to 1050ft. supervision WC propulsion.   Min-mod assist with LRD for Bed mobility and transfers. Supervision with WC mobility.   improved independence with Transfer, awareness of L side, gait trianing, family educaiton for safe d/c.    Communication             Safety/Cognition/ Behavioral Observations  was at min assist level towards the end of last week, now with slight decline in function due to flatter affect and increased reports of depression neuropsych getting involved   min assist   continue to address inattention, sustained attention, awareness   Pain   (L) shoulder pain-kineso tape in place, (L) LE with spasms-Baclofen 5mg  scheduled. PRN Oxycodone and Tylenol  <3  Continue to assess pain q shilft and PRN- medicate as needed, and scheduled medication in use.   Skin  PEG tube removed-dry gauze in place. MASD buttocks epbc in use  Keep skin free of breakdown/infection with minimal assistance.  Continue to assess skin q shift and PRN, and encourage q 2 turning while in bed.      *See Care Plan and progress notes for long and short-term goals.  Barriers to Discharge: Severe left neglect, severe weakness    Possible Resolutions to Barriers:  May need equipment to help  with mobility, train caregiver    Discharge Planning/Teaching Needs:  Family planning to come in for education end of week and beginning of next week to prepare for DC home.      Team Discussion:  Progressing toward his goals of min-mod assist. Need family to come in for education to learn his care. Pain in hand and leg from tone-MD working on medications for this. Regular diet thin liquids. Neuro-psych to see feeling down. PEG tube DC today.  Revisions to Treatment Plan:  DC 12/12   Continued Need for Acute Rehabilitation Level of Care: The patient requires daily medical management by a physician with specialized training in physical medicine and rehabilitation for the following conditions: Daily direction of a multidisciplinary physical rehabilitation program to ensure safe treatment while eliciting the highest outcome that is of practical value to the patient.: Yes Daily medical management of patient stability for increased activity during participation in an intensive rehabilitation regime.: Yes Daily analysis of laboratory values and/or radiology reports with any subsequent need for medication adjustment of medical intervention for : Neurological problems;Post surgical problems  [name], Lemar LivingsRebecca G 01/23/2016, 3:51 PM

## 2016-01-23 NOTE — Progress Notes (Signed)
Physical Therapy Session Note  Patient Details  Name: Janey GentaJarvis George Kitner MRN: [id number] Date of Birth: 04/08/1960  Today's Date: 01/23/2016 PT Individual Time: 1301-1415 PT Individual Time Calculation (min): 74 min    Short Term Goals: Week 4:  PT Short Term Goal 1 (Week 4): LTG = STG due to ELOS  Skilled Therapeutic Interventions/Progress Updates:     Patient received sitting in WC and agreeable to PT.   WC mobility. 17020ft and 1750ft With supervision assist. PT was required to provide constant cues for awareness of L side to prevent hitting obstacles as well as proper use of R LE to steer WC once stuck on doorframe.   Transfer training to and from mat table with min assist for squat pivot transfer to the R. PT only required to cue patient for LLE positioning to allow increased WB through LLE and improve ease of transfer.   Supine NMR. SLR, SAQ, Bridge, isometric hip adduction, Clam shell against manual resistance, LLE flexion/extension.. All completed x 10 with min assist from PT for proper form and improved activation.   Sit<>supine x 4 each with supervision assist and heavy cues for sequencing using the  RLE to control the LLE when coming to supine and proper use of UE to pull from edge of mat. .  Sit<>stand x 4 with min assist and HW. Weight shifting R and L x 1 minutes with min assist from PT.   Manual therapy for multiple trigger point release in the Quadriceps and TFL. X 10 minutes patient reports significant decrease in pain in the R thigh and improved ease of WC mobility.   [facility] to [facility] and [facility] reach.       Therapy Documentation Precautions:  Precautions Precautions: Fall Precaution Comments: L hemiplegia, R hemicraniotomy  Required Braces or Orthoses: Other Brace/Splint Other Brace/Splint: Helmet when OOB, PRAFO on left foot Restrictions Weight Bearing Restrictions: No Vital Signs: Therapy Vitals Temp: 98.4 F (36.9  C) Temp Source: Oral Pulse Rate: (!) 46 Resp: 18 BP: 138/62 Patient Position (if appropriate): Sitting Pain:   2/10 R thigh. Aching.   See Function Navigator for Current Functional Status.   Therapy/Group: Individual Therapy  Golden Popustin E Nahome Bublitz 01/23/2016, 6:42 PM

## 2016-01-23 NOTE — Progress Notes (Signed)
Subjective/Complaints: Per therapy has had increasing LLE spasticity Some decreased motivation, discussed with Neuropsych  ROS: pt denies nausea, vomiting, diarrhea, cough, shortness of breath or chest pain   Objective: Vital Signs: Blood pressure 135/65, pulse (!) 43, temperature 98.3 F (36.8 C), temperature source Oral, resp. rate 17, height 5\' 10"  (1.778 m), weight 89.1 kg (196 lb 8 oz), SpO2 97 %. No results found. Results for orders placed or performed during the hospital encounter of 12/27/15 (from the past 72 hour(s))  Glucose, capillary     Status: Abnormal   Collection Time: 01/20/16 11:37 AM  Result Value Ref Range   Glucose-Capillary 135 (H) 65 - 99 mg/dL  Glucose, capillary     Status: Abnormal   Collection Time: 01/20/16  4:25 PM  Result Value Ref Range   Glucose-Capillary 161 (H) 65 - 99 mg/dL  Glucose, capillary     Status: Abnormal   Collection Time: 01/20/16  8:39 PM  Result Value Ref Range   Glucose-Capillary 198 (H) 65 - 99 mg/dL  Glucose, capillary     Status: Abnormal   Collection Time: 01/21/16  6:37 AM  Result Value Ref Range   Glucose-Capillary 133 (H) 65 - 99 mg/dL  Glucose, capillary     Status: Abnormal   Collection Time: 01/21/16 11:47 AM  Result Value Ref Range   Glucose-Capillary 195 (H) 65 - 99 mg/dL   Comment 1 Notify RN   Glucose, capillary     Status: Abnormal   Collection Time: 01/21/16  5:02 PM  Result Value Ref Range   Glucose-Capillary 111 (H) 65 - 99 mg/dL   Comment 1 Notify RN   Glucose, capillary     Status: Abnormal   Collection Time: 01/21/16  8:46 PM  Result Value Ref Range   Glucose-Capillary 143 (H) 65 - 99 mg/dL  Urinalysis, Routine w reflex microscopic     Status: None   Collection Time: 01/22/16  6:41 AM  Result Value Ref Range   Color, Urine YELLOW YELLOW   APPearance CLEAR CLEAR   Specific Gravity, Urine 1.015 1.005 - 1.030   pH 5.0 5.0 - 8.0   Glucose, UA NEGATIVE NEGATIVE mg/dL   Hgb urine dipstick NEGATIVE  NEGATIVE   Bilirubin Urine NEGATIVE NEGATIVE   Ketones, ur NEGATIVE NEGATIVE mg/dL   Protein, ur NEGATIVE NEGATIVE mg/dL   Nitrite NEGATIVE NEGATIVE   Leukocytes, UA NEGATIVE NEGATIVE  Glucose, capillary     Status: Abnormal   Collection Time: 01/22/16  6:47 AM  Result Value Ref Range   Glucose-Capillary 145 (H) 65 - 99 mg/dL  Glucose, capillary     Status: Abnormal   Collection Time: 01/22/16 11:06 AM  Result Value Ref Range   Glucose-Capillary 163 (H) 65 - 99 mg/dL   Comment 1 Notify RN   Glucose, capillary     Status: Abnormal   Collection Time: 01/22/16  5:02 PM  Result Value Ref Range   Glucose-Capillary 183 (H) 65 - 99 mg/dL   Comment 1 Notify RN   Glucose, capillary     Status: Abnormal   Collection Time: 01/22/16  8:30 PM  Result Value Ref Range   Glucose-Capillary 128 (H) 65 - 99 mg/dL  Glucose, capillary     Status: Abnormal   Collection Time: 01/23/16  7:05 AM  Result Value Ref Range   Glucose-Capillary 180 (H) 65 - 99 mg/dL     Physical Exam: General: NAD ENT- former trach site CDI,  Psych: Mood and affect are  appropriate Head: craniectomy site without swelling noted Heart: RRR no jvd Lungs: clear with reg effort Abdomen: +BS, soft.former PEG site with bandage Musc: no edema. No tenderness. Skin: skin intact. Neurologic: left inattention and field cut Motor strength is 5/5 in Right  deltoid, bicep, tricep, grip, hip flexor, knee extensors, ankle dorsiflexor and plantar flexor 2-/5 Left hip flexion  LUE: 0/5 prox to distal.  Sensation diminished to light touch LUE  Assessment/Plan: 1. Functional deficits secondary to RIght MCA infarct with left hemiplegia which require 3+ hours per day of interdisciplinary therapy in a comprehensive inpatient rehab setting. Physiatrist is providing close team supervision and 24 hour management of active medical problems listed below. Physiatrist and rehab team continue to assess barriers to discharge/monitor patient  progress toward functional and medical goals. FIM: Function - Bathing Position: Wheelchair/chair at sink Body parts bathed by patient: Left arm, Chest, Abdomen, Front perineal area, Right upper leg, Left upper leg Body parts bathed by helper: Right lower leg, Left lower leg, Back, Right arm, Buttocks Bathing not applicable: Front perineal area, Buttocks (did not attempt this session) Assist Level: Touching or steadying assistance(Pt > 75%)  Function- Upper Body Dressing/Undressing What is the patient wearing?: Pull over shirt/dress Pull over shirt/dress - Perfomed by patient: Thread/unthread right sleeve, Put head through opening Pull over shirt/dress - Perfomed by helper: Thread/unthread left sleeve, Pull shirt over trunk Assist Level: Touching or steadying assistance(Pt > 75%) Function - Lower Body Dressing/Undressing What is the patient wearing?: Pants, Faythe Dingwalled Hose, Shoes Position: Research officer, trade unionWheelchair/chair at Harrah's Entertainmentsink Pants- Performed by patient: Thread/unthread right pants leg Pants- Performed by helper: Thread/unthread right pants leg, Thread/unthread left pants leg, Pull pants up/down Non-skid slipper socks- Performed by helper: Don/doff right sock Socks - Performed by patient: Don/doff right sock, Don/doff left sock Socks - Performed by helper: Don/doff right sock, Don/doff left sock Shoes - Performed by patient: Don/doff right shoe (shoe donned already tied) Shoes - Performed by helper: Don/doff left shoe, Fasten left TED Hose - Performed by patient: Don/doff right TED hose, Don/doff left TED hose TED Hose - Performed by helper: Don/doff right TED hose, Don/doff left TED hose Assist for footwear: Partial/moderate assist Assist for lower body dressing: Touching or steadying assistance (Pt > 75%)  Function - Toileting Toileting activity did not occur: Safety/medical concerns Toileting steps completed by helper: Adjust clothing prior to toileting, Performs perineal hygiene, Adjust clothing after  toileting Toileting Assistive Devices: Other (comment) (lift) Assist level: Two helpers  Function - ArchivistToilet Transfers Toilet transfer activity did not occur: Safety/medical concerns Toilet transfer assistive device: Systems developerMechanical lift Mechanical lift: Huntley DecSara Assist level to toilet: 2 helpers Assist level from toilet: 2 helpers  Function - Chair/bed transfer Chair/bed transfer method: Squat pivot Chair/bed transfer assist level: Moderate assist (Pt 50 - 74%/lift or lower) Chair/bed transfer assistive device: Armrests Mechanical lift: Stedy Chair/bed transfer details: Verbal cues for technique, Tactile cues for weight shifting, Tactile cues for sequencing, Verbal cues for sequencing, Manual facilitation for weight shifting  Function - Locomotion: Wheelchair Will patient use wheelchair at discharge?: Yes Type: Manual Max wheelchair distance: 14150ft  Assist Level: Supervision or verbal cues Assist Level: Supervision or verbal cues Wheel 150 feet activity did not occur: Safety/medical concerns Assist Level: Supervision or verbal cues Turns around,maneuvers to table,bed, and toilet,negotiates 3% grade,maneuvers on rugs and over doorsills: No Function - Locomotion: Ambulation Ambulation activity did not occur: Safety/medical concerns Assistive device: Parallel bars, Orthosis Max distance: 3110ft  Assist level: 2 helpers Walk 10 feet  activity did not occur: Safety/medical concerns Assist level: Moderate assist (Pt 50 - 74%) Walk 50 feet with 2 turns activity did not occur: Safety/medical concerns Walk 150 feet activity did not occur: Safety/medical concerns Walk 10 feet on uneven surfaces activity did not occur: Safety/medical concerns  Function - Comprehension Comprehension: Auditory Comprehension assist level: Follows basic conversation/direction with no assist  Function - Expression Expression: Verbal Expression assist level: Expresses basic needs/ideas: With no assist  Function -  Social Interaction Social Interaction assist level: Interacts appropriately 75 - 89% of the time - Needs redirection for appropriate language or to initiate interaction.  Function - Problem Solving Problem solving assist level: Solves basic 50 - 74% of the time/requires cueing 25 - 49% of the time  Function - Memory Memory assist level: Recognizes or recalls 75 - 89% of the time/requires cueing 10 - 24% of the time Patient normally able to recall (first 3 days only): Current season, That he or she is in a hospital  Medical Problem List and Plan: 1.  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  .             -continue PT, OT, SLP  -ELOS 01/28/16, 2.  DVT Prophylaxis/Anticoagulation: Subcutaneous heparin for DVT prophylaxis 3. Pain Management/chronic back pain: Oxycodone 5 mg every 6 hours as needed  -continue baclofen prn for cramps/spasms.    -Left hand and wrist pain improved with splint        4. Mood: Prozac 40 mg daily.  Abilify 5 mg daily 5. Neuropsych: This patient is capable of making decisions on his own behalf. ritalin 10mg  BID, pswych evalk for potential depression which may be related to increasing awareness of deficits 6. Skin/Wound Care: Routine skin checks 7. Fluids/Electrolytes/Nutrition: appreciate dietary note 8.H/O  Tracheostomy Decannulated 12/25/2015--healed 9. Gastrostomy PEG tube 12/05/2015. Nocturnal tube feeds recently discontinued. Currently on dysphagia #3/thin.              -PEG d/ced with traction removal minimal bleeding. 4x4 dressing applied, 10. Hypertension.     -continue amlodipine 5 mg daily, lisinopril 30 mg daily with hold parameters Some fluctuations/lability Vitals:   01/23/16 0548 01/23/16 0758  BP: (!) 145/49 135/65  Pulse: (!) 43   Resp: 17   Temp: 98.3 F (36.8 C)    11. Diabetes mellitus.  Intake is good  Check blood sugars before meals and at bedtime  Fair control at present.  increase  lantus  to 30 units for tighter control  Recent Labs  01/22/16 1702 01/22/16 2030 01/23/16 0705  GLUCAP 183* 128* 180*   12. Hyperlipidemia. Lipitor 80 mg daily at bedtime 13.Tobacco abuse. Counseling as appropriate 14.  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 ?RLS- pt c/o symptoms BLE prior to CVA   -low dose requip remains effective, will add trazodone for sleep    LOS (Days) 27 A FACE TO FACE EVALUATION WAS PERFORMED  KIRSTEINS,ANDREW E 01/23/2016, 8:05 AM

## 2016-01-23 NOTE — Progress Notes (Signed)
Social Work Patient ID: James Holden, male   DOB: 03/27/1960, 55 y.o.   MRN: [id number]  Have left two messages for pt's wife to contact me regarding setting up family education for pt going home next Tuesday. Will await her return call.

## 2016-01-23 NOTE — Progress Notes (Signed)
Occupational Therapy Session Note  Patient Details  Name: James GentaJarvis George Bhat MRN: [id number] Date of Birth: 06/21/1960  Today's Date: 01/23/2016 OT Individual Time: 0900-1000 OT Individual Time Calculation (min): 60 min     Short Term Goals: Week 4:  OT Short Term Goal 1 (Week 4): STGs = LTGs  Skilled Therapeutic Interventions/Progress Updates:    Pt completed bathing and dressing sit to stand at the sink during session.  Initially when therapist entered he was working on self feeding and having trouble locating his boiled eggs.  Therapist provided mod instructional cueing for him to scan left of midline to locate them, which he was able to do.  Once breakfast was finished he transitioned to the EOB on the left side with mod assist and max demonstrational cueing and then to the wheelchair with mod assist squat pivot to the right.  Mod demonstrational cueing for sequencing through bathing as well as for scanning left of midline to locate towel and soap when needed.  Max hand over hand assistance for use of the LUE as an active assist to wash the right arm.  With sit to stand during LB selfcare mod assist is needed with wearing of PRAFO on the left foot as well as he could not tolerate weightbearing on bare foot, and inversion tone still present.  Therapist re-applied kinesiotape to the left shoulder at end of session.  Pt left in wheelchair with SLP present for next therapy session.  Therapy Documentation Precautions:  Precautions Precautions: Fall Precaution Comments: L hemiplegia, R hemicraniotomy  Required Braces or Orthoses: Other Brace/Splint Other Brace/Splint: Helmet when OOB, PRAFO on left foot Restrictions Weight Bearing Restrictions: No  Pain: Pain Assessment Pain Assessment: Faces Faces Pain Scale: Hurts a little bit Pain Type: Acute pain Pain Location: Foot Pain Orientation: Left Pain Descriptors / Indicators: Discomfort Pain Onset: On-going Pain Intervention(s):  Repositioned ADL:  See Function Navigator for Current Functional Status.   Therapy/Group: Individual Therapy OTR/L  Bridger Pizzi 01/23/2016, 11:01 AM

## 2016-01-23 NOTE — Consult Note (Signed)
PSYCHODIAGNOSTIC EVALUATION - CONFIDENTIAL [facility]   James Holden is a 55 year old man, who was seen for an initial psychodiagnostic evaluation post CVA with multiple complications including recurrent aspiration pneumonia and cerebral edema requiring hemicraniotomy to assess for potential depression, anxiety, or other mental illness.    During the session, James Holden reported that he has been "pretty good" overall.  He acknowledged aggravation with slowed thinking; he commented that often people ask him a question and while he is still processing his response, they ask a follow-up question.  He also noted frustration over the disconnect between his brain telling his limbs to move and his limbs actually moving.  He stated that he has felt mild depression off and on throughout his hospitalization owing to the slow speed of physical recovery.  His low moods typically last between 1-2 days and resolve when he is able to remind himself about the progress he has made.  He also said that he is motivated for recovery because he wants to get as well as possible in order to make it easier on his family after discharge.  He noted that there are some days when he "just [doesn't] want to be messed with" or have visitors; he described himself as "stubborn."  For example, he said that today, he was feeling comfortable in his bed, which is reportedly a rarity so he was not looking forward to being asked to get up and participate in therapies today.  James Holden said that he feels judged by staff members as "lazy" when he does not want to get up, but he feels as though there are valid reasons when he does not want to participate and he wishes he could be told that it is okay to take a day off.  James Holden noted strong appetite and stated that since his sleeping pill was re-initiated, he is sleeping well.    IMPRESSION:  James Holden reported symptoms consistent with an adjustment disorder with depressed  mood.  He likely does not require medication treatment for mood symptom management at this time.  During today's session, psychoeducation was provided regarding the importance of participating in therapy everyday and why it is unrealistic for him to be given days off when he is not up to therapy.  However, significant time was spent helping implement cognitive therapy strategies to improve participation in therapy.  First, we worked on re-framing pain associated with therapy as a "good pain" akin to that he may experience the day after lengthy yard work.  We also discussed how his stubbornness can get in his way sometimes.  He felt as though it might be helpful for staff members to remind him when he is unmotivated how well he is doing, how far he has come, and about his goals for discharge.  Staff members may also find it helpful to repeat concerns that he expresses aloud so he knows he has been heard, even if we cannot do what he is requesting.  From a cognitive standpoint, given his report of slowed processing speed, it may be helpful at some point in the future (6-8 months) for him to undergo a comprehensive neuropsychological evaluation.  Contact information for a provider in his area could be included in his discharge paperwork for that purpose.  James Holden reported feeling relieved after speaking with the neuropsychologist because he "got things off his chest."  Additional neuropsychological follow-up could be requested prior to discharge should the treatment team feel that it would  be beneficial in informing care.    DIAGNOSIS:   Adjustment disorder with depressed mood  Leavy CellaKaren Jadiel Schmieder, Psy.D.  Clinical Neuropsychologist

## 2016-01-24 ENCOUNTER — Inpatient Hospital Stay (HOSPITAL_COMMUNITY): Payer: BLUE CROSS/BLUE SHIELD | Admitting: Speech Pathology

## 2016-01-24 ENCOUNTER — Inpatient Hospital Stay (HOSPITAL_COMMUNITY): Payer: BLUE CROSS/BLUE SHIELD | Admitting: Occupational Therapy

## 2016-01-24 ENCOUNTER — Inpatient Hospital Stay (HOSPITAL_COMMUNITY): Payer: BLUE CROSS/BLUE SHIELD | Admitting: Physical Therapy

## 2016-01-24 LAB — GLUCOSE, CAPILLARY
GLUCOSE-CAPILLARY: 208 mg/dL — AB (ref 65–99)
Glucose-Capillary: 138 mg/dL — ABNORMAL HIGH (ref 65–99)
Glucose-Capillary: 172 mg/dL — ABNORMAL HIGH (ref 65–99)
Glucose-Capillary: 185 mg/dL — ABNORMAL HIGH (ref 65–99)

## 2016-01-24 NOTE — Progress Notes (Signed)
Orthopedic Tech Progress Note Patient Details:  James Holden 01/21/1961 [id number]  Ortho Devices Type of Ortho Device: Ankle Air splint   James Holden 01/24/2016, 12:56 PM

## 2016-01-24 NOTE — Progress Notes (Signed)
Speech Language Pathology Weekly Progress and Session Note  Patient Details  Name: Geovannie Vilar MRN: [id number] Date of Birth: 15-Oct-1960  Beginning of progress report period: January 17, 2016 End of progress report period: January 24, 2016  Today's Date: 01/24/2016 SLP Individual Time: 1330-1400 SLP Individual Time Calculation (min): 30 min   Short Term Goals: Week 4: SLP Short Term Goal 1 (Week 4): Patient will consume regular textures and thin liquids without overt s/s of aspiration with Mod I for use of swallowing compensatory strategies.  SLP Short Term Goal 1 - Progress (Week 4): Progressing toward goal SLP Short Term Goal 2 (Week 4): Patient will maintain eye contact at midline to conversation partner during functional conversation/task for >1 minute in >50% of opportunities with Mod A verbal and question cues.  SLP Short Term Goal 2 - Progress (Week 4): Progressing toward goal SLP Short Term Goal 3 (Week 4): Patient will perform visual scanning tasks to locate items at midline with min A multimodal cues in 75% of opportunities.  SLP Short Term Goal 3 - Progress (Week 4): Not met SLP Short Term Goal 4 (Week 4): Patient will demonstrate basic problem solving in functional tasks with min A verbal and question cues. SLP Short Term Goal 4 - Progress (Week 4): Not met    New Short Term Goals: Week 5: SLP Short Term Goal 1 (Week 5): Patient will consume regular textures and thin liquids without overt s/s of aspiration with Mod I for use of swallowing compensatory strategies.  SLP Short Term Goal 2 (Week 5): Patient will maintain eye contact at midline to conversation partner during functional conversation/task for >1 minute in >50% of opportunities with Mod A verbal and question cues.  SLP Short Term Goal 3 (Week 5): Patient will perform visual scanning tasks to locate items at midline with Mod A multimodal cues in 75% of opportunities.  SLP Short Term Goal 4 (Week 5): Patient will  demonstrate basic problem solving in functional tasks with Mod A verbal and question cues.  Weekly Progress Updates:Patient is making functional progress towards 2 of 4 STG's this reporting period due to improved swallowing and cognitive function. Currently, pt is intermittent supervision following tray set up for use of compensatory strategies with regular diet and thin liquids.  Pt continues to require Max A faded to Mod A verbal cues for visual scanning tasks, maintain eye contact at midline and demonstrating basic problem solving in functional tasks. Skilled ST services are required to further advanced pt's ability to function more independently within environment and reduce caregiver burden at time of discharge.    Intensity: Minumum of 1-2 x/day, 30 to 90 minutes Frequency: 3 to 5 out of 7 days Duration/Length of Stay: 12/12 Treatment/Interventions: Cognitive remediation/compensation;Cueing hierarchy;Functional tasks;Patient/family education;Therapeutic Activities;Internal/external aids;Dysphagia/aspiration precaution training;Environmental controls;Speech/Language facilitation   Daily Session  Skilled Therapeutic Interventions: Skilled treatment focused on cognition goals. SLP facilitated session by providing Mod I during consumption of regular textures and thin liquids for use of swallow compensatory strategies as appropriate. Pt without overt s/s of aspiration and appropriate use of swallow strategies. Pt required Mod I verbal cues for eye contact at midline for > 1 minute and Mod A verbal and tactile cues for visual scanning tasks to locate items at midline. Pt was intelligible at the simple conversation level with staff and wife (over the phone). Wife states that she will be present during therapy session on 12/9 thru 12/11. STG and LTGs were updated to reflect progress. Pt  left up in wheelchair with safety belt donned and all needs within reach. Nursinga ware of pt in room without therapist.  Nursing stated that they will provide supervision as appropriate.        Function:   Eating Eating   Modified Consistency Diet: No Eating Assist Level: Set up assist for;Swallowing techniques: self managed   Eating Set Up Assist For: Opening containers       Cognition Comprehension Comprehension assist level: Follows basic conversation/direction with no assist  Expression   Expression assist level: Expresses basic needs/ideas: With no assist  Social Interaction Social Interaction assist level: Interacts appropriately 75 - 89% of the time - Needs redirection for appropriate language or to initiate interaction.  Problem Solving Problem solving assist level: Solves basic 50 - 74% of the time/requires cueing 25 - 49% of the time  Memory Memory assist level: Recognizes or recalls 75 - 89% of the time/requires cueing 10 - 24% of the time   General    Pain Pain Assessment Pain Assessment: Faces Faces Pain Scale: Hurts a little bit Pain Type: Acute pain Pain Location: Foot Pain Orientation: Left Pain Intervention(s): Repositioned Multiple Pain Sites: No  Therapy/Group: Individual Therapy  Lorie Cleckley 01/24/2016, 3:01 PM

## 2016-01-24 NOTE — Progress Notes (Signed)
Occupational Therapy Session Note  Patient Details  Name: James Holden MRN: [id number] Date of Birth: 05/29/1960  Today's Date: 01/24/2016 OT Individual Time: 1004-1103 OT Individual Time Calculation (min): 59 min     Short Term Goals: Week 4:  OT Short Term Goal 1 (Week 4): STGs = LTGs  Skilled Therapeutic Interventions/Progress Updates:    Pt completed shower and dressing during session.  Mod instructional cueing for scanning left of midline for objects throughout session.  Max hand over hand assistance to integrate the LUE into bathing of the RUE and some of the left lower leg.  Steady used for transfer from bed to shower and from shower to wheelchair secondary to time constraints.  He was able to complete UB bathing with min assist as well as dressing with mod assist.  Still exhibits visual processing difficulties to determine parts of the shirt to begin with donning the left side first.  He also exhibits this same difficulty when it comes to donning the brief and pants, requiring max assist to complete.  Pt left in wheelchair with safety belt in place as well as soft shell helmet.  Call button and phone placed on the right side.  Pt still with noted slight decreased initiation and decreased mood compared to last week.   Therapy Documentation Precautions:  Precautions Precautions: Fall Precaution Comments: L hemiplegia, R hemicraniotomy  Required Braces or Orthoses: Other Brace/Splint Other Brace/Splint: Helmet when OOB, PRAFO on left foot Restrictions Weight Bearing Restrictions: No  Pain: Pain Assessment Pain Assessment: Faces Faces Pain Scale: Hurts a little bit Pain Type: Acute pain Pain Location: Foot Pain Orientation: Left Pain Intervention(s): Repositioned Multiple Pain Sites: No ADL: See Function Navigator for Current Functional Status.   Therapy/Group: Individual Therapy  Selwyn Reason OTR/L 01/24/2016, 12:39 PM

## 2016-01-24 NOTE — Progress Notes (Signed)
Subjective/Complaints: No issues overnite, no abd pain  ROS: pt denies nausea, vomiting, diarrhea, cough, shortness of breath or chest pain   Objective: Vital Signs: Blood pressure 135/65, pulse (!) 48, temperature 98.3 F (36.8 C), temperature source Oral, resp. rate 20, height 5\' 10"  (1.778 m), weight 89.8 kg (197 lb 15.6 oz), SpO2 100 %. No results found. Results for orders placed or performed during the hospital encounter of 12/27/15 (from the past 72 hour(s))  Glucose, capillary     Status: Abnormal   Collection Time: 01/21/16 11:47 AM  Result Value Ref Range   Glucose-Capillary 195 (H) 65 - 99 mg/dL   Comment 1 Notify RN   Glucose, capillary     Status: Abnormal   Collection Time: 01/21/16  5:02 PM  Result Value Ref Range   Glucose-Capillary 111 (H) 65 - 99 mg/dL   Comment 1 Notify RN   Glucose, capillary     Status: Abnormal   Collection Time: 01/21/16  8:46 PM  Result Value Ref Range   Glucose-Capillary 143 (H) 65 - 99 mg/dL  Urine culture     Status: Abnormal   Collection Time: 01/22/16  6:41 AM  Result Value Ref Range   Specimen Description URINE, CLEAN CATCH    Special Requests NONE    Culture MULTIPLE SPECIES PRESENT, SUGGEST RECOLLECTION (A)    Report Status 01/23/2016 FINAL   Urinalysis, Routine w reflex microscopic     Status: None   Collection Time: 01/22/16  6:41 AM  Result Value Ref Range   Color, Urine YELLOW YELLOW   APPearance CLEAR CLEAR   Specific Gravity, Urine 1.015 1.005 - 1.030   pH 5.0 5.0 - 8.0   Glucose, UA NEGATIVE NEGATIVE mg/dL   Hgb urine dipstick NEGATIVE NEGATIVE   Bilirubin Urine NEGATIVE NEGATIVE   Ketones, ur NEGATIVE NEGATIVE mg/dL   Protein, ur NEGATIVE NEGATIVE mg/dL   Nitrite NEGATIVE NEGATIVE   Leukocytes, UA NEGATIVE NEGATIVE  Glucose, capillary     Status: Abnormal   Collection Time: 01/22/16  6:47 AM  Result Value Ref Range   Glucose-Capillary 145 (H) 65 - 99 mg/dL  Glucose, capillary     Status: Abnormal    Collection Time: 01/22/16 11:06 AM  Result Value Ref Range   Glucose-Capillary 163 (H) 65 - 99 mg/dL   Comment 1 Notify RN   Glucose, capillary     Status: Abnormal   Collection Time: 01/22/16  5:02 PM  Result Value Ref Range   Glucose-Capillary 183 (H) 65 - 99 mg/dL   Comment 1 Notify RN   Glucose, capillary     Status: Abnormal   Collection Time: 01/22/16  8:30 PM  Result Value Ref Range   Glucose-Capillary 128 (H) 65 - 99 mg/dL  Glucose, capillary     Status: Abnormal   Collection Time: 01/23/16  7:05 AM  Result Value Ref Range   Glucose-Capillary 180 (H) 65 - 99 mg/dL  Glucose, capillary     Status: Abnormal   Collection Time: 01/23/16 12:02 PM  Result Value Ref Range   Glucose-Capillary 162 (H) 65 - 99 mg/dL  Glucose, capillary     Status: Abnormal   Collection Time: 01/23/16  4:30 PM  Result Value Ref Range   Glucose-Capillary 153 (H) 65 - 99 mg/dL  Glucose, capillary     Status: Abnormal   Collection Time: 01/23/16  8:51 PM  Result Value Ref Range   Glucose-Capillary 167 (H) 65 - 99 mg/dL  Glucose, capillary  Status: Abnormal   Collection Time: 01/24/16  6:56 AM  Result Value Ref Range   Glucose-Capillary 138 (H) 65 - 99 mg/dL     Physical Exam: General: NAD ENT- former trach site CDI,  Psych: Mood and affect are appropriate Head: craniectomy site without swelling noted Heart: RRR no jvd Lungs: clear with reg effort Abdomen: +BS, soft.former PEG site small amt dried blood , bandage changed Musc: no edema. No tenderness. Skin: skin intact. Neurologic: left inattention and field cut Motor strength is 5/5 in Right  deltoid, bicep, tricep, grip, hip flexor, knee extensors, ankle dorsiflexor and plantar flexor 2-/5 Left hip flexion  LUE: 0/5 prox to distal.  Sensation diminished to light touch LUE  Assessment/Plan: 1. Functional deficits secondary to RIght MCA infarct with left hemiplegia which require 3+ hours per day of interdisciplinary therapy in a  comprehensive inpatient rehab setting. Physiatrist is providing close team supervision and 24 hour management of active medical problems listed below. Physiatrist and rehab team continue to assess barriers to discharge/monitor patient progress toward functional and medical goals. FIM: Function - Bathing Position: Wheelchair/chair at sink Body parts bathed by patient: Left arm, Chest, Abdomen, Front perineal area, Right upper leg, Left upper leg Body parts bathed by helper: Right lower leg, Left lower leg, Back, Right arm, Buttocks Bathing not applicable: Front perineal area, Buttocks (did not attempt this session) Assist Level: Touching or steadying assistance(Pt > 75%)  Function- Upper Body Dressing/Undressing What is the patient wearing?: Pull over shirt/dress Pull over shirt/dress - Perfomed by patient: Thread/unthread right sleeve, Put head through opening Pull over shirt/dress - Perfomed by helper: Thread/unthread left sleeve, Pull shirt over trunk Assist Level: Touching or steadying assistance(Pt > 75%) Function - Lower Body Dressing/Undressing What is the patient wearing?: Pants, Faythe D[place], Shoes Position: Research officer, trade union at [employer]- Performed by patient: Thread/unthread right pants leg Pants- Performed by helper: Thread/unthread right pants leg, Thread/unthread left pants leg, Pull pants up/down Non-skid slipper socks- Performed by helper: Don/doff right sock Socks - Performed by patient: Don/doff right sock, Don/doff left sock Socks - Performed by helper: Don/doff right sock, Don/doff left sock Shoes - Performed by patient: Don/doff right shoe Shoes - Performed by helper: Don/doff left shoe TED Hose - Performed by patient: Don/doff right TED hose, Don/doff left TED hose TED Hose - Performed by helper: Don/doff right TED hose, Don/doff left TED hose Assist for footwear: Partial/moderate assist Assist for lower body dressing: Touching or steadying assistance (Pt >  75%)  Function - Toileting Toileting activity did not occur: Safety/medical concerns Toileting steps completed by helper: Adjust clothing prior to toileting, Performs perineal hygiene, Adjust clothing after toileting Toileting Assistive Devices: Other (comment) (lift) Assist level: Two helpers  Function - Archivist transfer activity did not occur: Safety/medical concerns Toilet transfer assistive device: Systems developer lift: Forensic scientist level to toilet: 2 helpers Assist level from toilet: 2 helpers  Function - Chair/bed transfer Chair/bed transfer method: Squat pivot Chair/bed transfer assist level: Touching or steadying assistance (Pt > 75%) Chair/bed transfer assistive device: Armrests Mechanical lift: Stedy Chair/bed transfer details: Verbal cues for technique, Tactile cues for weight shifting, Tactile cues for sequencing, Verbal cues for sequencing, Manual facilitation for weight shifting  Function - Locomotion: Wheelchair Will patient use wheelchair at discharge?: Yes Type: Manual Max wheelchair distance: 118ft  Assist Level: Supervision or verbal cues Assist Level: Supervision or verbal cues Wheel 150 feet activity did not occur: Safety/medical concerns Assist Level: Supervision or verbal cues  Turns around,maneuvers to table,bed, and toilet,negotiates 3% grade,maneuvers on rugs and over doorsills: No Function - Locomotion: Ambulation Ambulation activity did not occur: Safety/medical concerns Assistive device: Parallel bars, Orthosis Max distance: 3410ft  Assist level: 2 helpers Walk 10 feet activity did not occur: Safety/medical concerns Assist level: Moderate assist (Pt 50 - 74%) Walk 50 feet with 2 turns activity did not occur: Safety/medical concerns Walk 150 feet activity did not occur: Safety/medical concerns Walk 10 feet on uneven surfaces activity did not occur: Safety/medical concerns  Function - Comprehension Comprehension:  Auditory Comprehension assist level: Follows basic conversation/direction with no assist  Function - Expression Expression: Verbal Expression assist level: Expresses basic needs/ideas: With no assist  Function - Social Interaction Social Interaction assist level: Interacts appropriately 75 - 89% of the time - Needs redirection for appropriate language or to initiate interaction.  Function - Problem Solving Problem solving assist level: Solves basic 50 - 74% of the time/requires cueing 25 - 49% of the time  Function - Memory Memory assist level: Recognizes or recalls 75 - 89% of the time/requires cueing 10 - 24% of the time Patient normally able to recall (first 3 days only): Current season, That he or she is in a hospital  Medical Problem List and Plan: 1.  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  .             -continue PT, OT, SLP  -ELOS 01/28/16, 2.  DVT Prophylaxis/Anticoagulation: Subcutaneous heparin for DVT prophylaxis 3. Pain Management/chronic back pain: Oxycodone 5 mg every 6 hours as needed  -continue baclofen prn for cramps/spasms.    -Left hand and wrist pain improved with splint        4. Mood: Prozac 40 mg daily.  Abilify 5 mg daily 5. Neuropsych: This patient is capable of making decisions on his own behalf. ritalin 10mg  BID, appreciate Neuropsych eval - staff will need to limit amt of info presented to pt at one time      6. Skin/Wound Care: Routine skin checks 7. Fluids/Electrolytes/Nutrition: appreciate dietary note 8.H/O  Tracheostomy Decannulated 12/25/2015--healed 9. Gastrostomy PEG tube 12/05/2015. Nocturnal tube feeds recently discontinued. Currently on dysphagia #3/thin.              -PEG d/ced with traction removal minimal bleeding. 4x4 dressing applied, 10. Hypertension.     -continue amlodipine 5 mg daily, lisinopril 30 mg daily with hold parameters Some fluctuations/lability Vitals:   01/23/16 1448  01/24/16 0432  BP: 138/62 135/65  Pulse: (!) 46 (!) 48  Resp: 18 20  Temp: 98.4 F (36.9 C) 98.3 F (36.8 C)   11. Diabetes mellitus.  Intake is good  Check blood sugars before meals and at bedtime  Fair control at present.  increase lantus  to 30 units, am CBG ok  Recent Labs  01/23/16 1630 01/23/16 2051 01/24/16 0656  GLUCAP 153* 167* 138*   12. Hyperlipidemia. Lipitor 80 mg daily at bedtime 13.Tobacco abuse. Counseling as appropriate 14.  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 ?RLS- pt c/o symptoms BLE prior to CVA   -low dose requip remains effective, will add trazodone for sleep    LOS (Days) 28 A FACE TO FACE EVALUATION  WAS PERFORMED  Erick ColaceKIRSTEINS,Falecia Vannatter E 01/24/2016, 7:42 AM

## 2016-01-24 NOTE — Progress Notes (Addendum)
Social Work Patient ID: James Holden, male   DOB: 05/21/1960, 55 y.o.   MRN: [id number]  Spoke with wife who reports she will be here Sat, Sun and Monday for training and son to come in Sun and Monday. She is not sure Who will be with him while they are working. She plans to check with the insurance. Informed her [employer] does not cover custodial care and he would need a CNA or trained family member or friend. She does [facility] and plans on contacting them. She states: " We may have to wing it." Encouraged her to do so. Have ordered wheelchair, wide drop arm bedside commode and transfer board, am awaiting to see if they want a hospital bed. Continue to Work on discharge needs.

## 2016-01-24 NOTE — Progress Notes (Signed)
Physical Therapy Session Note  Patient Details  Name: James Holden MRN: [id number] Date of Birth: 04/06/1960  Today's Date: 01/24/2016 PT Individual Time: [id number] PT Individual Time Calculation (min): 28 min     Therapy Documentation Precautions:  Precautions Precautions: Fall Precaution Comments: L hemiplegia, R hemicraniotomy  Required Braces or Orthoses: Other Brace/Splint Other Brace/Splint: Helmet when OOB, PRAFO on left foot Restrictions Weight Bearing Restrictions: No Vital Signs: Therapy Vitals Temp: 97.7 F (36.5 C) Temp Source: Oral Pulse Rate: 87 Resp: 18 BP: 112/62 Patient Position (if appropriate): Sitting Oxygen Therapy SpO2: 100 % O2 Device: Not Delivered Pain: Pain Assessment Pain Assessment: Faces Faces Pain Scale: Hurts a little bit Pain Type: Acute pain Pain Location: Foot Pain Orientation: Left Pain Intervention(s): Repositioned Multiple Pain Sites: No  Transfers: Sit to and from stand transfer with min assist with right hemiwalker; verbal cues for foot placement, even weight distribution and controlled descent.   Ambulation: Patient ambulated 94 feet with right hemiwalker and with mod assist and wheelchair follow. Patient ambulated with a step through gait pattern. Patient with a forward flexed posture.  Manual facilitation for weight shift and increased weight bearing on LLE during swing phase of RLE.  Manual facilitation for bilateral hip extension, approximation at left knee to prevent genu recurvatum and stabilization. Patient demonstrates significant improvement with sequence and attention throughout gait cycle. Patient requires increased time throughout gait cycle in order for porper sequence and technique.  Patient limited by fatigue.   Patient propelled wheelchair 200 feet with increased time with RUE and RLE and close supervision. Verbal cues for sequence and technique as well as obstacle negotiate.   Patient tolerated treatment  well. Vitals monitored and remained stable throughout session responding appropriately to activity. Patient returned to room at end of session with quick release belt engaged with all needs.    See Function Navigator for Current Functional Status.   Therapy/Group: Individual Therapy  Merri RayWISHART,James Holden 01/24/2016, 3:00 PM

## 2016-01-25 ENCOUNTER — Ambulatory Visit (HOSPITAL_COMMUNITY): Payer: BLUE CROSS/BLUE SHIELD | Admitting: Physical Therapy

## 2016-01-25 ENCOUNTER — Inpatient Hospital Stay (HOSPITAL_COMMUNITY): Payer: BLUE CROSS/BLUE SHIELD | Admitting: Physical Therapy

## 2016-01-25 ENCOUNTER — Inpatient Hospital Stay (HOSPITAL_COMMUNITY): Payer: BLUE CROSS/BLUE SHIELD | Admitting: Speech Pathology

## 2016-01-25 LAB — GLUCOSE, CAPILLARY
GLUCOSE-CAPILLARY: 171 mg/dL — AB (ref 65–99)
GLUCOSE-CAPILLARY: 145 mg/dL — AB (ref 65–99)
Glucose-Capillary: 132 mg/dL — ABNORMAL HIGH (ref 65–99)
Glucose-Capillary: 180 mg/dL — ABNORMAL HIGH (ref 65–99)

## 2016-01-25 NOTE — Progress Notes (Signed)
Subjective/Complaints: Has shouylder pain with Left shoulder ROM  ROS: pt denies nausea, vomiting, diarrhea, cough, shortness of breath or chest pain   Objective: Vital Signs: Blood pressure 107/60, pulse 63, temperature 98.2 F (36.8 C), temperature source Oral, resp. rate 18, height 5\' 10"  (1.778 m), weight 90 kg (198 lb 6.6 oz), SpO2 98 %. No results found. Results for orders placed or performed during the hospital encounter of 12/27/15 (from the past 72 hour(s))  Glucose, capillary     Status: Abnormal   Collection Time: 01/22/16 11:06 AM  Result Value Ref Range   Glucose-Capillary 163 (H) 65 - 99 mg/dL   Comment 1 Notify RN   Glucose, capillary     Status: Abnormal   Collection Time: 01/22/16  5:02 PM  Result Value Ref Range   Glucose-Capillary 183 (H) 65 - 99 mg/dL   Comment 1 Notify RN   Glucose, capillary     Status: Abnormal   Collection Time: 01/22/16  8:30 PM  Result Value Ref Range   Glucose-Capillary 128 (H) 65 - 99 mg/dL  Glucose, capillary     Status: Abnormal   Collection Time: 01/23/16  7:05 AM  Result Value Ref Range   Glucose-Capillary 180 (H) 65 - 99 mg/dL  Glucose, capillary     Status: Abnormal   Collection Time: 01/23/16 12:02 PM  Result Value Ref Range   Glucose-Capillary 162 (H) 65 - 99 mg/dL  Glucose, capillary     Status: Abnormal   Collection Time: 01/23/16  4:30 PM  Result Value Ref Range   Glucose-Capillary 153 (H) 65 - 99 mg/dL  Glucose, capillary     Status: Abnormal   Collection Time: 01/23/16  8:51 PM  Result Value Ref Range   Glucose-Capillary 167 (H) 65 - 99 mg/dL  Glucose, capillary     Status: Abnormal   Collection Time: 01/24/16  6:56 AM  Result Value Ref Range   Glucose-Capillary 138 (H) 65 - 99 mg/dL  Glucose, capillary     Status: Abnormal   Collection Time: 01/24/16 11:37 AM  Result Value Ref Range   Glucose-Capillary 208 (H) 65 - 99 mg/dL  Glucose, capillary     Status: Abnormal   Collection Time: 01/24/16  4:59 PM   Result Value Ref Range   Glucose-Capillary 172 (H) 65 - 99 mg/dL  Glucose, capillary     Status: Abnormal   Collection Time: 01/24/16  9:53 PM  Result Value Ref Range   Glucose-Capillary 185 (H) 65 - 99 mg/dL  Glucose, capillary     Status: Abnormal   Collection Time: 01/25/16  6:55 AM  Result Value Ref Range   Glucose-Capillary 132 (H) 65 - 99 mg/dL     Physical Exam: General: NAD ENT- former trach site CDI,  Psych: Mood and affect are appropriate Head: craniectomy site without swelling noted Heart: RRR no jvd Lungs: clear with reg effort Abdomen: +BS, soft.former PEG site small amt dried blood , bandage changed Musc: no edema. No tenderness. Skin: skin intact. Neurologic: left inattention and field cut Motor strength is 5/5 in Right  deltoid, bicep, tricep, grip, hip flexor, knee extensors, ankle dorsiflexor and plantar flexor 2-/5 Left hip flexion  LUE: 0/5 prox to distal.  Sensation diminished to light touch LUE  Assessment/Plan: 1. Functional deficits secondary to RIght MCA infarct with left hemiplegia which require 3+ hours per day of interdisciplinary therapy in a comprehensive inpatient rehab setting. Physiatrist is providing close team supervision and 24 hour management of  active medical problems listed below. Physiatrist and rehab team continue to assess barriers to discharge/monitor patient progress toward functional and medical goals. FIM: Function - Bathing Position: Shower Body parts bathed by patient: Left arm, Chest, Abdomen, Right upper leg, Left upper leg, Back Body parts bathed by helper: Right arm, Front perineal area, Buttocks, Right lower leg, Left lower leg, Back Bathing not applicable: Front perineal area, Buttocks (did not attempt this session) Assist Level: Touching or steadying assistance(Pt > 75%)  Function- Upper Body Dressing/Undressing What is the patient wearing?: Pull over shirt/dress Pull over shirt/dress - Perfomed by patient:  Thread/unthread right sleeve, Put head through opening Pull over shirt/dress - Perfomed by helper: Thread/unthread left sleeve, Pull shirt over trunk Assist Level: Touching or steadying assistance(Pt > 75%) Function - Lower Body Dressing/Undressing What is the patient wearing?: Pants, Faythe D[place], Shoes Position: Research officer, trade union at [employer]- Performed by patient: Thread/unthread right pants leg Pants- Performed by helper: Thread/unthread left pants leg, Pull pants up/down Non-skid slipper socks- Performed by helper: Don/doff right sock Socks - Performed by patient: Don/doff right sock, Don/doff left sock Socks - Performed by helper: Don/doff right sock, Don/doff left sock Shoes - Performed by patient: Don/doff right shoe Shoes - Performed by helper: Don/doff left shoe TED Hose - Performed by patient: Don/doff right TED hose, Don/doff left TED hose TED Hose - Performed by helper: Don/doff right TED hose, Don/doff left TED hose Assist for footwear: Partial/moderate assist Assist for lower body dressing: Touching or steadying assistance (Pt > 75%)  Function - Toileting Toileting activity did not occur: Safety/medical concerns Toileting steps completed by helper: Adjust clothing prior to toileting, Performs perineal hygiene, Adjust clothing after toileting Toileting Assistive Devices: Other (comment) (lift) Assist level: Two helpers  Function - Archivist transfer activity did not occur: Safety/medical concerns Toilet transfer assistive device: Systems developer lift: [place] Dec Assist level to toilet: 2 helpers Assist level from toilet: 2 helpers  Function - Chair/bed transfer Chair/bed transfer method: Squat pivot Chair/bed transfer assist level: Total assist (Pt < 25%) Chair/bed transfer assistive device: Armrests Mechanical lift: Stedy Chair/bed transfer details: Verbal cues for technique, Tactile cues for weight shifting, Tactile cues for sequencing, Verbal cues  for sequencing, Manual facilitation for weight shifting  Function - Locomotion: Wheelchair Will patient use wheelchair at discharge?: Yes Type: Manual Max wheelchair distance: 122ft  Assist Level: Supervision or verbal cues Assist Level: Supervision or verbal cues Wheel 150 feet activity did not occur: Safety/medical concerns Assist Level: Supervision or verbal cues Turns around,maneuvers to table,bed, and toilet,negotiates 3% grade,maneuvers on rugs and over doorsills: No Function - Locomotion: Ambulation Ambulation activity did not occur: Safety/medical concerns Assistive device: Parallel bars, Orthosis Max distance: 57ft  Assist level: 2 helpers Walk 10 feet activity did not occur: Safety/medical concerns Assist level: Moderate assist (Pt 50 - 74%) Walk 50 feet with 2 turns activity did not occur: Safety/medical concerns Walk 150 feet activity did not occur: Safety/medical concerns Walk 10 feet on uneven surfaces activity did not occur: Safety/medical concerns  Function - Comprehension Comprehension: Auditory Comprehension assist level: Follows basic conversation/direction with no assist  Function - Expression Expression: Verbal Expression assist level: Expresses basic needs/ideas: With no assist  Function - Social Interaction Social Interaction assist level: Interacts appropriately 75 - 89% of the time - Needs redirection for appropriate language or to initiate interaction.  Function - Problem Solving Problem solving assist level: Solves basic 50 - 74% of the time/requires cueing 25 - 49% of the  time  Function - Memory Memory assist level: Recognizes or recalls 75 - 89% of the time/requires cueing 10 - 24% of the time Patient normally able to recall (first 3 days only): Current season, That he or she is in a hospital  Medical Problem List and Plan: 1.  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  .             -continue PT,  OT, SLP  -ELOS 01/28/16,encouraged pt to stay motivated    2.  DVT Prophylaxis/Anticoagulation: Subcutaneous heparin for DVT prophylaxis 3. Pain Management/chronic back pain: Oxycodone 5 mg every 6 hours as needed  -continue baclofen prn for cramps/spasms.    -Left hand and wrist pain improved with splint        4. Mood: Prozac 40 mg daily.  Abilify 5 mg daily 5. Neuropsych: This patient is capable of making decisions on his own behalf. ritalin 10mg  BID, appreciate Neuropsych eval - staff will need to limit amt of info presented to pt at one time      6. Skin/Wound Care: Routine skin checks 7. Fluids/Electrolytes/Nutrition: appreciate dietary note 8.H/O  Tracheostomy Decannulated 12/25/2015--healed 9. Gastrostomy PEG tube 12/05/2015. Nocturnal tube feeds recently discontinued. Currently on dysphagia #3/thin.              -PEG d/ced with traction removal minimal bleeding. 4x4 dressing applied, 10. Hypertension.     -continue amlodipine 5 mg daily, lisinopril 30 mg daily with hold parameters Some fluctuations/lability, may reduce lisinopril Vitals:   01/24/16 1359 01/25/16 0604  BP: 112/62 107/60  Pulse: 87 63  Resp: 18 18  Temp: 97.7 F (36.5 C) 98.2 F (36.8 C)   11. Diabetes mellitus.  Intake is good  Check blood sugars before meals and at bedtime  Fair control at present.  increase lantus  to 33 units,tighten control  Recent Labs  01/24/16 1659 01/24/16 2153 01/25/16 0655  GLUCAP 172* 185* 132*   12. Hyperlipidemia. Lipitor 80 mg daily at bedtime 13.Tobacco abuse. Counseling as appropriate 14.  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 ?RLS- pt c/o symptoms BLE prior to CVA   -low dose requip remains effective, will add  trazodone for sleep    LOS (Days) 29 A FACE TO FACE EVALUATION WAS PERFORMED  Jamai Dolce E 01/25/2016, 9:05 AM

## 2016-01-25 NOTE — Progress Notes (Signed)
Physical Therapy Session Note  Patient Details  Name: James Holden MRN: [id number] Date of Birth: [birth year]  Today's Date: 01/24/2016 PT Individual [id number] AND [id number]   PT Individual Time Calculation: 18 min AND 60 min    Short Term Goals: Week 4:  PT Short Term Goal 1 (Week 4): LTG = STG due to ELOS  Skilled Therapeutic Interventions/Progress Updates:     Session 1:  Pt patient received sitting in WC and agreeable to PT.   PT instructed patient in gait training x 21 ft with Mod assist +2 for WC follow and orthotist present for assessment. PT provided moderate manual facilitation for limb advancement and approximation throughout the knee to prevent buckling and prevent supination of the L ankle. Tactile feedback also required to prevent genu recurvatum. Orthotist recommend ridged PLF Reaction AFO with Medial upright and T strap to control Supination in the L ankle, also suggested exterior foot support PLF AFO due to constant great toe hyper extension with activity and to assist To reducing Supination tone.  Patient returned to room and left sitting in [facility] with all needs met.   Session 2:   Patient received sitting in WC and agreeable to PT.  PT instructed patient in car transfer with SB and min assist from PT to control the LLE as well as position SB properly. Mod cues for UE placement and improve anterior weight shift.   Pt transported to [facility] gift shop. WC mobility through simulated community environment.  Supervision assist overall with occasional min assist and max cues for awareness of L side to follow pathway and avoid obstacles.   Pts wife present for second half of treatment for training with transfers.  PT instructed pt in unlevel SB transfer with min assist x 4 with moderate cues to wife through demonstration for proper positioning of SB, proper blocking with LLE, and cues for trunk movement.  Patient wife assisted with level SB transfer x 2 with mod assist.  Additional cues for protection of the LLE. PT also instructed patient's wife in assistance for bed mobility entering bed on L side. Cues for use of RLE to control L and use EOB to come to sitting.   Patient returned to room and left sitting in [facility] with all needs met.   Therapy Documentation Precautions:  Precautions Precautions: Fall Precaution Comments: L hemiplegia, R hemicraniotomy  Required Braces or Orthoses: Other Brace/Splint Other Brace/Splint: Helmet when OOB, PRAFO on left foot Restrictions Weight Bearing Restrictions: No   Pain:   0/10   See Function Navigator for Current Functional Status.   Therapy/Group: Individual Therapy  Lorie Phenix 01/25/2016, 5:49 AM

## 2016-01-25 NOTE — Progress Notes (Signed)
Speech Language Pathology Daily Session Note  Patient Details  Name: James Holden MRN: [id number] Date of Birth: 07/19/1960  Today's Date: 01/25/2016 SLP Individual Time: 1000-1030 SLP Individual Time Calculation (min): 30 min   Short Term Goals: Week 5: SLP Short Term Goal 1 (Week 5): Patient will consume regular textures and thin liquids without overt s/s of aspiration with Mod I for use of swallowing compensatory strategies.  SLP Short Term Goal 2 (Week 5): Patient will maintain eye contact at midline to conversation partner during functional conversation/task for >1 minute in >50% of opportunities with Mod A verbal and question cues.  SLP Short Term Goal 3 (Week 5): Patient will perform visual scanning tasks to locate items at midline with Mod A multimodal cues in 75% of opportunities.  SLP Short Term Goal 4 (Week 5): Patient will demonstrate basic problem solving in functional tasks with Mod A verbal and question cues.  Skilled Therapeutic Interventions: Skilled treatment session focused on education to pt,  Wife and son on cognitive abilities and discharge planning. Pt required question cues to state current deficits and strategies to compensate for memory, left inattention and decreased overall attention. Pt required Mod A verbal cues to maintain eye contact at midline as well as Mod A verbal cues for basic problems solving (i.e., donning is helmet). All questions were answered to pt and family satisfaction at this time. Pt left upright in wheelchair with half-laptray in place and family present. Continue current plan of care until discharge on 01/28/2016.   Function:    Cognition Comprehension Comprehension assist level: Follows basic conversation/direction with no assist  Expression   Expression assist level: Expresses basic needs/ideas: With no assist  Social Interaction Social Interaction assist level: Interacts appropriately 75 - 89% of the time - Needs redirection for  appropriate language or to initiate interaction.  Problem Solving Problem solving assist level: Solves basic 50 - 74% of the time/requires cueing 25 - 49% of the time  Memory Memory assist level: Recognizes or recalls 75 - 89% of the time/requires cueing 10 - 24% of the time    Pain    Therapy/Group: Individual Therapy  Werner Labella 01/25/2016, 11:00 AM

## 2016-01-26 ENCOUNTER — Inpatient Hospital Stay (HOSPITAL_COMMUNITY): Payer: BLUE CROSS/BLUE SHIELD | Admitting: Occupational Therapy

## 2016-01-26 ENCOUNTER — Inpatient Hospital Stay (HOSPITAL_COMMUNITY): Payer: BLUE CROSS/BLUE SHIELD | Admitting: *Deleted

## 2016-01-26 LAB — GLUCOSE, CAPILLARY
Glucose-Capillary: 142 mg/dL — ABNORMAL HIGH (ref 65–99)
Glucose-Capillary: 165 mg/dL — ABNORMAL HIGH (ref 65–99)
Glucose-Capillary: 172 mg/dL — ABNORMAL HIGH (ref 65–99)
Glucose-Capillary: 168 mg/dL — ABNORMAL HIGH (ref 65–99)

## 2016-01-26 MED ORDER — INSULIN GLARGINE 100 UNIT/ML ~~LOC~~ SOLN
33.0000 [IU] | Freq: Every day | SUBCUTANEOUS | Status: DC
  Administered 2016-01-26 – 2016-01-27 (×2): 33 [IU] via SUBCUTANEOUS
  Filled 2016-01-26 (×3): qty 0.33

## 2016-01-26 NOTE — Progress Notes (Signed)
Subjective/Complaints: No issues overnite per pt.  Very pleased with family ed session yesterday, car xfers with sone went well  ROS: pt denies nausea, vomiting, diarrhea, cough, shortness of breath or chest pain   Objective: Vital Signs: Blood pressure (!) 120/55, pulse (!) 44, temperature 98.1 F (36.7 C), temperature source Oral, resp. rate 18, height 5\' 10"  (1.778 m), weight 84 kg (185 lb 3 oz), SpO2 98 %. No results found. Results for orders placed or performed during the hospital encounter of 12/27/15 (from the past 72 hour(s))  Glucose, capillary     Status: Abnormal   Collection Time: 01/23/16 12:02 PM  Result Value Ref Range   Glucose-Capillary 162 (H) 65 - 99 mg/dL  Glucose, capillary     Status: Abnormal   Collection Time: 01/23/16  4:30 PM  Result Value Ref Range   Glucose-Capillary 153 (H) 65 - 99 mg/dL  Glucose, capillary     Status: Abnormal   Collection Time: 01/23/16  8:51 PM  Result Value Ref Range   Glucose-Capillary 167 (H) 65 - 99 mg/dL  Glucose, capillary     Status: Abnormal   Collection Time: 01/24/16  6:56 AM  Result Value Ref Range   Glucose-Capillary 138 (H) 65 - 99 mg/dL  Glucose, capillary     Status: Abnormal   Collection Time: 01/24/16 11:37 AM  Result Value Ref Range   Glucose-Capillary 208 (H) 65 - 99 mg/dL  Glucose, capillary     Status: Abnormal   Collection Time: 01/24/16  4:59 PM  Result Value Ref Range   Glucose-Capillary 172 (H) 65 - 99 mg/dL  Glucose, capillary     Status: Abnormal   Collection Time: 01/24/16  9:53 PM  Result Value Ref Range   Glucose-Capillary 185 (H) 65 - 99 mg/dL  Glucose, capillary     Status: Abnormal   Collection Time: 01/25/16  6:55 AM  Result Value Ref Range   Glucose-Capillary 132 (H) 65 - 99 mg/dL  Glucose, capillary     Status: Abnormal   Collection Time: 01/25/16 11:59 AM  Result Value Ref Range   Glucose-Capillary 180 (H) 65 - 99 mg/dL  Glucose, capillary     Status: Abnormal   Collection Time:  01/25/16  4:59 PM  Result Value Ref Range   Glucose-Capillary 171 (H) 65 - 99 mg/dL  Glucose, capillary     Status: Abnormal   Collection Time: 01/25/16  9:18 PM  Result Value Ref Range   Glucose-Capillary 145 (H) 65 - 99 mg/dL  Glucose, capillary     Status: Abnormal   Collection Time: 01/26/16  6:42 AM  Result Value Ref Range   Glucose-Capillary 142 (H) 65 - 99 mg/dL     Physical Exam: General: NAD ENT- former trach site CDI,  Psych: Mood and affect are appropriate Head: craniectomy site without swelling noted Heart: RRR no jvd Lungs: clear with reg effort Abdomen: +BS, soft.former PEG NT , bandage Musc: no edema. No tenderness. Skin: skin intact. Neurologic: left inattention and field cut Motor strength is 5/5 in Right  deltoid, bicep, tricep, grip, hip flexor, knee extensors, ankle dorsiflexor and plantar flexor 2-/5 Left hip flexion  LUE: 0/5 prox to distal.  Sensation diminished to light touch LUE  Assessment/Plan: 1. Functional deficits secondary to RIght MCA infarct with left hemiplegia which require 3+ hours per day of interdisciplinary therapy in a comprehensive inpatient rehab setting. Physiatrist is providing close team supervision and 24 hour management of active medical problems listed below.  Physiatrist and rehab team continue to assess barriers to discharge/monitor patient progress toward functional and medical goals. FIM: Function - Bathing Position: Shower Body parts bathed by patient: Left arm, Chest, Abdomen, Right upper leg, Left upper leg, Back Body parts bathed by helper: Right arm, Front perineal area, Buttocks, Right lower leg, Left lower leg, Back Bathing not applicable: Front perineal area, Buttocks (did not attempt this session) Assist Level: Touching or steadying assistance(Pt > 75%)  Function- Upper Body Dressing/Undressing What is the patient wearing?: Pull over shirt/dress Pull over shirt/dress - Perfomed by patient: Thread/unthread right  sleeve, Put head through opening Pull over shirt/dress - Perfomed by helper: Thread/unthread left sleeve, Pull shirt over trunk Assist Level: Touching or steadying assistance(Pt > 75%) Function - Lower Body Dressing/Undressing What is the patient wearing?: Pants, Faythe [place], Shoes Position: Research officer, trade union at [employer]- Performed by patient: Thread/unthread right pants leg Pants- Performed by helper: Thread/unthread left pants leg, Pull pants up/down Non-skid slipper socks- Performed by helper: Don/doff right sock Socks - Performed by patient: Don/doff right sock, Don/doff left sock Socks - Performed by helper: Don/doff right sock, Don/doff left sock Shoes - Performed by patient: Don/doff right shoe Shoes - Performed by helper: Don/doff left shoe TED Hose - Performed by patient: Don/doff right TED hose, Don/doff left TED hose TED Hose - Performed by helper: Don/doff right TED hose, Don/doff left TED hose Assist for footwear: Partial/moderate assist Assist for lower body dressing: Touching or steadying assistance (Pt > 75%)  Function - Toileting Toileting activity did not occur: Safety/medical concerns Toileting steps completed by helper: Adjust clothing prior to toileting, Performs perineal hygiene, Adjust clothing after toileting Toileting Assistive Devices: Other (comment) (lift) Assist level: Two helpers  Function - Archivist transfer activity did not occur: Safety/medical concerns Toilet transfer assistive device: Systems developer lift: Hunt[place] Dec Assist level to toilet: 2 helpers Assist level from toilet: 2 helpers  Function - Chair/bed transfer Chair/bed transfer method: Squat pivot Chair/bed transfer assist level: Moderate assist (Pt 50 - 74%/lift or lower) Chair/bed transfer assistive device: Armrests Mechanical lift: Stedy Chair/bed transfer details: Verbal cues for technique, Tactile cues for weight shifting, Tactile cues for sequencing, Verbal cues for  sequencing, Manual facilitation for weight shifting  Function - Locomotion: Wheelchair Will patient use wheelchair at discharge?: Yes Type: Manual Max wheelchair distance: 30 Assist Level: Supervision or verbal cues Assist Level: Supervision or verbal cues Wheel 150 feet activity did not occur: Safety/medical concerns Assist Level: Supervision or verbal cues Turns around,maneuvers to table,bed, and toilet,negotiates 3% grade,maneuvers on rugs and over doorsills: No Function - Locomotion: Ambulation Ambulation activity did not occur: Safety/medical concerns Assistive device: Walker-hemi Max distance: 15 Assist level: 2 helpers Walk 10 feet activity did not occur: Safety/medical concerns Assist level: Moderate assist (Pt 50 - 74%) Walk 50 feet with 2 turns activity did not occur: Safety/medical concerns Walk 150 feet activity did not occur: Safety/medical concerns Walk 10 feet on uneven surfaces activity did not occur: Safety/medical concerns  Function - Comprehension Comprehension: Auditory Comprehension assist level: Follows basic conversation/direction with no assist  Function - Expression Expression: Verbal Expression assist level: Expresses basic needs/ideas: With no assist  Function - Social Interaction Social Interaction assist level: Interacts appropriately 75 - 89% of the time - Needs redirection for appropriate language or to initiate interaction.  Function - Problem Solving Problem solving assist level: Solves basic 50 - 74% of the time/requires cueing 25 - 49% of the time  Function - Memory Memory  assist level: Recognizes or recalls 75 - 89% of the time/requires cueing 10 - 24% of the time Patient normally able to recall (first 3 days only): Current season, That he or she is in a hospital, Staff names and faces  Medical Problem List and Plan: 1.  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  .             -continue PT,  OT, SLP  -ELOS 01/28/16,cont family training    2.  DVT Prophylaxis/Anticoagulation: Subcutaneous heparin for DVT prophylaxis 3. Pain Management/chronic back pain: Oxycodone 5 mg every 6 hours as needed  -continue baclofen prn for cramps/spasms.    -Left hand and wrist pain improved with splint        4. Mood: Prozac 40 mg daily.  Abilify 5 mg daily 5. Neuropsych: This patient is capable of making decisions on his own behalf. ritalin 10mg  BID, appreciate Neuropsych eval - staff will need to limit amt of info presented to pt at one time      6. Skin/Wound Care: Routine skin checks 7. Fluids/Electrolytes/Nutrition: appreciate dietary note 8.H/O  Tracheostomy Decannulated 12/25/2015--healed 9. Gastrostomy PEG tube 12/05/2015. Nocturnal tube feeds recently discontinued. Currently on dysphagia #3/thin.              -PEG d/ced with traction removal minimal bleeding. 4x4 dressing applied, 10. Hypertension.     -continue amlodipine 5 mg daily, lisinopril 30 mg daily with hold parameters Some fluctuations/lability, cont current meds Vitals:   01/26/16 0604 01/26/16 0806  BP: 132/68 (!) 120/55  Pulse: (!) 44   Resp: 18   Temp: 98.1 F (36.7 C)    11. Diabetes mellitus.  Intake is good  Check blood sugars before meals and at bedtime  Fair control at present.  increase lantus  to 33 units,tighten control  Recent Labs  01/25/16 1659 01/25/16 2118 01/26/16 0642  GLUCAP 171* 145* 142*   12. Hyperlipidemia. Lipitor 80 mg daily at bedtime 13.Tobacco abuse. Counseling as appropriate 14.  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 ?RLS- pt c/o symptoms BLE prior to CVA   -low dose requip remains effective, will add trazodone for sleep    LOS  (Days) 30 A FACE TO FACE EVALUATION WAS PERFORMED  Telissa Palmisano E 01/26/2016, 9:20 AM

## 2016-01-26 NOTE — Progress Notes (Signed)
Physical Therapy Session Note  Patient Details  Name: James Holden MRN: [id number] Date of Birth: 12/27/1960  Today's Date: 01/25/2016 PT Individual Time:   1101-1200 AND 1400-1440 PT Individual Time Calculation: 59 AND 40 min   Short Term Goals: Week 4:  PT Short Term Goal 1 (Week 4): LTG = STG due to ELOS  Skilled Therapeutic Interventions/Progress Updates:     [facility] Family education.  Pt instructed patient in WC mobility x 17200ft with R hemi technique and max cues for awareness of the L side. Family educated in proper technique for providing instruction to encourage improve visual scanning to the L.   Family educate for car transfer at small SUV height with stand pivot technique and mod assist for LLE limb control and awareness of the L Side. Car transfer completes x 2, once with PT once with son with mod cues for proper positioning and cueing technique for patient to utilize [facility] for support.   PT instructed patient in wife and son in SB transfers to various heights with min assist to stabilize board. Cues also provided for proper WC set up, proper board placement, and how to cue pt for improve techique to push through R UE and BLE.   Stair/curb training with 2 person bump up technique. Moderate verbal and visual instruction for use of WC frame safety with ascent. Backward technique for descent with one person. For one step. Instruction provided for 2 people forward descent for multiple steps if needed.   [facility] returned to room and [facility] in reach.   Session 2.   PT educated family on safety of car transfer to level seat if possible in sedan. Family stated only options for d/c are sports car or small SUV. Car transfer training to and from low sports car height with min assist using SB. Educated family on sports car my being safer for transfer compared to SUV.   Squat pivot transfer transfer training as  well as sit<>stand with HW. Min assist provided for transfers with max cued for patient for proper UE placement as awareness of L side. Son demonstrated some difficulty with set up for squat pivot transfer and reports feeling safer with stand pivot.   Patient returned to room and left supine in bed following supervision bed mobility transfer without rails and mod cues for use of R LE to control the L.     Therapy Documentation Precautions:  Precautions Precautions: Fall Precaution Comments: L hemiplegia, R hemicraniotomy  Required Braces or Orthoses: Other Brace/Splint Other Brace/Splint: Helmet when OOB, PRAFO on left foot Restrictions Weight Bearing Restrictions: No   See Function Navigator for Current Functional Status.   Therapy/Group: Individual Therapy  [name] 01/26/2016, 6:01 AM

## 2016-01-26 NOTE — Progress Notes (Signed)
Physical Therapy Session Note  Patient Details  Name: James Holden MRN: [id number] Date of Birth: [birth year]  Today's Date: 01/26/2016 PT Individual Time: 0800-0900 PT Individual Time Calculation (min): 60 min    Short Term Goals:Week 3:  PT Short Term Goal 1 (Week 3): Patient will perform SB transfers with min assist to R and Mod assist consistently to L PT Short Term Goal 1 - Progress (Week 3): Met PT Short Term Goal 2 (Week 3): Patient will perform squat pivot transfer to L and R with mod assist  PT Short Term Goal 2 - Progress (Week 3): Met PT Short Term Goal 3 (Week 3): Patient will ambulate 16f with LRAD and max assist  PT Short Term Goal 3 - Progress (Week 3): Met PT Short Term Goal 4 (Week 3): Patient will propel WC with supervision assist and only min cues for awareness of L side  PT Short Term Goal 4 - Progress (Week 3): Partly met Week 4:  PT Short Term Goal 1 (Week 4): LTG = STG due to ELOS  Skilled Therapeutic Interventions/Progress Updates:    Tx focused on functional mobility training, gait with hemi-walker, and NMR via forced use, manual facilitation, and multi-modal cues. PT sleepign upin arrival, slow to get moving.   Bed mobility requipred S for rolling and increased time and cues for sequence. Mod A supine>it via sidelying with cyues for sequence.  Pt sat unsupported EOB x8 min for meds and bites of breakfast with manual facilitation for postural control and L-sided attention and trackign tasks. Dynamic sitting balance EOB x5 min with close S and postural cues.   Squat-poivot transfer with Mod A. Pt  Challenged to direct self cares and set-up, needing Mod cues for safe and effective set-up/execution.   Pt propelled WC in controlled setting with hemi-technique x30' with S cues for L-sided attention and safety.   Gait in hall and in apartment carpet with hemi-walker and air cast 1x15' each setting with Mod A in controlled setting, but Max A on carpet for LLE  management, trunk control, and sequence cues, +2 WC follow.  Pre-gait NMR for RLE step forward/backwards at hall rail for full step practice with upright head/trunk. PT provided manual facilitation for LLE management at knee and hip. [facility] with all needs in reach.     Therapy Documentation Precautions:  Precautions Precautions: Fall Precaution Comments: L hemiplegia, R hemicraniotomy  Required Braces or Orthoses: Other Brace/Splint Other Brace/Splint: Helmet when OOB, PRAFO on left foot Restrictions Weight Bearing Restrictions: No General:   Vital Signs: Therapy Vitals Temp: 98.1 F (36.7 C) Temp Source: Oral Pulse Rate: (!) 44 Resp: 18 BP: (!) 120/55 Patient Position (if appropriate): Lying Oxygen Therapy SpO2: 98 % O2 Device: Not Delivered Pain: none   See Function Navigator for Current Functional Status.   Therapy/Group: Individual Therapy  Loukas Antonson, CCorinna Lines PT, DPT  01/26/2016, 8:32 AM

## 2016-01-26 NOTE — Progress Notes (Signed)
Occupational Therapy Session Note  Patient Details  Name: James GentaJarvis George Biggar MRN: [id number] Date of Birth: 01/10/1961  Today's Date: 01/26/2016 OT Individual Time: [id number] OT Individual Time Calculation (min): 59 min   Skilled Therapeutic Interventions/Progress Updates:   Pt was sitting in w/c at time of arrival, agreeable to tx. Family was not present with him. Oral care completed w/c level at sink with items placed on left side to promote visual field scanning with extra time and min vcs. L UE WB facilitated by therapist on sink. Pt then encouraged to change soiled shirt. Pt positioned w/c with Min A near dresser to find clean shirt to don. Mod cues for figure ground and scanning to left side. With extra time and max verbal/tactile cues, pt able to don shirt with Min A. Family still not present at this time and pt asked to call spouse. She reported that she would not be present until afternoon session due to inclement weather. Therefore, remainder of session completed in BI gym with pt completing [employer] activity w/c level with settings isolated to left lower quadrant to address left neglect. Reaction time improved from 12 sec to 6.4 sec during two 4 minute trials. Mod cues for attention and scanning to farthest section of left periphery. At end of session pt was returned to room, left with safety belt, resting hand splint, and all needs within reach.   Therapy Documentation Precautions:  Precautions Precautions: Fall Precaution Comments: L hemiplegia, R hemicraniotomy  Required Braces or Orthoses: Other Brace/Splint Other Brace/Splint: Helmet when OOB, PRAFO on left foot Restrictions Weight Bearing Restrictions: No  Pain: No c/o pain during session    ADL: ADL ADL Comments: Please see functional navigator    See Function Navigator for Current Functional Status.   Therapy/Group: Individual Therapy  Charisse Wendell A Kayly Kriegel 01/26/2016, 12:48 PM

## 2016-01-26 NOTE — Progress Notes (Signed)
C/O nausea, PRN zofran given at HS, with relief. James Holden, James Holden A

## 2016-01-27 ENCOUNTER — Inpatient Hospital Stay (HOSPITAL_COMMUNITY): Payer: BLUE CROSS/BLUE SHIELD | Admitting: Speech Pathology

## 2016-01-27 ENCOUNTER — Inpatient Hospital Stay (HOSPITAL_COMMUNITY): Payer: BLUE CROSS/BLUE SHIELD

## 2016-01-27 ENCOUNTER — Inpatient Hospital Stay (HOSPITAL_COMMUNITY): Payer: BLUE CROSS/BLUE SHIELD | Admitting: Occupational Therapy

## 2016-01-27 LAB — GLUCOSE, CAPILLARY
GLUCOSE-CAPILLARY: 104 mg/dL — AB (ref 65–99)
GLUCOSE-CAPILLARY: 148 mg/dL — AB (ref 65–99)
Glucose-Capillary: 165 mg/dL — ABNORMAL HIGH (ref 65–99)
Glucose-Capillary: 146 mg/dL — ABNORMAL HIGH (ref 65–99)

## 2016-01-27 NOTE — Progress Notes (Signed)
Social Work Patient ID: James Holden, male   DOB: 1960/05/12, 55 y.o.   MRN: [id number]  Met with wife who was here for education and she reports it is going well. She could not make it Sat due to The weather and missed his therapies. Their son was suppose to come but is not here either. Discussed need for a hospital bed both felt it was not necessary, made aware could order one once If home and changed their minds. Will see how the rest of the day goes in therapies.

## 2016-01-27 NOTE — Progress Notes (Signed)
Subjective/Complaints: No problems this am. Denies pain.   ROS: pt denies nausea, vomiting, diarrhea, cough, shortness of breath or chest pain   Objective: Vital Signs: Blood pressure 133/75, pulse (!) 43, temperature 97.4 F (36.3 C), temperature source Oral, resp. rate 18, height 5\' 10"  (1.778 m), weight 92.7 kg (204 lb 5.9 oz), SpO2 100 %. No results found. Results for orders placed or performed during the hospital encounter of 12/27/15 (from the past 72 hour(s))  Glucose, capillary     Status: Abnormal   Collection Time: 01/24/16 11:37 AM  Result Value Ref Range   Glucose-Capillary 208 (H) 65 - 99 mg/dL  Glucose, capillary     Status: Abnormal   Collection Time: 01/24/16  4:59 PM  Result Value Ref Range   Glucose-Capillary 172 (H) 65 - 99 mg/dL  Glucose, capillary     Status: Abnormal   Collection Time: 01/24/16  9:53 PM  Result Value Ref Range   Glucose-Capillary 185 (H) 65 - 99 mg/dL  Glucose, capillary     Status: Abnormal   Collection Time: 01/25/16  6:55 AM  Result Value Ref Range   Glucose-Capillary 132 (H) 65 - 99 mg/dL  Glucose, capillary     Status: Abnormal   Collection Time: 01/25/16 11:59 AM  Result Value Ref Range   Glucose-Capillary 180 (H) 65 - 99 mg/dL  Glucose, capillary     Status: Abnormal   Collection Time: 01/25/16  4:59 PM  Result Value Ref Range   Glucose-Capillary 171 (H) 65 - 99 mg/dL  Glucose, capillary     Status: Abnormal   Collection Time: 01/25/16  9:18 PM  Result Value Ref Range   Glucose-Capillary 145 (H) 65 - 99 mg/dL  Glucose, capillary     Status: Abnormal   Collection Time: 01/26/16  6:42 AM  Result Value Ref Range   Glucose-Capillary 142 (H) 65 - 99 mg/dL  Glucose, capillary     Status: Abnormal   Collection Time: 01/26/16 11:39 AM  Result Value Ref Range   Glucose-Capillary 165 (H) 65 - 99 mg/dL  Glucose, capillary     Status: Abnormal   Collection Time: 01/26/16  4:41 PM  Result Value Ref Range   Glucose-Capillary 172  (H) 65 - 99 mg/dL  Glucose, capillary     Status: Abnormal   Collection Time: 01/26/16  9:09 PM  Result Value Ref Range   Glucose-Capillary 168 (H) 65 - 99 mg/dL  Glucose, capillary     Status: Abnormal   Collection Time: 01/27/16  6:24 AM  Result Value Ref Range   Glucose-Capillary 104 (H) 65 - 99 mg/dL     Physical Exam: General: NAD ENT- former trach site CDI,  Psych: Mood and affect are appropriate Head: craniectomy site without swelling noted Heart: RRR no jvd Lungs: clear with reg effort Abdomen: +BS, soft.former PEG NT , bandage Musc: no edema. No tenderness. Skin: skin intact. Neurologic: left inattention and field cut Motor strength is 5/5 in Right  deltoid, bicep, tricep, grip, hip flexor, knee extensors, ankle dorsiflexor and plantar flexor 2-/5 Left hip flexion  LUE: 0/5 prox to distal.  Sensation diminished to light touch LUE  Assessment/Plan: 1. Functional deficits secondary to RIght MCA infarct with left hemiplegia which require 3+ hours per day of interdisciplinary therapy in a comprehensive inpatient rehab setting. Physiatrist is providing close team supervision and 24 hour management of active medical problems listed below. Physiatrist and rehab team continue to assess barriers to discharge/monitor patient progress toward  functional and medical goals. FIM: Function - Bathing Position: Shower Body parts bathed by patient: Left arm, Chest, Abdomen, Right upper leg, Left upper leg, Back Body parts bathed by helper: Right arm, Front perineal area, Buttocks, Right lower leg, Left lower leg, Back Bathing not applicable: Front perineal area, Buttocks (did not attempt this session) Assist Level: Touching or steadying assistance(Pt > 75%)  Function- Upper Body Dressing/Undressing What is the patient wearing?: Pull over shirt/dress Pull over shirt/dress - Perfomed by patient: Thread/unthread right sleeve, Put head through opening, Pull shirt over trunk Pull over  shirt/dress - Perfomed by helper: Thread/unthread left sleeve Assist Level: Touching or steadying assistance(Pt > 75%) Function - Lower Body Dressing/Undressing What is the patient wearing?: Pants, Faythe Dingwalled Hose, Shoes Position: Research officer, trade unionWheelchair/chair at Harrah[employer] Entertainmentsink Pants- Performed by patient: Thread/unthread right pants leg Pants- Performed by helper: Thread/unthread left pants leg, Pull pants up/down Non-skid slipper socks- Performed by helper: Don/doff right sock Socks - Performed by patient: Don/doff right sock, Don/doff left sock Socks - Performed by helper: Don/doff right sock, Don/doff left sock Shoes - Performed by patient: Don/doff right shoe Shoes - Performed by helper: Don/doff left shoe TED Hose - Performed by patient: Don/doff right TED hose, Don/doff left TED hose TED Hose - Performed by helper: Don/doff right TED hose, Don/doff left TED hose Assist for footwear: Partial/moderate assist Assist for lower body dressing: Touching or steadying assistance (Pt > 75%)  Function - Toileting Toileting activity did not occur: Safety/medical concerns Toileting steps completed by helper: Adjust clothing prior to toileting, Performs perineal hygiene, Adjust clothing after toileting Toileting Assistive Devices: Other (comment) (lift) Assist level: Two helpers  Function - ArchivistToilet Transfers Toilet transfer activity did not occur: Safety/medical concerns Toilet transfer assistive device: Systems developerMechanical lift Mechanical lift: Huntley DecSara Assist level to toilet: 2 helpers Assist level from toilet: 2 helpers  Function - Chair/bed transfer Chair/bed transfer method: Squat pivot Chair/bed transfer assist level: Moderate assist (Pt 50 - 74%/lift or lower) Chair/bed transfer assistive device: Armrests Mechanical lift: Stedy Chair/bed transfer details: Verbal cues for technique, Tactile cues for weight shifting, Tactile cues for sequencing, Verbal cues for sequencing, Manual facilitation for weight shifting  Function -  Locomotion: Wheelchair Will patient use wheelchair at discharge?: Yes Type: Manual Max wheelchair distance: 30 Assist Level: Supervision or verbal cues Assist Level: Supervision or verbal cues Wheel 150 feet activity did not occur: Safety/medical concerns Assist Level: Supervision or verbal cues Turns around,maneuvers to table,bed, and toilet,negotiates 3% grade,maneuvers on rugs and over doorsills: No Function - Locomotion: Ambulation Ambulation activity did not occur: Safety/medical concerns Assistive device: Walker-hemi Max distance: 15 Assist level: 2 helpers Walk 10 feet activity did not occur: Safety/medical concerns Assist level: Moderate assist (Pt 50 - 74%) Walk 50 feet with 2 turns activity did not occur: Safety/medical concerns Walk 150 feet activity did not occur: Safety/medical concerns Walk 10 feet on uneven surfaces activity did not occur: Safety/medical concerns  Function - Comprehension Comprehension: Auditory Comprehension assist level: Follows basic conversation/direction with no assist  Function - Expression Expression: Verbal Expression assist level: Expresses basic needs/ideas: With no assist  Function - Social Interaction Social Interaction assist level: Interacts appropriately 75 - 89% of the time - Needs redirection for appropriate language or to initiate interaction.  Function - Problem Solving Problem solving assist level: Solves basic 50 - 74% of the time/requires cueing 25 - 49% of the time  Function - Memory Memory assist level: Recognizes or recalls 75 - 89% of the time/requires cueing 10 -  24% of the time Patient normally able to recall (first 3 days only): Current season, That he or she is in a hospital, Staff names and faces  Medical Problem List and Plan: 1.  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  .             -continue PT, OT, SLP  -ELOS 01/28/16,  -family ed    2.  DVT  Prophylaxis/Anticoagulation: Subcutaneous heparin for DVT prophylaxis 3. Pain Management/chronic back pain: Oxycodone 5 mg every 6 hours as needed  -continue baclofen prn for cramps/spasms.    -Left hand and wrist pain improved with splint        4. Mood: Prozac 40 mg daily.  Abilify 5 mg daily 5. Neuropsych: This patient is capable of making decisions on his own behalf. ritalin 10mg  BID, appreciate Neuropsych eval - staff will need to limit amt of info presented to pt at one time      6. Skin/Wound Care: Routine skin checks 7. Fluids/Electrolytes/Nutrition: appreciate dietary note 8.H/O  Tracheostomy Decannulated 12/25/2015--healed 9. Gastrostomy PEG tube 12/05/2015. Nocturnal tube feeds recently discontinued. Currently on dysphagia #3/thin.              -PEG d/ced with traction removal minimal bleeding. Dry dressing 10. Hypertension.     -continue amlodipine 5 mg daily, lisinopril 30 mg daily with hold parameters Some fluctuations/lability, cont current meds Vitals:   01/27/16 0356 01/27/16 0826  BP: 129/72 133/75  Pulse: (!) 43   Resp: 18   Temp: 97.4 F (36.3 C)    11. Diabetes mellitus.  Intake is good  Check blood sugars before meals and at bedtime  Fair control at present.  increase lantus  to 33 units,tighten control  Recent Labs  01/26/16 1641 01/26/16 2109 01/27/16 0624  GLUCAP 172* 168* 104*   12. Hyperlipidemia. Lipitor 80 mg daily at bedtime 13.Tobacco abuse. Counseling as appropriate 14.  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 ?RLS- pt c/o symptoms BLE prior to CVA   -low dose requip remains effective, will add trazodone for sleep    LOS (Days) 31 A FACE TO FACE EVALUATION WAS PERFORMED  Johnell Bas  T 01/27/2016, 9:36 AM

## 2016-01-27 NOTE — Plan of Care (Signed)
Problem: RH Awareness Goal: LTG: Patient will demonstrate intellectual/emergent (SLP) LTG: Patient will demonstrate intellectual/emergent/anticipatory awareness with assist during a cognitive/linguistic activity  (SLP)  Outcome: Not Met (add Reason) Pt needs mod assist to recognize and correct errors in the moment

## 2016-01-27 NOTE — Progress Notes (Signed)
Speech Language Pathology Discharge Summary  Patient Details  Name: James Holden MRN: [id number] Date of Birth: 17-Aug-1960  Today's Date: 01/27/2016 SLP Individual Time: 0803-0900 SLP Individual Time Calculation (min): 57 min    Skilled Therapeutic Interventions:  Pt was seen for skilled ST targeting grad day activities and completion of family education prior to discharge home tomorrow.  Pt's wife was present for duration of today's therapy session and remained actively engaged in all training.  SLP reviewed and reinforced distraction management techniques, memory and organizational compensatory strategies, and activities for cognitive remediation and compensation.  Handouts were provided to maximize carryover in the home environment.  SLP also administered portions of the MoCA to measure progress from initial evaluation with pt demonstrating moderate deficits in selective attention as well as visuospatial/constructional and executive functioning subtests.  Discussed recommendations for ongoing ST interventions at next level of care.  Both pt and wife agreeable.  All questions were answered to their satisfaction at this time.  Pt is ready for discharge tomorrow.      Patient has met 4 of 5 long term goals.  [facility] level.  Reasons goals not met:     Clinical Impression/Discharge Summary:  Pt has made functional gains while inpatient and is discharging having met 4 out of 5 long term goals.  Pt's diet has been upgraded to Regular textures and thin liquids which he is consuming with mod I use of swallowing precautions following tray set up.  Pt requires heavy min assist during basic cognitive tasks due to moderately severe impairments s/p R brain dysfunction characterized by decreased selective attention to tasks, decreased emergent awareness of deficits, left inattention, and decreased functional problem solving.  Pt and family education is complete.  Pt is  discharging home with 24/7 supervision from his family.  Pt would benefit from ongoing ST interventions at next level of care to continue to address cognitive function.     Care Partner:  Caregiver Able to Provide Assistance: Yes  Type of Caregiver Assistance: Physical;Cognitive  Recommendation:  Home Health SLP;24 hour supervision/assistance;Outpatient SLP  Rationale for SLP Follow Up: Maximize cognitive function and independence;Reduce caregiver burden   Equipment: none recommended by SLP    Reasons for discharge: Discharged from hospital   Patient/Family Agrees with Progress Made and Goals Achieved: Yes   Function:  Eating Eating   Modified Consistency Diet: No Eating Assist Level: Set up assist for   Eating Set Up Assist For: Opening containers       Cognition Comprehension Comprehension assist level: Follows basic conversation/direction with no assist (Simultaneous filing. User may not have seen previous data.)  Expression   Expression assist level: Expresses basic needs/ideas: With no assist (Simultaneous filing. User may not have seen previous data.)  Social Interaction Social Interaction assist level: Interacts appropriately 90% of the time - Needs monitoring or encouragement for participation or interaction. (Simultaneous filing. User may not have seen previous data.)  Problem Solving Problem solving assist level: Solves basic 50 - 74% of the time/requires cueing 25 - 49% of the time (Simultaneous filing. User may not have seen previous data.)  Memory Memory assist level: Recognizes or recalls 90% of the time/requires cueing < 10% of the time (Simultaneous filing. User may not have seen previous data.)   Jeovany Huitron, Selinda Orion 01/27/2016, 12:30 PM

## 2016-01-27 NOTE — Progress Notes (Signed)
Physical Therapy Note  Patient Details  Name: James Holden MRN: [id number] Date of Birth: 09/28/1960 Today's Date: 01/27/2016  1105-1200, 55 min individual tx Pain: 4/10 L foot  [place]. Family ed with wife James BasqueBecky for basic transfers, bed mobility, donning/doffing L AFO with fabric T strap, w/c parts mgt, L neglect/inattetnion, positioning in w/c.  Wife stated that pt sleeps on R side of bed.  Wife needed physical assist for safe transfers, and needs more training for bed mobility, basic and car transfers, LAFO, w/c mgt on ramp.   Pt still tender 3rd metatarsal L foot.  Gait with PT x 10' with mod assist, LAFO, HW, max cues for sequencing.  Pt limited by L foot pain. James Holden observed for assessment of LAFO with T strap vs without Tstrap.  James Holden took L shoe to modifiy with toe cap.  Wife understands that pt is to ambulate only with therapists at this point. Pt left resting in w/c with quick release belt applied and all needs within reach; wife present.  She plans to return at 8:00am tomorrow for further ed.    James Holden 01/27/2016, 11:38 AM

## 2016-01-27 NOTE — Discharge Summary (Signed)
Discharge summary job # [id number]

## 2016-01-27 NOTE — Discharge Summary (Signed)
NAMMarton Holden:  Holden, James                 ACCOUNT NO.:  [id number]  MEDICAL RECORD NO.:  [id number]  LOCATION:  [id number]                        FACILITY:  [facility]  PHYSICIAN:  Erick ColaceAndrew E. Kirsteins, M.D.DATE OF BIRTH:  1960-09-05  DATE OF ADMISSION:  12/27/2015 DATE OF DISCHARGE:  01/28/2016                              DISCHARGE SUMMARY   DISCHARGE DIAGNOSES: 1. Right middle cerebral artery infarct with craniotomy, November 15, 2015. 2. Subcutaneous heparin for deep venous thrombosis prophylaxis. 3. Pain management. 4. Depression. 5. History of tracheostomy, decannulated, December 25, 2015. 6. Gastrostomy, PEG tube, December 05, 2015 - removed. 7. Hypertension. 8. Diabetes mellitus. 9. Hyperlipidemia. 10.Tobacco abuse.  HISTORY OF PRESENT ILLNESS:  This is a 55 year old right-handed Caucasian male history of diabetes mellitus, tobacco abuse, chronic low back pain.  [facility]. Employed as a Naval architecttruck driver.  Independent prior to admission.  [facility], with large right MCA infarct.  He did receive tPA.  Shortly after tPA, transferred to Interventional Radiology, where they performed embolectomy for findings of occlusion identified on CTA that was done successfully.  He was transferred to ICU, placed on Cardene drip for hypertensive emergency. Postoperatively, complicated by aspiration pneumonia, requiring intubation, mechanical ventilation.  Noted cerebral edema requiring hemicraniotomy, November 15, 2015.  Started on hypertonic saline therapy for cerebral edema.  The patient with recurrent aspirations, several extubations, subsequent re-intubation, tracheostomy performed December 03, 2015, as well as gastrostomy tube placed, December 05, 2015. He was admitted to [facility] December 11, 2015, decannulated December 25, 2015.  Diet slowly advanced to dysphagia #2 nectar-thick liquids.   Maintained on subcutaneous heparin for DVT prophylaxis.  He is requiring max assist for bed mobility and transfers. He was wearing a safety helmet when out of bed after recent craniotomy. The patient was admitted for comprehensive rehab program.  PAST MEDICAL HISTORY:  See discharge diagnoses.  SOCIAL HISTORY:  Lives with spouse, independent prior to admission, working as a Naval architecttruck driver.  Functional status upon admission to rehab services was max assist, bed mobility and transfers.  Total assist, ambulation, max assist for eating and drinking.  PHYSICAL EXAMINATION:  VITAL SIGNS:  Blood pressure 128/77, pulse 80, respirations 18, and temperature 98. GENERAL:  This was an alert male, followed simple commands.  Limited awareness and insight, but oriented to name and place.  [place] LUNGS:  Decreased breath sounds.  Clear to auscultation. CARDIAC:  Regular rate and rhythm.  No murmur. ABDOMEN:  Soft, nontender.  Good bowel sounds.  PEG tube in place. NEUROLOGIC:  Dense left-sided weakness.  REHABILITATION HOSPITAL COURSE:  The patient was admitted to inpatient rehab services with therapies initiated on a 3-hour daily basis, consisting of physical therapy, occupational therapy, speech therapy, and rehabilitation nursing.  The following issues were addressed during the patient's rehabilitation stay.  Pertaining to Mr. James Holden's right MCA infarct with craniotomy November 15, 2015, remained stable.  He would follow up with Neurosurgery, [place]. Dorinda [place]George [place]obrial[place].  He was wearing a safety helmet when out of bed.  He had been started on subcutaneous heparin for DVT prophylaxis.  No bleeding episodes.  Pain management with the use of baclofen as well as oxycodone.  He continued on Prozac, Abilify for depression.  Emotional support provided.  Ritalin had also been initiated to help the patient sustain ability to attend tasks with good results.   Tracheostomy decannulated.  Oxygen saturations greater than 90% on room air.  His diet had been advanced to a regular consistency.  His PEG tube has since been removed.  Blood pressure is controlled on Norvasc as well as lisinopril.  Noted diabetes mellitus, peripheral neuropathy.  Blood sugars overall were good.  He remained on Lantus insulin with full diabetic teaching.  Noted history of hyperlipidemia, continued on Lipitor.  Tobacco abuse.  Received counts in regard to cessation of nicotine products.  The patient received weekly collaborative interdisciplinary team conferences to discuss estimated length of stay, family teaching, any barriers to his discharge.  The patient educated with family on safety of car transfers, squat pivot transfers, sit to stand with hemi walker, minimal assist provided for transfers with some cueing.  Supervision bed mobility transfers, ambulating 21 feet moderate assist with cuing using assistive device.  Left AFO brace in place.  Activities of daily living and homemaking.  Working with dressing, grooming, and hygiene, needing overall assistance due to MCA infarct.  The patient was able to communicate his needs.  Again needing cueing for his safety awareness. Full family teaching was completed and plan discharge to home.  DISCHARGE MEDICATIONS: 1. Norvasc 10 mg p.o. daily. 2. Abilify 5 mg p.o. daily. 3. Aspirin 325 mg p.o. daily. 4. Lipitor 80 mg p.o. daily. 5. Baclofen 5 mg p.o. q.i.d. 6. Pepcid 20 mg p.o. b.i.d. 7. Prozac 40 mg p.o. daily. 8. Lantus insulin 33 units at bedtime. 9. Lisinopril 30 mg p.o. daily. 10.Ritalin 10 mg p.o. b.i.d. 11.Multivitamin daily. 12.MiraLAX daily, hold for loose stool. 13.Requip 0.25 mg p.o. at bedtime. 14.Oxycodone immediate release 5 mg p.o. every 6 hours as needed for     pain, dispense of 20 tablets.  DIET:  His diet was regular.  FOLLOWUP:  The patient would follow up Dr. Claudette LawsAndrew Kirsteins at the outpatient  rehab center as directed; Dr. Dorinda HillGeorge Ghobrial, Neurosurgery, call for appointment; case management to arrange for followup PCP.     Mariam Dollaraniel Shai Rasmussen, P.A.   ______________________________ Erick ColaceAndrew E. Kirsteins, M.D.    DA/MEDQ  D:  01/27/2016  T:  01/27/2016  Job:  [id number]  cc:   Erick ColaceAndrew E. Kirsteins, M.D. Dorinda HillGeorge Ghobrial, MD

## 2016-01-27 NOTE — Progress Notes (Signed)
Occupational Therapy Session Note  Patient Details  Name: Janey GentaJarvis George Monforte MRN: [id number] Date of Birth: 01/21/1961  Today's Date: 01/27/2016 OT Individual Time: 4098-11910917-1032 OT Individual Time Calculation (min): 75 min     Short Term Goals: Week 4:  OT Short Term Goal 1 (Week 4): STGs = LTGs  Skilled Therapeutic Interventions/Progress Updates:    Pt completed bathing and dressing sit to stand at the sink with pt's wife present for education.  Educated her on proper technique for transfer from supine to sit EOB.  She was able to complete squat pivot transfer from the bed to wheelchair X2 with supervision.  Mod instructional cueing for scanning left of midline to locate soap on the sink.  Max hand over hand to incorporate LUE for bathing and for stabilizing washcloth when applying soap.  Mod assist to maintain LLE crossed over right knee for donning brief and pants.  Needs clothing presented in the right visual field for him to locate the left arm opening or the left pants leg.  Mod assist for threading the LLE.  Pt's wife attempted sit to stand X 2 and demonstrated difficulty when helping pt stand to pull pants over hips.  Needs continued practiced with this as well as toilet transfers.  Educated wife on donning and positioning of resting hand splint as well.  Pt was left in wheelchair with spouse present at end of session. Plan for therapy on day of discharge.  Therapy Documentation Precautions:  Precautions Precautions: Fall Precaution Comments: L hemiplegia, R hemicraniotomy  Required Braces or Orthoses: Other Brace/Splint Other Brace/Splint: Helmet when OOB, PRAFO on left foot Restrictions Weight Bearing Restrictions: No  Pain: Pain Assessment Pain Assessment: No/denies pain ADL: See Function Navigator for Current Functional Status.   Therapy/Group: Individual Therapy  Parks Czajkowski 01/27/2016, 12:26 PM

## 2016-01-28 ENCOUNTER — Ambulatory Visit (HOSPITAL_COMMUNITY): Payer: BLUE CROSS/BLUE SHIELD

## 2016-01-28 ENCOUNTER — Inpatient Hospital Stay (HOSPITAL_COMMUNITY): Payer: BLUE CROSS/BLUE SHIELD | Admitting: Occupational Therapy

## 2016-01-28 DIAGNOSIS — G8114 Spastic hemiplegia affecting left nondominant side: Secondary | ICD-10-CM

## 2016-01-28 LAB — GLUCOSE, CAPILLARY
GLUCOSE-CAPILLARY: 139 mg/dL — AB (ref 65–99)
GLUCOSE-CAPILLARY: 139 mg/dL — AB (ref 65–99)

## 2016-01-28 MED ORDER — ROPINIROLE HCL 0.25 MG PO TABS: 0.2500 mg | ORAL_TABLET | Freq: Every day | ORAL | 0 refills | Status: AC

## 2016-01-28 MED ORDER — AMLODIPINE BESYLATE 10 MG PO TABS: 10.0000 mg | ORAL_TABLET | Freq: Every day | ORAL | 1 refills | Status: AC

## 2016-01-28 MED ORDER — BACLOFEN 10 MG PO TABS: 5.0000 mg | ORAL_TABLET | Freq: Four times a day (QID) | ORAL | 1 refills | Status: AC

## 2016-01-28 MED ORDER — ARIPIPRAZOLE 5 MG PO TABS: 5.0000 mg | ORAL_TABLET | Freq: Every day | ORAL | 1 refills | Status: AC

## 2016-01-28 MED ORDER — ASPIRIN 325 MG PO TABS: 325.0000 mg | ORAL_TABLET | Freq: Every day | ORAL | Status: AC

## 2016-01-28 MED ORDER — POLYETHYLENE GLYCOL 3350 17 G PO PACK: 17.0000 g | PACK | Freq: Every day | ORAL | 0 refills | Status: AC

## 2016-01-28 MED ORDER — LISINOPRIL 30 MG PO TABS: 30.0000 mg | ORAL_TABLET | Freq: Every day | ORAL | 1 refills | Status: AC

## 2016-01-28 MED ORDER — INSULIN GLARGINE 100 UNIT/ML ~~LOC~~ SOLN: 33.0000 [IU] | Freq: Every day | SUBCUTANEOUS | 11 refills | Status: AC

## 2016-01-28 MED ORDER — ATORVASTATIN CALCIUM 80 MG PO TABS: 80.0000 mg | ORAL_TABLET | Freq: Every day | ORAL | 1 refills | Status: AC

## 2016-01-28 MED ORDER — METHYLPHENIDATE HCL 10 MG PO TABS: 10.0000 mg | ORAL_TABLET | Freq: Two times a day (BID) | ORAL | 0 refills | Status: AC

## 2016-01-28 MED ORDER — ADULT MULTIVITAMIN W/MINERALS CH: 1.0000 | ORAL_TABLET | Freq: Every day | ORAL | Status: AC

## 2016-01-28 MED ORDER — FLUOXETINE HCL 40 MG PO CAPS: 40.0000 mg | ORAL_CAPSULE | Freq: Every day | ORAL | 3 refills | Status: AC

## 2016-01-28 MED ORDER — FAMOTIDINE 20 MG PO TABS: 20.0000 mg | ORAL_TABLET | Freq: Two times a day (BID) | ORAL | 1 refills | Status: AC

## 2016-01-28 MED ORDER — OXYCODONE HCL 5 MG PO TABS: 5.0000 mg | ORAL_TABLET | Freq: Four times a day (QID) | ORAL | 0 refills | Status: AC | PRN

## 2016-01-28 NOTE — Progress Notes (Signed)
Subjective/Complaints: Aware of D/C, no new c/os  ROS: pt denies nausea, vomiting, diarrhea, cough, shortness of breath or chest pain   Objective: Vital Signs: Blood pressure 118/60, pulse (!) 39, temperature 98.9 F (37.2 C), temperature source Axillary, resp. rate 20, height 5\' 10"  (1.778 m), weight 89.8 kg (197 lb 15.6 oz), SpO2 97 %. No results found. Results for orders placed or performed during the hospital encounter of 12/27/15 (from the past 72 hour(s))  Glucose, capillary     Status: Abnormal   Collection Time: 01/25/16 11:59 AM  Result Value Ref Range   Glucose-Capillary 180 (H) 65 - 99 mg/dL  Glucose, capillary     Status: Abnormal   Collection Time: 01/25/16  4:59 PM  Result Value Ref Range   Glucose-Capillary 171 (H) 65 - 99 mg/dL  Glucose, capillary     Status: Abnormal   Collection Time: 01/25/16  9:18 PM  Result Value Ref Range   Glucose-Capillary 145 (H) 65 - 99 mg/dL  Glucose, capillary     Status: Abnormal   Collection Time: 01/26/16  6:42 AM  Result Value Ref Range   Glucose-Capillary 142 (H) 65 - 99 mg/dL  Glucose, capillary     Status: Abnormal   Collection Time: 01/26/16 11:39 AM  Result Value Ref Range   Glucose-Capillary 165 (H) 65 - 99 mg/dL  Glucose, capillary     Status: Abnormal   Collection Time: 01/26/16  4:41 PM  Result Value Ref Range   Glucose-Capillary 172 (H) 65 - 99 mg/dL  Glucose, capillary     Status: Abnormal   Collection Time: 01/26/16  9:09 PM  Result Value Ref Range   Glucose-Capillary 168 (H) 65 - 99 mg/dL  Glucose, capillary     Status: Abnormal   Collection Time: 01/27/16  6:24 AM  Result Value Ref Range   Glucose-Capillary 104 (H) 65 - 99 mg/dL  Glucose, capillary     Status: Abnormal   Collection Time: 01/27/16 11:58 AM  Result Value Ref Range   Glucose-Capillary 148 (H) 65 - 99 mg/dL  Glucose, capillary     Status: Abnormal   Collection Time: 01/27/16  5:15 PM  Result Value Ref Range   Glucose-Capillary 165 (H) 65  - 99 mg/dL  Glucose, capillary     Status: Abnormal   Collection Time: 01/27/16  8:42 PM  Result Value Ref Range   Glucose-Capillary 146 (H) 65 - 99 mg/dL  Glucose, capillary     Status: Abnormal   Collection Time: 01/28/16  6:24 AM  Result Value Ref Range   Glucose-Capillary 139 (H) 65 - 99 mg/dL     Physical Exam: General: NAD ENT- former trach site CDI,  Psych: Mood and affect are appropriate Head: craniectomy site without swelling noted Heart: RRR no jvd Lungs: clear with reg effort Abdomen: +BS, soft.former PEG NT , bandage Musc: no edema. No tenderness. Skin: skin intact. Neurologic: left inattention and field cut Motor strength is 5/5 in Right  deltoid, bicep, tricep, grip, hip flexor, knee extensors, ankle dorsiflexor and plantar flexor 2-/5 Left hip flexion  LUE: 0/5 prox to distal.  Sensation diminished to light touch LUE and LLE, reduce proprioception as well LE/UE  Assessment/Plan: 1. Functional deficits secondary to RIght MCA infarct with left hemiplegia Stable for D/C today F/u PCP in 3-4 weeks F/u PM&R 2 weeks F/u Neurosurgery 1-2 mo See D/C summary See D/C instructionsFIM: Function - Bathing Position: Wheelchair/chair at sink Body parts bathed by patient: Left arm, Chest,  Abdomen, Right upper leg, Left upper leg, Back, Front perineal area Body parts bathed by helper: Right lower leg, Left lower leg, Buttocks, Back Bathing not applicable: Front perineal area, Buttocks (did not attempt this session) Assist Level: Touching or steadying assistance(Pt > 75%)  Function- Upper Body Dressing/Undressing What is the patient wearing?: Pull over shirt/dress Pull over shirt/dress - Perfomed by patient: Thread/unthread right sleeve, Put head through opening, Pull shirt over trunk Pull over shirt/dress - Perfomed by helper: Thread/unthread left sleeve Assist Level: Touching or steadying assistance(Pt > 75%) Function - Lower Body Dressing/Undressing What is the patient  wearing?: Pants, Faythe Ding[place], Shoes Position: Research officer, trade union at [employer]- Performed by patient: Thread/unthread right pants leg Pants- Performed by helper: Thread/unthread left pants leg, Pull pants up/down Non-skid slipper socks- Performed by helper: Don/doff right sock Socks - Performed by patient: Don/doff right sock, Don/doff left sock Socks - Performed by helper: Don/doff right sock, Don/doff left sock Shoes - Performed by patient: Don/doff right shoe Shoes - Performed by helper: Don/doff left shoe TED Hose - Performed by patient: Don/doff right TED hose, Don/doff left TED hose TED Hose - Performed by helper: Don/doff right TED hose, Don/doff left TED hose Assist for footwear: Partial/moderate assist Assist for lower body dressing: Touching or steadying assistance (Pt > 75%)  Function - Toileting Toileting activity did not occur: Safety/medical concerns Toileting steps completed by helper: Adjust clothing prior to toileting, Performs perineal hygiene, Adjust clothing after toileting Toileting Assistive Devices: Grab bar or rail, Prosthesis/orthosis Assist level: Touching or steadying assistance (Pt.75%)  Function - Archivist transfer activity did not occur: Safety/medical concerns Toilet transfer assistive device: Mechanical lift Mechanical lift: Huntley Dec Assist level to toilet: 2 helpers Assist level from toilet: 2 helpers  Function - Chair/bed transfer Chair/bed transfer method: Squat pivot Chair/bed transfer assist level: Moderate assist (Pt 50 - 74%/lift or lower) Chair/bed transfer assistive device: Armrests Mechanical lift: Stedy Chair/bed transfer details: Verbal cues for technique, Tactile cues for weight shifting, Tactile cues for sequencing, Verbal cues for sequencing, Manual facilitation for weight shifting  Function - Locomotion: Wheelchair Will patient use wheelchair at discharge?: Yes Type: Manual Max wheelchair distance: 30 Assist Level:  Supervision or verbal cues Assist Level: Supervision or verbal cues Wheel 150 feet activity did not occur: Safety/medical concerns Assist Level: Supervision or verbal cues Turns around,maneuvers to table,bed, and toilet,negotiates 3% grade,maneuvers on rugs and over doorsills: No Function - Locomotion: Ambulation Ambulation activity did not occur: Safety/medical concerns Assistive device: Walker-hemi, Orthosis Max distance: 10 Assist level: Moderate assist (Pt 50 - 74%) Walk 10 feet activity did not occur: Safety/medical concerns Assist level: Moderate assist (Pt 50 - 74%) Walk 50 feet with 2 turns activity did not occur: Safety/medical concerns (pain L foot) Walk 150 feet activity did not occur: Safety/medical concerns Walk 10 feet on uneven surfaces activity did not occur: Safety/medical concerns  Function - Comprehension Comprehension: Auditory Comprehension assist level: Follows basic conversation/direction with no assist  Function - Expression Expression: Verbal Expression assist level: Expresses basic needs/ideas: With no assist  Function - Social Interaction Social Interaction assist level: Interacts appropriately 75 - 89% of the time - Needs redirection for appropriate language or to initiate interaction.  Function - Problem Solving Problem solving assist level: Solves basic 50 - 74% of the time/requires cueing 25 - 49% of the time  Function - Memory Memory assist level: Recognizes or recalls 25 - 49% of the time/requires cueing 50 - 75% of the time Patient normally  able to recall (first 3 days only): Current season, That he or she is in a hospital, Staff names and faces  Medical Problem List and Plan: 1.  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  .             -continue PT, OT, SLP  -plan D/C home today    2.  DVT Prophylaxis/Anticoagulation: Subcutaneous heparin for DVT prophylaxis 3. Pain Management/chronic back pain:  Oxycodone 5 mg every 6 hours as needed  -continue baclofen prn for cramps/spasms.    -Left hand and wrist pain improved with splint        4. Mood: Prozac 40 mg daily.  Abilify 5 mg daily 5. Neuropsych: This patient is capable of making decisions on his own behalf. ritalin 10mg  BID, appreciate Neuropsych eval - staff will need to limit amt of info presented to pt at one time      6. Skin/Wound Care: Routine skin checks 7. Fluids/Electrolytes/Nutrition: appreciate dietary note 8.H/O  Tracheostomy Decannulated 12/25/2015--healed 9. Gastrostomy PEG tube 12/05/2015. Nocturnal tube feeds recently discontinued. Currently on dysphagia #3/thin.              -PEG d/ced with traction removal minimal bleeding. Dry dressing 10. Hypertension.     -continue amlodipine 5 mg daily, lisinopril 30 mg daily with hold parameters Some fluctuations/lability, cont current meds Vitals:   01/27/16 1517 01/28/16 0500  BP: (!) 141/53 118/60  Pulse: (!) 52 (!) 39  Resp: 18 20  Temp: 98.1 F (36.7 C) 98.9 F (37.2 C)   11. Diabetes mellitus.  Intake is good  Check blood sugars before meals and at bedtime  Good control at present.   Recent Labs  01/27/16 1715 01/27/16 2042 01/28/16 0624  GLUCAP 165* 146* 139*   12. Hyperlipidemia. Lipitor 80 mg daily at bedtime 13.Tobacco abuse. Counseling as appropriate 14.  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 ?RLS- pt c/o symptoms BLE prior to CVA   -low dose requip remains effective, will add trazodone for sleep    LOS (Days) 32 A FACE TO FACE EVALUATION WAS PERFORMED  KIRSTEINS,ANDREW E 01/28/2016, 7:26 AM

## 2016-01-28 NOTE — Progress Notes (Signed)
Patient discharged to home accompanied by his wife and son. 

## 2016-01-28 NOTE — Progress Notes (Signed)
Social Work  Discharge Note  The overall goal for the admission was met for:   Discharge location: Yes-HOME WITH WIFE AND SON ASSISTING-24 HR  Length of Stay: Yes-32 DAYS  Discharge activity level: Yes-MIN-MOD LEVEL  Home/community participation: Yes  Services provided included: MD, RD, PT, OT, SLP, RN, CM, TR, Pharmacy, Neuropsych and SW  Financial Services: Private Insurance: [place]  Follow-up services arranged: Home Health: [facility]-PT,OT,RN,SP,AIDE,SW, DME: [facility]-WHEELCHAIR, WIDE DROP-ARM BEDSIDE COMMODE, 30 TRANSFER BOARD, [facility] and Patient/Family has no preference for HH/DME agencies  Comments (or additional information):WIFE HERE YESTERDAY AND TODAY TO LEARN PT'S CARE. SHE IS TAKING OFF ONE WEEK BUT THEN UNSURE WHO WILL BE PROVIDING CARE TO PT WHILE SHE IS WORKING. AWARE HE WILL NEED 24 HR PHYSICAL CARE. SHE IS DEPENDING UPON [employer] TO COVER A CAREGIVER FOR HER-HAS A INSURANCE CASE MANAGER AND WILL WORK WITH HER ON THIS. NOT REALISTIC REGARDING PT'S CARE AND LEVELS. GAVE WIFE A PRIVATE DUTY LIST IN CASE SHE NEEDS TO HIRE A CAREGIVER FOR PT. SHE FEELS EVERYTHING IS FINE AND WAITING FOR SON TO GE THERE WITH HIS CAR TO TAKE PT HOME. GAVE HER THE INSURANCE DISABILITY INFORMATION BACK SINCE FAXED IT. SHE WILL FOLLOW UP WITH IT.  Patient/Family verbalized understanding of follow-up arrangements: Yes  Individual responsible for coordination of the follow-up plan: [name]-WIFE  Confirmed correct DME delivered: Elease Hashimoto 01/28/2016    Elease Hashimoto

## 2016-01-28 NOTE — Progress Notes (Signed)
Occupational Therapy Discharge Summary  Patient Details  Name: James Holden MRN: [id number] Date of Birth: 01-29-1961  Today's Date: 01/28/2016 OT Individual Time: 0800-0900 OT Individual Time Calculation (min): 60 min   Session Note:  Pt's wife in for family education.  Had her assist with bed mobility to the left and right as well as transition to sitting from the left side.  Practiced squat pivot transfers to the left and right to the wheelchair and to the drop arm commode.  Also worked on sit to stand for Probation officer during toileting.  Recommended spouse use bed pan at this time as she cannot consistently transfer him to the drop arm commode safely and complete toilet hygiene and clothing management without a second person as well as continued practice.  Both pt and spouse agree that this is the safest thing at this time.  Recommended use of bed pads and mattress cover at home as well.  Pt left in wheelchair at end of session with PT present for next session.    Patient has met 8 of 13 long term goals due to improved activity tolerance, improved balance, postural control, ability to compensate for deficits, functional use of  LEFT upper and LEFT lower extremity, improved attention and improved awareness.  Patient to discharge at overall Mod Assist level.  Patient's care partner requires assistance to provide the necessary physical and cognitive assistance at discharge.    Reasons goals not met: Pt needs mod assist for toileting and toilet transfers as well as toileting and LB selfcare sit to stand.    Recommendation:  Patient will benefit from ongoing skilled OT services in home health setting to continue to advance functional skills in the area of BADL and Reduce care partner burden.  Pt still demonstrates significant left neglect with left visual field deficit as well as left hemiparesis.  He is still limited by pain in the left shoulder, and hand as well as the left foot.  Tone  continues to increase which can also be a barrier for future function.  Recommend continued HHOT to further progress ADL function, increase competency with functional transfers and selfcare with family, and increase pt's overall well being.  Recommend continued 24 hour assistance from family for safety.    Equipment: drop arm commode, wheelchair  Reasons for discharge: treatment goals met and discharge from hospital  Patient/family agrees with progress made and goals achieved: Yes  OT Discharge Precautions/Restrictions Precautions Precautions: Fall Precaution Comments: L hemiplegia, R hemicraniotomy  Required Braces or Orthoses: Other Brace/Splint Other Brace/Splint: Helmet when OOB, PRAFO on left foot Restrictions Weight Bearing Restrictions: No  Pain Pain Assessment Pain Assessment: Faces Faces Pain Scale: Hurts a little bit Pain Type: Acute pain Pain Location: Shoulder Pain Orientation: Left Pain Descriptors / Indicators: Discomfort Pain Intervention(s): Repositioned ADL ADL ADL Comments: Please see functional navigator Vision/Perception  Vision- History Baseline Vision/History: No visual deficits Patient Visual Report: No change from baseline Vision- Assessment Eye Alignment: Impaired (comment) (eye gaze to the right bilaterally) Ocular Range of Motion: Restricted on the left Alignment/Gaze Preference: Head turned;Gaze right Tracking/Visual Pursuits: Requires cues, head turns, or add eye shifts to track (Pt with decreased consistency for tracking across midline to the left side.  With max instructional cueing he will scan to the left and track across midline for brief periods of less than 5 seconds and then his eyes and head shift back to the right. ) Convergence: Impaired (comment) Visual Fields: Left visual field deficit  Cognition Overall Cognitive Status: Impaired/Different from baseline Arousal/Alertness: Awake/alert Orientation Level: Oriented X4 Attention:  Sustained;Selective Focused Attention: Appears intact Sustained Attention: Appears intact Selective Attention: Impaired Memory: Impaired Memory Impairment: Decreased recall of new information;Retrieval deficit Decreased Short Term Memory: Functional basic;Functional complex Awareness Impairment: Anticipatory impairment Problem Solving: Impaired Problem Solving Impairment: Functional basic;Functional complex Sequencing: Impaired Safety/Judgment: Impaired Sensation Sensation Light Touch: Impaired Detail Light Touch Impaired Details: Impaired LUE Stereognosis: Not tested Hot/Cold: Impaired Detail Hot/Cold Impaired Details: Impaired LUE Proprioception: Impaired Detail Proprioception Impaired Details: Impaired LUE Additional Comments: Pt able to detect deep pressure throughout the LUE but not able to determine light touch.   Coordination Gross Motor Movements are Fluid and Coordinated: No Fine Motor Movements are Fluid and Coordinated: No Coordination and Movement Description: Pt currently with only trace movements in the left shoulder.  Needs max hand over hand to integrate the LUE or hand into function at this time.   Motor  Motor Motor: Hemiplegia;Abnormal postural alignment and control Motor - Discharge Observations: Pt still with significant left hemiparesis with the LUE more impaired than the LE at this time.   Mobility  Bed Mobility Bed Mobility: Rolling Left;Rolling Right;Left Sidelying to Sit Rolling Right: 2: Max assist Rolling Right Details: Manual facilitation for weight bearing;Manual facilitation for weight shifting;Tactile cues for placement Rolling Left: 4: Min assist Rolling Left Details: Verbal cues for technique;Manual facilitation for weight shifting;Manual facilitation for placement Left Sidelying to Sit: 3: Mod assist;HOB flat Left Sidelying to Sit Details: Manual facilitation for weight bearing;Manual facilitation for weight shifting;Verbal cues for  technique Transfers Transfers: Sit to Stand;Stand to Sit Sit to Stand: 4: Min assist;With armrests;From chair/3-in-1;With upper extremity assist Sit to Stand Details (indicate cue type and reason): Cueing for flexed trunk with all transitional movements.   Stand to Sit: 4: Min assist;With upper extremity assist;To chair/3-in-1  Trunk/Postural Assessment  Cervical Assessment Cervical Assessment: Exceptions to [facility] (flexed cervical flexion with head rotation to the right.  ) Thoracic Assessment Thoracic Assessment: Exceptions to [facility] (left trunk passive elongation) Lumbar Assessment Lumbar Assessment: Exceptions to [facility] (Posterior pelvic tilt with thoracic rounding as well) Postural Control Head Control: cervical flexion still present with rotation to the right at rest.  Decreased AROM to the left with rotation.  Trunk Control: Passive left trunk elongation with right side shortening.  Sacral sitting with posterior pelvic tilt at rest.   Balance Balance Balance Assessed: Yes Static Sitting Balance Static Sitting - Balance Support: No upper extremity supported;Feet supported Static Sitting - Level of Assistance: 5: Stand by assistance Dynamic Sitting Balance Dynamic Sitting - Balance Support: Right upper extremity supported;Feet supported Dynamic Sitting - Level of Assistance: 5: Stand by assistance Static Standing Balance Static Standing - Balance Support: Right upper extremity supported Static Standing - Level of Assistance: 4: Min assist Dynamic Standing Balance Dynamic Standing - Balance Support: Right upper extremity supported Dynamic Standing - Level of Assistance: 3: Mod assist Extremity/Trunk Assessment RUE Assessment RUE Assessment: Within Functional Limits LUE Assessment LUE Assessment: Exceptions to WFL LUE Tone LUE Tone Comments: Pt with overall Brunnstrum stage II in the left UE and hand with increased tone noted in the finger flexors and wrist at this time.  Trace  shoulder elevation and flexion noted in the right setup with gravity eliminated.  Noted 1 finger inferior anterior subluxation noted as well.  Has resting hand splint for support and wear as well.  PROM in the shoulder 0-110 degrees with increased pain,  Elbow and hand  PROM WFLS with slight pain with wrist extension and digit flexion.    See Function Navigator for Current Functional Status.  Sai Moura OTR/L 01/28/2016, 12:55 PM

## 2016-01-28 NOTE — Progress Notes (Signed)
Patient had some bradycardia this morning when vitals taken by dynamap. Apical pulse was taken & low readings were approximately accurate. James Holden was informed.. Patient asymptomatic.

## 2016-01-28 NOTE — Plan of Care (Signed)
Problem: RH Balance Goal: LTG Patient will maintain dynamic standing with ADLs (OT) LTG:  Patient will maintain dynamic standing balance with assist during activities of daily living (OT)   Outcome: Not Met (add Reason) Needs mod to max assist.  Problem: RH Bathing Goal: LTG Patient will bathe with assist, cues/equipment (OT) LTG: Patient will bathe specified number of body parts with assist with/without cues using equipment (position)  (OT)  Outcome: Not Met (add Reason) Needs mod assist.  Problem: RH Vision Goal: RH LTG Vision (Specify) Outcome: Not Met (add Reason) Needs mod verbal cueing.  Problem: RH Toilet Transfers Goal: LTG Patient will perform toilet transfers w/assist (OT) LTG: Patient will perform toilet transfers with assist, with/without cues using equipment (OT)  Outcome: Not Met (add Reason) Needs mod assist.   Problem: RH Tub/Shower Transfers Goal: LTG Patient will perform tub/shower transfers w/assist (OT) LTG: Patient will perform tub/shower transfers with assist, with/without cues using equipment (OT)  Outcome: Not Met (add Reason) Needs mod assist.    

## 2016-01-28 NOTE — Progress Notes (Signed)
Physical Therapy Session Note  Patient Details  Name: James Holden [place]ona MRN: [id number] Date of Birth: 11/15/1960  Today's Date: 01/28/2016 PT Individual Time: 0905-1000 PT Individual Time Calculation (min): 55 min    Short Term Goals: Week 4:  PT Short Term Goal 1 (Week 4): LTG = STG due to ELOS  Skilled Therapeutic Interventions/Progress Updates:    Session focused on family education with pt and pt's wife reviewing basic transfer techniques (squat pivot) with multiple attempts and cues needed for improved body mechanics and technique and attempting real car transfer to SUV. Discussed challenges of SUV height that was previously discussed by primary PT yesterday during family ed session but pt's wife stated that was the vehicle they had to use and could not always rely on the son's sedan. When attempting real car transfer outside, pt's wife unable to assist patient enough to complete stand pivot and due to gap where seat starts, unable to fully seat on seat safely. Even with a second person, this would not be a safe transfer and pt's wife reports she will not consistently have +2 help. After 2 failed attempts, pt and wife agreeable that this vehicle is not an option for discharge. Recommended slideboard into sedan like they practiced previously in a session. Equipment arrived to room, and PT had pt transfer with mod assist out of hospital w/c and into rented w/c and adjusted legrests. Left pt and wife in room at end of session and notified CSW and NT/RN of transfer recommendation for car. Still awaiting AFO delivery from [employer].     Therapy Documentation Precautions:  Precautions Precautions: Fall Precaution Comments: L hemiplegia, R hemicraniotomy  Required Braces or Orthoses: Other Brace/Splint Other Brace/Splint: Helmet when OOB, PRAFO on left foot Restrictions Weight Bearing Restrictions: No  Pain: No complaints of pain.   See Function Navigator for Current Functional  Status.   Therapy/Group: Individual Therapy  Karolee StampsGray, Rihanna Marseille Darrol PokeBrescia  Latessa Tillis B. Willena Jeancharles, PT, DPT  01/28/2016, 1:17 PM

## 2016-01-28 NOTE — Discharge Instructions (Signed)
Inpatient Rehab Discharge Instructions  [facility] Discharge date and time: No discharge date for patient encounter.   Activities/Precautions/ Functional Status: Activity: Continue with protective helmet Diet: Mechanical soft diabetic diet Wound Care: none needed Functional status:  ___ No restrictions     ___ Walk up steps independently ___ 24/7 supervision/assistance   ___ Walk up steps with assistance ___ Intermittent supervision/assistance  ___ Bathe/dress independently ___ Walk with walker     _x__ Bathe/dress with assistance ___ Walk Independently    ___ Shower independently ___ Walk with assistance    ___ Shower with assistance ___ No alcohol     ___ Return to work/school ________  Special Instructions: No driving smoking or alcohol    COMMUNITY REFERRALS UPON DISCHARGE:    Home Health:   PT, OT,SP, RN,AIDE  Agency:[facility] Phone:[phone]   Date of last service:01/28/2016  Medical Equipment/Items Ordered:WHEELCHAIR, WIDE DROP-ARM BEDSIDE COMMODE, 30 TRANSFER BOARD, [facility]  Agency/Supplier:[facility]   [phone] Other:[facility] APPLICATION PENDING  GENERAL COMMUNITY RESOURCES FOR PATIENT/FAMILY: Support Groups: [facility] SUPPORT GROUP EVERY SECOND Thursday @ 3:00-4:00 PM ON THE REHAB UNIT QUESTIONS CONTACT CAITLYN [phone]   STROKE/TIA DISCHARGE INSTRUCTIONS SMOKING Cigarette smoking nearly doubles your risk of having a stroke & is the single most alterable risk factor  If you smoke or have smoked in the last 12 months, you are advised to quit smoking for your health.  Most of the excess cardiovascular risk related to smoking disappears within a year of stopping.  Ask you doctor about anti-smoking medications  [employer] Quit Line: 1-800-QUIT NOW  Free Smoking Cessation Classes [phone]  CHOLESTEROL Know your levels; limit fat & cholesterol in your diet  Lipid Panel  No results found for: CHOL, TRIG, HDL, CHOLHDL, VLDL, LDLCALC     Many patients benefit from treatment even if their cholesterol is at goal.  Goal: Total Cholesterol (CHOL) less than 160  Goal:  Triglycerides (TRIG) less than 150  Goal:  HDL greater than 40  Goal:  LDL (LDLCALC) less than 100   BLOOD PRESSURE American Stroke Association blood pressure target is less that 120/80 mm/Hg  Your discharge blood pressure is:  BP: (!) 133/53  Monitor your blood pressure  Limit your salt and alcohol intake  Many individuals will require more than one medication for high blood pressure  DIABETES (A1c is a blood sugar average for last 3 months) Goal HGBA1c is under 7% (HBGA1c is blood sugar average for last 3 months)  Diabetes:     No results found for: HGBA1C   Your HGBA1c can be lowered with medications, healthy diet, and exercise.  Check your blood sugar as directed by your physician  Call your physician if you experience unexplained or low blood sugars.  PHYSICAL ACTIVITY/REHABILITATION Goal is 30 minutes at least 4 days per week  Activity: Increase activity slowly, Therapies: Physical Therapy: Home Health Return to work:   Activity decreases your risk of heart attack and stroke and makes your heart stronger.  It helps control your weight and blood pressure; helps you relax and can improve your mood.  Participate in a regular exercise program.  Talk with your doctor about the best form of exercise for you (dancing, walking, swimming, cycling).  DIET/WEIGHT Goal is to maintain a healthy weight  Your discharge diet is: Diet NPO time specified  liquids Your height is:  Height: 5\' 10"  (177.8 cm) Your current weight is: Weight: 90.7 kg (199 lb 15.3 oz) Your Body Mass Index (  BMI) is:  BMI (Calculated): 28.6  Following the type of diet specifically designed for you will help prevent another stroke.  Your goal weight range is:    Your goal Body Mass Index (BMI) is 19-24.  Healthy food habits can help reduce 3 risk factors for stroke:  High  cholesterol, hypertension, and excess weight.  RESOURCES Stroke/Support Group:  Call [phone]   STROKE EDUCATION PROVIDED/REVIEWED AND GIVEN TO PATIENT Stroke warning signs and symptoms How to activate emergency medical system (call 911). Medications prescribed at discharge. Need for follow-up after discharge. Personal risk factors for stroke. Pneumonia vaccine given:  Flu vaccine given:  My questions have been answered, the writing is legible, and I understand these instructions.  I will adhere to these goals & educational materials that have been provided to me after my discharge from the hospital.      My questions have been answered and I understand these instructions. I will adhere to these goals and the provided educational materials after my discharge from the hospital.  Patient/Caregiver Signature _______________________________ Date __________  Clinician Signature _______________________________________ Date __________  Please bring this form and your medication list with you to all your follow-up doctor's appointments.

## 2016-01-28 NOTE — Progress Notes (Signed)
Physical Therapy Session Note  Patient Details  Name: James Holden MRN: [id number] Date of Birth: 12/15/1960  Today's Date: 01/28/2016 PT Individual Time: 1410-1425 PT Individual Time Calculation (min): 15 min    Skilled Therapeutic Interventions/Progress Updates:    Pt seen to finalize family education with pt's son and wife to get into real car at discharge. Cues provided for placement of slideboard, technique, hand placement, and body mechanics. Required +2 assist for safety and recommended for them to do this transfer with 2 people until they can practice more with HHPT. Demonstrated equipment breakdown and use of removable brake extenders with pt, son, and wife.    Therapy Documentation Precautions:  Precautions Precautions: Fall Precaution Comments: L hemiplegia, R hemicraniotomy  Required Braces or Orthoses: Other Brace/Splint Other Brace/Splint: Helmet when OOB, PRAFO on left foot Restrictions Weight Bearing Restrictions: No Pain: No complaints.    See Function Navigator for Current Functional Status.   Therapy/Group: Individual Therapy  Karolee StampsGray, James Holden  James Holden, PT, DPT  01/28/2016, 3:39 PM

## 2016-01-29 NOTE — Progress Notes (Signed)
Physical Therapy Discharge Summary  Patient Details  Name: James Holden MRN: [id number] Date of Birth: 1960/06/20 Late entry 01/29/16 for d/c 01/28/16  -     Patient has met 4 of 10 long term goals due to improved activity tolerance, improved balance, improved postural control, increased strength, ability to compensate for deficits, functional use of  left lower extremity and improved attention.  Patient to discharge at a wheelchair level James Holden.   Patient's care partner requires assistance of her son to provide the necessary physical and cognitive assistance at discharge.  Reasons goals not met: LLE spasticity which increased during stay, and L foot pain in the last few days before d/c.  (Pt started on Baclofen while at [facility]. ) Regarding car transfer, pt's wife was unable to physically assist pt safely and a 2nd person needed to help (her son was trained).  Recommendation:  Patient will benefit from ongoing skilled PT services in home health setting to continue to advance safe functional mobility, address ongoing impairments in L attention, pain, spasticity, mobility, locomotion, and minimize fall risk.  Equipment: rental w/c, L AFO and modified shoe, HW, PRAFO  Reasons for discharge: treatment goals met and discharge from hospital  Patient/family agrees with progress made and goals achieved: Yes  PT Discharge Precautions/RestrictionsPrecautions Precautions: Fall Precaution Comments: L hemiplegia, R hemicraniotomy ; L neglect Required Braces or Orthoses: Other Brace/Splint Other Brace/Splint: Helmet when OOB, PRAFO on left foot Restrictions Weight Bearing Restrictions: No  Pain: none at rest, but L foot painful with wt bearing, unrated.  Improved with PRAFO or AFO.   Vision/Perception  Vision - Assessment Eye Alignment: Impaired (comment) Ocular Range of Motion: Restricted on the left Alignment/Gaze Preference: Head turned;Gaze right Tracking/Visual Pursuits: Requires  cues, head turns, or add eye shifts to track  Cognition Overall Cognitive Status: Impaired/Different from baseline Arousal/Alertness: Awake/alert Orientation Level: Oriented X4 Attention: Sustained;Selective Focused Attention: Appears intact Sustained Attention: Appears intact Selective Attention: Impaired Memory: Impaired Memory Impairment: Decreased recall of new information;Retrieval deficit Decreased Short Term Memory: Functional basic;Functional complex Awareness Impairment: Anticipatory impairment Problem Solving: Impaired Problem Solving Impairment: Functional basic;Functional complex Sequencing: Impaired Safety/Judgment: Impaired Sensation Sensation Light Touch: Impaired Detail Light Touch Impaired Details: Absent LLE;Impaired LLE Proprioception: Impaired by gross assessment Coordination Gross Motor Movements are Fluid and Coordinated: No Fine Motor Movements are Fluid and Coordinated: No Motor  Motor Motor: Hemiplegia;Abnormal postural alignment and control Motor - Discharge Observations: Pt still with significant left hemiparesis with the LUE more impaired than the LE at this time.    Mobility Bed Mobility Bed Mobility: Rolling Left;Rolling Right;Left Sidelying to Sit Rolling Right: 2: Max assist Rolling Right Details: Manual facilitation for weight bearing;Manual facilitation for weight shifting;Tactile cues for placement Rolling Left: 4: Min assist Rolling Left Details: Verbal cues for technique;Manual facilitation for weight shifting;Manual facilitation for placement Left Sidelying to Sit: 3: Mod assist;HOB flat Left Sidelying to Sit Details: Manual facilitation for weight bearing;Manual facilitation for weight shifting;Verbal cues for technique Transfers Sit to Stand: 4: Min assist;With armrests;From chair/3-in-1;With upper extremity assist Stand to Sit: 4: Min assist;With upper extremity assist;To chair/3-in-1 Locomotion  Ambulation Ambulation:  Yes Ambulation/Gait Assistance: 3: Mod assist Ambulation Distance (Feet): 10 Feet Assistive device: Hemi-walker Ambulation/Gait Assistance Details: Manual facilitation for weight shifting;Manual facilitation for placement;Verbal cues for safe use of DME/AE;Verbal cues for technique Gait Gait: Yes Gait Pattern: Impaired Gait Pattern: Narrow base of support;Trunk rotated posteriorly on left;Decreased hip/knee flexion - left;Decreased stance time - left;Decreased dorsiflexion -  left;Decreased weight shift to left Gait velocity: significantly decreased for age and gender Stairs / Additional Locomotion Stairs: No Architect: Yes Wheelchair Assistance: 5: Investment banker, operational Details: Verbal cues for Marketing executive: Right upper extremity;Right lower extremity Wheelchair Parts Management: Needs assistance Distance: 150  Trunk/Postural Assessment  Cervical Assessment Cervical Assessment: Exceptions to [facility] (flexed cervical flexion with head rotation to the right.  ) Thoracic Assessment Thoracic Assessment: Exceptions to [facility] (left trunk passive elongation) Lumbar Assessment Lumbar Assessment: Exceptions to [facility] (Posterior pelvic tilt with thoracic rounding as well) Postural Control Head Control: cervical flexion still present with rotation to the right at rest.  Decreased AROM to the left with rotation.  Trunk Control: Passive left trunk elongation with right side shortening.  Sacral sitting with posterior pelvic tilt at rest.   Balance Balance Balance Assessed: Yes Static Sitting Balance Static Sitting - Balance Support: No upper extremity supported;Feet supported Static Sitting - Level of Assistance: 5: Stand by assistance Dynamic Sitting Balance Dynamic Sitting - Balance Support: Right upper extremity supported;Feet supported Dynamic Sitting - Level of Assistance: 5: Stand by assistance Static Standing Balance Static Standing -  Balance Support: Right upper extremity supported Static Standing - Level of Assistance: 4: Min assist Dynamic Standing Balance Dynamic Standing - Balance Support: Right upper extremity supported Dynamic Standing - Level of Assistance: 3: Mod assist Extremity Assessment      RLE Assessment RLE Assessment: Within Functional Limits LLE Assessment LLE Assessment: Exceptions to [facility] LLE Strength LLE Overall Strength Comments: grossly in sitting: hip flexion 1/5; knee ext 2-/5, knee flex 2-/5,ankle DF 0/5, ankle Pf 1+/5 LLE Tone LLE Tone Comments: strong hip adduction, knee ext, ankle PF and inversion during gait.  foot painful around 3rd met, ? spasticity;  relieved with L PRAFO in bed, or L AFO during wt bearing   See Function Navigator for Current Functional Status.  Akira Adelsberger 01/29/2016, 9:11 PM

## 2016-02-21 DIAGNOSIS — I69354 Hemiplegia and hemiparesis following cerebral infarction affecting left non-dominant side: Secondary | ICD-10-CM | POA: Diagnosis not present

## 2016-02-21 DIAGNOSIS — I69318 Other symptoms and signs involving cognitive functions following cerebral infarction: Secondary | ICD-10-CM | POA: Diagnosis not present

## 2016-02-21 DIAGNOSIS — I69311 Memory deficit following cerebral infarction: Secondary | ICD-10-CM | POA: Diagnosis not present

## 2016-02-21 DIAGNOSIS — I6931 Attention and concentration deficit following cerebral infarction: Secondary | ICD-10-CM | POA: Diagnosis not present

## 2016-02-27 ENCOUNTER — Inpatient Hospital Stay: Payer: BLUE CROSS/BLUE SHIELD | Admitting: Physical Medicine & Rehabilitation

## 2016-02-27 ENCOUNTER — Encounter: Payer: BLUE CROSS/BLUE SHIELD | Attending: Physical Medicine & Rehabilitation

## 2016-03-10 ENCOUNTER — Telehealth: Payer: Self-pay | Admitting: *Deleted

## 2016-03-10 NOTE — Telephone Encounter (Signed)
Physical Therapist called and left a message for verbal orders to continue homehealth PT.  I consulted patients chart and noted that the patient has not been seen in our clinic since hospital discharge on 01/28/2016.  The patient 'no-showed' his appointment on 02/27/2016.  I contacted the Physical Therapist and informed. I told him I would forward request to Dr. Sharion SettlerKisteins for verbal orders. How would you like to proceed?

## 2016-03-10 NOTE — Telephone Encounter (Signed)
I left a voicemail on the physical therapists secured line stating that the patient must be seen for any more orders to be signed

## 2016-03-10 NOTE — Telephone Encounter (Signed)
Since we have not seen the patient post discharge, they will have to call PCP. They can encourage the patient to make an appointment with me in which case I can extend his therapy after I see him.

## 2016-04-06 ENCOUNTER — Telehealth: Payer: Self-pay

## 2016-04-06 NOTE — Telephone Encounter (Signed)
Have patient make an appointment to see me in the office for evaluation

## 2016-04-06 NOTE — Telephone Encounter (Signed)
ritu saraogi OT [facility] called, stated Dorsal Left hand/wrist area still very painful on wrist area and not able to range of motion exercise him.  States refuses pain medication.  States also now has increased spasticity in affected areas

## 2016-04-07 NOTE — Telephone Encounter (Signed)
Mrs James Holden states that [facility] and does not need to be seen.  She was upset about the OT calling us.  I explained to her that Ritu was following protocol since Dr Earl ManyKirseins was the MD who wrote for in home therapy at discharge and she is obligated to report concerns to Dr Wynn BankerKirsteins.  Mrs James Holden insists he is in good hands and does not need to be seen. He is seeing PCP tomorrow.

## 2016-12-20 IMAGING — RF DG SWALLOWING FUNCTION - NRPT [facility]
1 series · 18 of 24 positions shown · non-contrast
Comparison: none

[Series 1: run · 20 acquisitions, 18 frames shown]
[im 1/20]
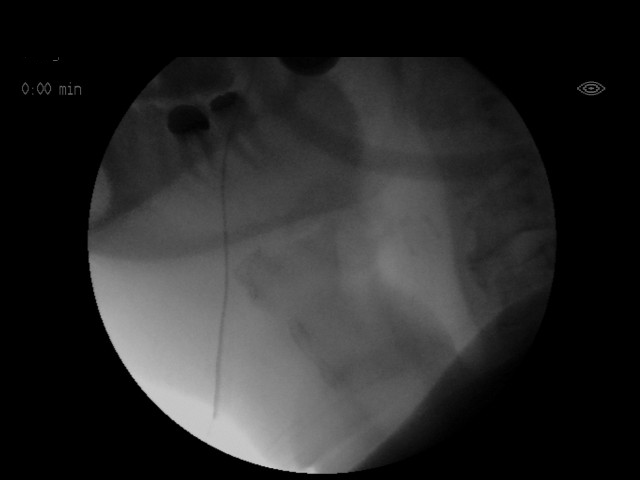
[im 2/20]
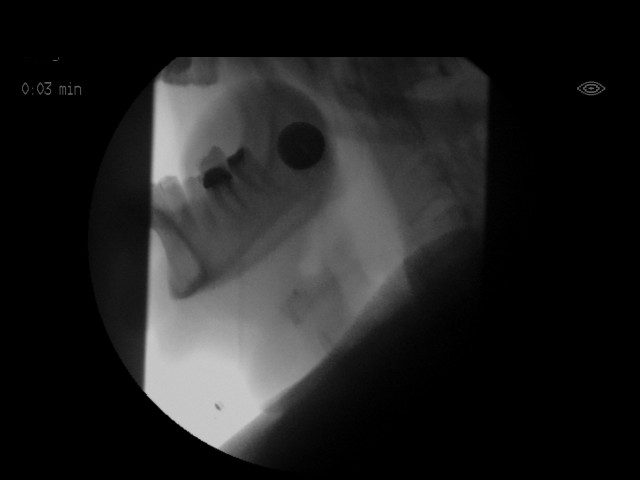
[im 3/20]
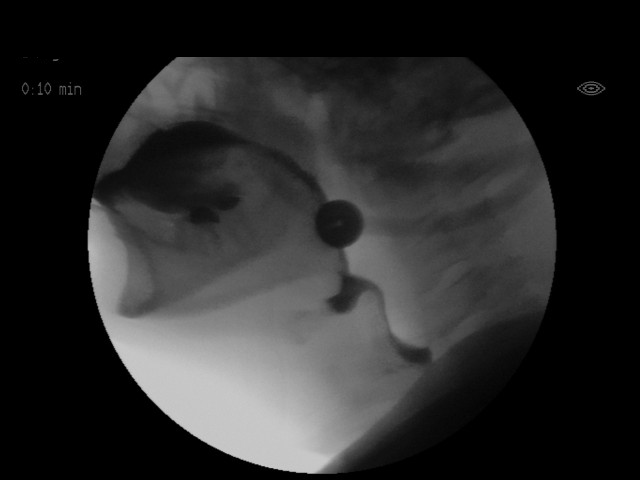
[im 4/20]
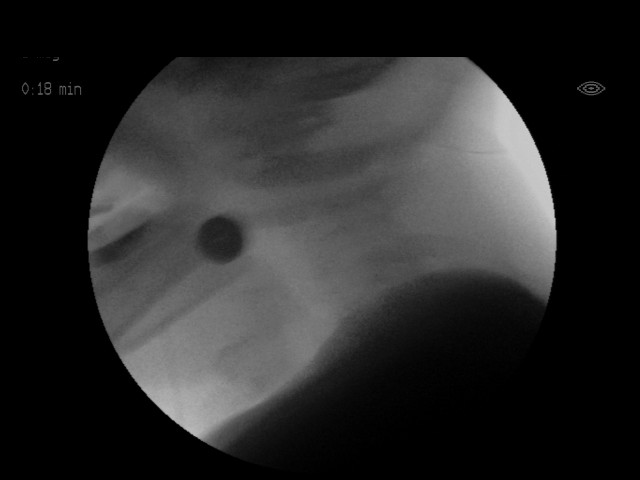
[im 6/20]
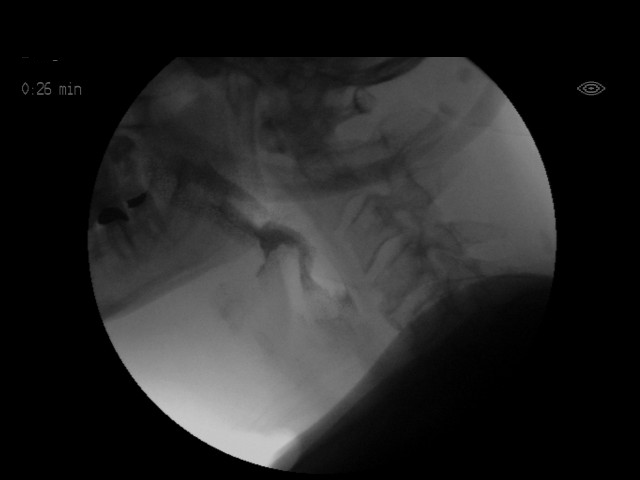
[im 7/20]
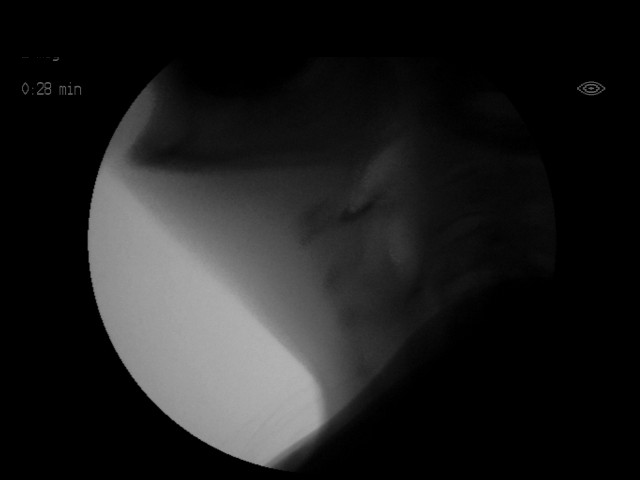
[im 7/20]
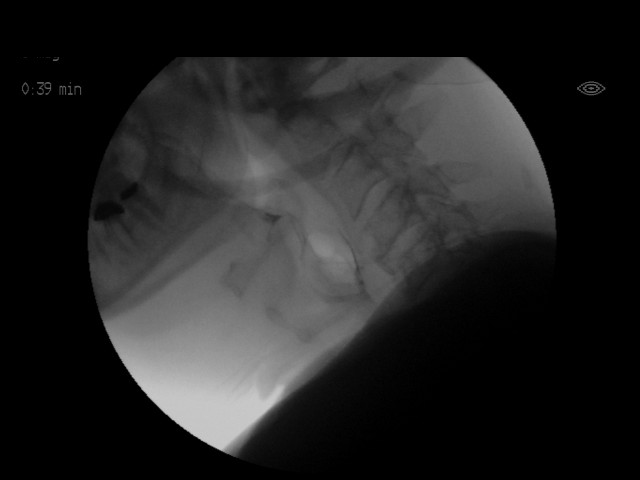
[im 9/20]
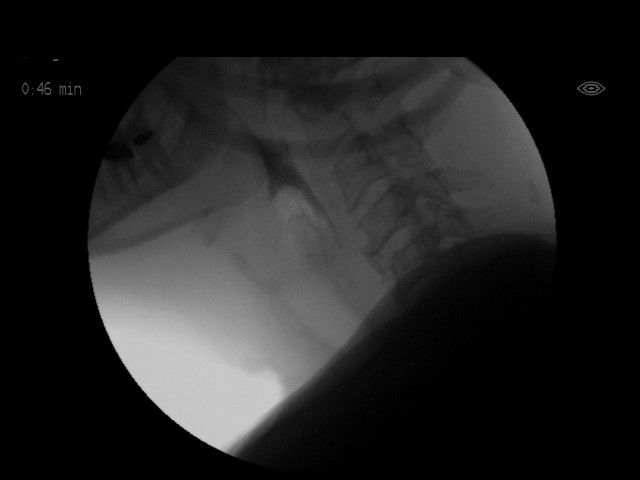
[im 10/20]
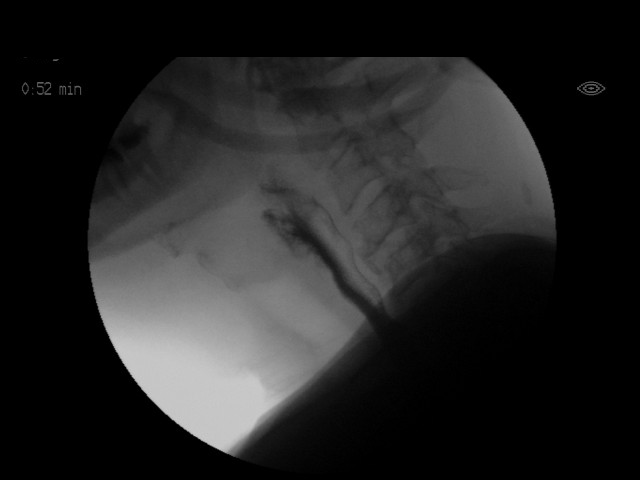
[im 11/20]
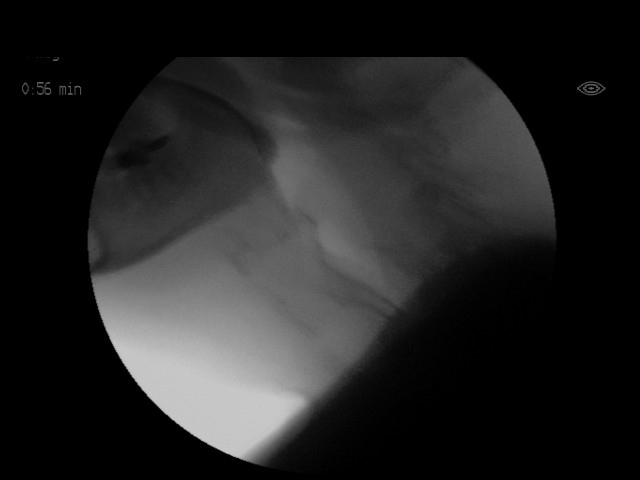
[im 13/20]
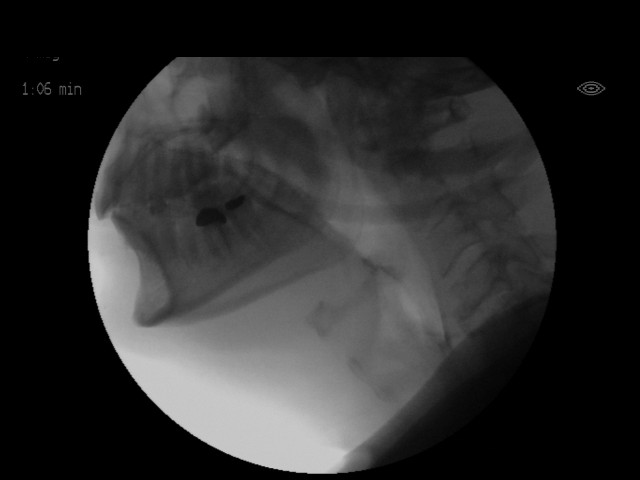
[im 14/20]
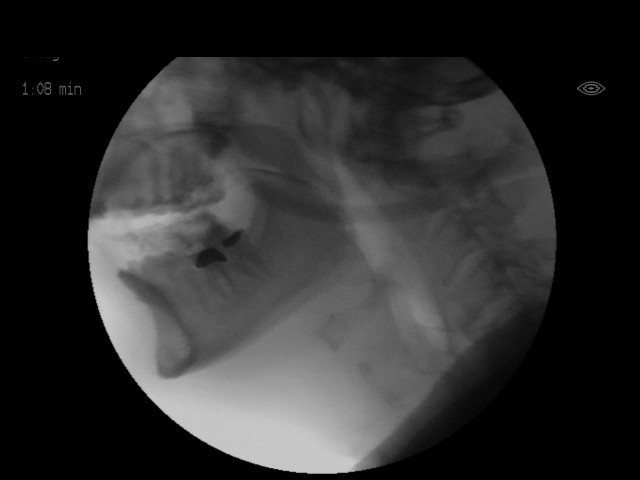
[im 14/20]
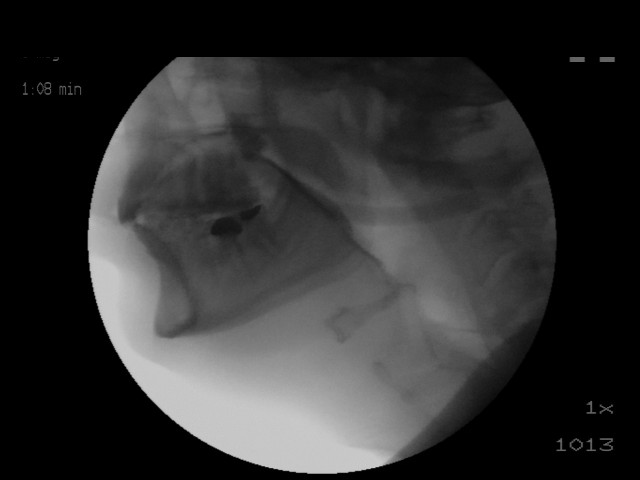
[im 16/20]
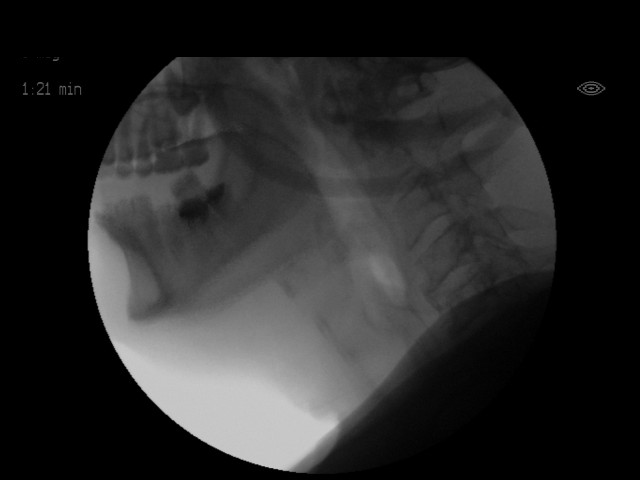
[im 17/20]
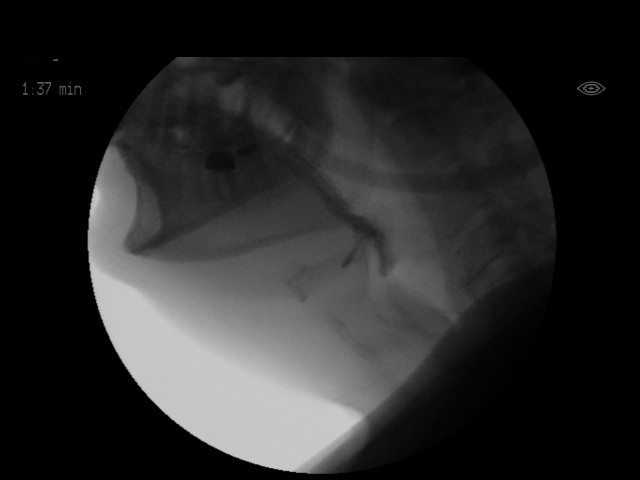
[im 18/20]
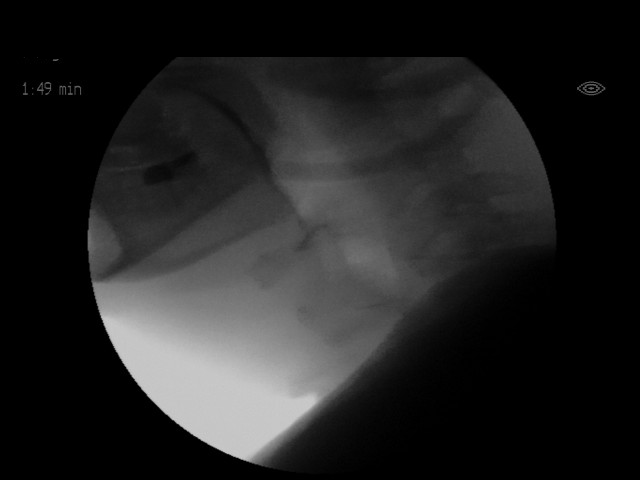
[im 20/20]
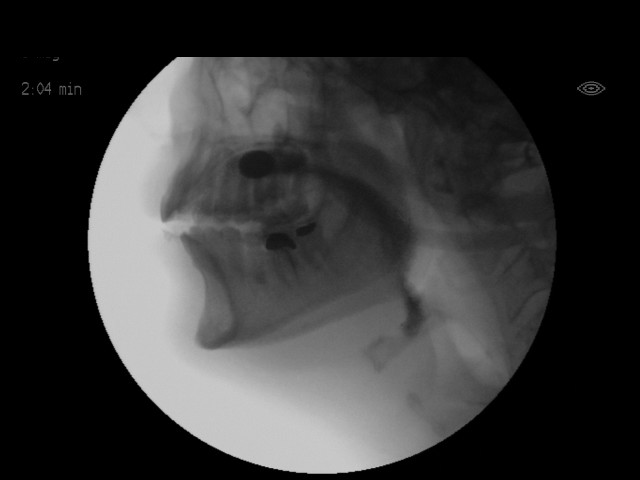
[im 20/20]
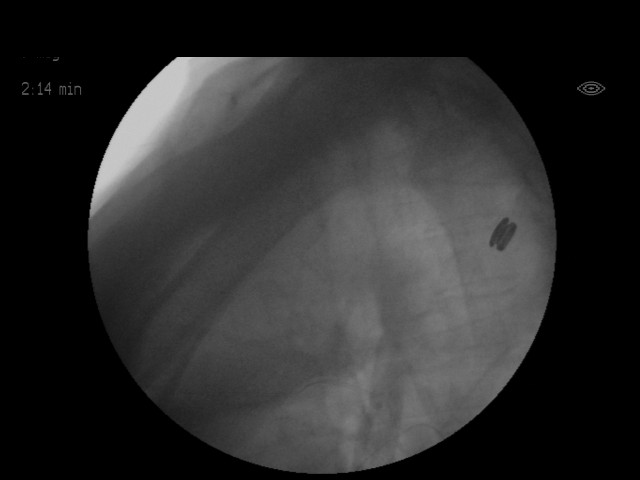

[18 of 24 positions shown; findings below may reference images not displayed]

FLUOROSCOPY FOR SWALLOWING FUNCTION STUDY:
Fluoroscopy was provided for swallowing function study, which was administered by a speech pathologist.  Final results and recommendations from this study are contained within the speech pathology report.

## 2022-08-17 DEATH — deceased
